# Patient Record
Sex: Female | Born: 1945 | Race: White | Hispanic: No | Marital: Married | Smoking: Never smoker
Health system: Southern US, Community
[De-identification: ages and names within clinical notes are randomized; demographics above are authoritative.]

## PROBLEM LIST (undated history)

## (undated) DIAGNOSIS — M8580 Other specified disorders of bone density and structure, unspecified site: Secondary | ICD-10-CM

## (undated) DIAGNOSIS — E785 Hyperlipidemia, unspecified: Secondary | ICD-10-CM

## (undated) DIAGNOSIS — K219 Gastro-esophageal reflux disease without esophagitis: Secondary | ICD-10-CM

## (undated) DIAGNOSIS — K635 Polyp of colon: Secondary | ICD-10-CM

## (undated) HISTORY — DX: Gastro-esophageal reflux disease without esophagitis: K21.9

## (undated) HISTORY — DX: Hyperlipidemia, unspecified: E78.5

## (undated) HISTORY — DX: Polyp of colon: K63.5

## (undated) HISTORY — DX: Other specified disorders of bone density and structure, unspecified site: M85.80

## (undated) HISTORY — PX: COLONOSCOPY W/ POLYPECTOMY: SHX1380

---

## 1988-08-21 HISTORY — PX: VESICOVAGINAL FISTULA CLOSURE W/ TAH: SUR271

## 1996-02-07 ENCOUNTER — Encounter: Payer: Self-pay | Admitting: Internal Medicine

## 1996-02-11 ENCOUNTER — Encounter: Payer: Self-pay | Admitting: Internal Medicine

## 1996-02-11 DIAGNOSIS — K5730 Diverticulosis of large intestine without perforation or abscess without bleeding: Secondary | ICD-10-CM | POA: Insufficient documentation

## 1996-02-11 DIAGNOSIS — K219 Gastro-esophageal reflux disease without esophagitis: Secondary | ICD-10-CM | POA: Insufficient documentation

## 1996-02-11 HISTORY — DX: Gastro-esophageal reflux disease without esophagitis: K21.9

## 1996-02-11 HISTORY — DX: Diverticulosis of large intestine without perforation or abscess without bleeding: K57.30

## 1999-02-28 ENCOUNTER — Other Ambulatory Visit: Admission: RE | Admit: 1999-02-28 | Discharge: 1999-02-28 | Payer: Self-pay | Admitting: Obstetrics & Gynecology

## 1999-12-16 ENCOUNTER — Encounter: Payer: Self-pay | Admitting: Obstetrics & Gynecology

## 1999-12-16 ENCOUNTER — Encounter: Admission: RE | Admit: 1999-12-16 | Discharge: 1999-12-16 | Payer: Self-pay | Admitting: Obstetrics & Gynecology

## 2000-02-20 ENCOUNTER — Encounter: Payer: Self-pay | Admitting: Internal Medicine

## 2000-04-04 ENCOUNTER — Encounter: Payer: Self-pay | Admitting: Internal Medicine

## 2000-04-26 ENCOUNTER — Encounter: Payer: Self-pay | Admitting: Internal Medicine

## 2001-01-01 ENCOUNTER — Encounter: Payer: Self-pay | Admitting: Obstetrics & Gynecology

## 2001-01-01 ENCOUNTER — Encounter: Admission: RE | Admit: 2001-01-01 | Discharge: 2001-01-01 | Payer: Self-pay | Admitting: Obstetrics & Gynecology

## 2001-01-22 ENCOUNTER — Encounter: Payer: Self-pay | Admitting: Internal Medicine

## 2001-01-22 ENCOUNTER — Encounter: Admission: RE | Admit: 2001-01-22 | Discharge: 2001-01-22 | Payer: Self-pay | Admitting: *Deleted

## 2001-02-06 ENCOUNTER — Ambulatory Visit: Admission: RE | Admit: 2001-02-06 | Discharge: 2001-02-06 | Payer: Self-pay | Admitting: Internal Medicine

## 2001-12-23 ENCOUNTER — Encounter: Payer: Self-pay | Admitting: Internal Medicine

## 2001-12-23 ENCOUNTER — Encounter: Admission: RE | Admit: 2001-12-23 | Discharge: 2001-12-23 | Payer: Self-pay | Admitting: Internal Medicine

## 2002-01-06 ENCOUNTER — Encounter: Admission: RE | Admit: 2002-01-06 | Discharge: 2002-01-06 | Payer: Self-pay | Admitting: Obstetrics & Gynecology

## 2002-01-06 ENCOUNTER — Encounter: Payer: Self-pay | Admitting: Obstetrics & Gynecology

## 2003-01-12 ENCOUNTER — Encounter: Payer: Self-pay | Admitting: Obstetrics & Gynecology

## 2003-01-12 ENCOUNTER — Encounter: Admission: RE | Admit: 2003-01-12 | Discharge: 2003-01-12 | Payer: Self-pay | Admitting: Obstetrics & Gynecology

## 2004-06-15 ENCOUNTER — Other Ambulatory Visit: Admission: RE | Admit: 2004-06-15 | Discharge: 2004-06-15 | Payer: Self-pay | Admitting: Obstetrics & Gynecology

## 2004-07-22 ENCOUNTER — Ambulatory Visit: Payer: Self-pay | Admitting: Internal Medicine

## 2004-08-05 ENCOUNTER — Ambulatory Visit: Payer: Self-pay | Admitting: Internal Medicine

## 2004-08-18 ENCOUNTER — Ambulatory Visit: Payer: Self-pay | Admitting: Internal Medicine

## 2004-12-27 ENCOUNTER — Ambulatory Visit: Payer: Self-pay | Admitting: Internal Medicine

## 2005-02-07 ENCOUNTER — Ambulatory Visit: Payer: Self-pay | Admitting: Internal Medicine

## 2005-02-15 ENCOUNTER — Ambulatory Visit: Payer: Self-pay | Admitting: Internal Medicine

## 2005-03-28 ENCOUNTER — Ambulatory Visit: Payer: Self-pay | Admitting: Internal Medicine

## 2005-04-26 ENCOUNTER — Ambulatory Visit: Payer: Self-pay | Admitting: Internal Medicine

## 2005-12-08 ENCOUNTER — Ambulatory Visit: Payer: Self-pay | Admitting: Internal Medicine

## 2005-12-13 ENCOUNTER — Ambulatory Visit: Payer: Self-pay | Admitting: Internal Medicine

## 2006-02-07 ENCOUNTER — Ambulatory Visit: Payer: Self-pay | Admitting: Internal Medicine

## 2006-02-12 ENCOUNTER — Ambulatory Visit: Payer: Self-pay | Admitting: Internal Medicine

## 2006-04-09 ENCOUNTER — Ambulatory Visit: Payer: Self-pay | Admitting: Internal Medicine

## 2006-04-10 ENCOUNTER — Ambulatory Visit: Payer: Self-pay | Admitting: Internal Medicine

## 2006-05-22 ENCOUNTER — Ambulatory Visit: Payer: Self-pay | Admitting: Internal Medicine

## 2007-01-02 ENCOUNTER — Encounter: Payer: Self-pay | Admitting: Internal Medicine

## 2007-01-02 ENCOUNTER — Ambulatory Visit: Payer: Self-pay | Admitting: Internal Medicine

## 2007-02-01 ENCOUNTER — Telehealth: Payer: Self-pay | Admitting: Internal Medicine

## 2007-02-04 ENCOUNTER — Ambulatory Visit: Payer: Self-pay | Admitting: Internal Medicine

## 2007-02-05 ENCOUNTER — Encounter: Payer: Self-pay | Admitting: Internal Medicine

## 2007-02-06 ENCOUNTER — Encounter: Payer: Self-pay | Admitting: Internal Medicine

## 2007-03-13 ENCOUNTER — Ambulatory Visit: Payer: Self-pay | Admitting: Internal Medicine

## 2007-05-09 ENCOUNTER — Ambulatory Visit: Payer: Self-pay | Admitting: Internal Medicine

## 2007-05-16 ENCOUNTER — Telehealth (INDEPENDENT_AMBULATORY_CARE_PROVIDER_SITE_OTHER): Payer: Self-pay | Admitting: *Deleted

## 2007-09-12 ENCOUNTER — Telehealth (INDEPENDENT_AMBULATORY_CARE_PROVIDER_SITE_OTHER): Payer: Self-pay | Admitting: *Deleted

## 2007-09-20 ENCOUNTER — Ambulatory Visit (HOSPITAL_COMMUNITY): Admission: RE | Admit: 2007-09-20 | Discharge: 2007-09-20 | Payer: Self-pay | Admitting: Internal Medicine

## 2007-09-27 ENCOUNTER — Telehealth (INDEPENDENT_AMBULATORY_CARE_PROVIDER_SITE_OTHER): Payer: Self-pay | Admitting: *Deleted

## 2007-10-08 ENCOUNTER — Ambulatory Visit: Payer: Self-pay | Admitting: Internal Medicine

## 2007-11-18 ENCOUNTER — Ambulatory Visit: Payer: Self-pay | Admitting: Internal Medicine

## 2007-11-28 ENCOUNTER — Telehealth (INDEPENDENT_AMBULATORY_CARE_PROVIDER_SITE_OTHER): Payer: Self-pay | Admitting: *Deleted

## 2008-04-30 ENCOUNTER — Encounter: Payer: Self-pay | Admitting: Internal Medicine

## 2008-05-18 ENCOUNTER — Encounter: Payer: Self-pay | Admitting: Internal Medicine

## 2008-05-18 ENCOUNTER — Ambulatory Visit: Payer: Self-pay | Admitting: Family Medicine

## 2008-06-16 ENCOUNTER — Encounter (INDEPENDENT_AMBULATORY_CARE_PROVIDER_SITE_OTHER): Payer: Self-pay | Admitting: *Deleted

## 2008-07-29 ENCOUNTER — Ambulatory Visit: Payer: Self-pay | Admitting: Internal Medicine

## 2008-11-12 ENCOUNTER — Ambulatory Visit: Payer: Self-pay | Admitting: Internal Medicine

## 2008-11-17 ENCOUNTER — Telehealth (INDEPENDENT_AMBULATORY_CARE_PROVIDER_SITE_OTHER): Payer: Self-pay | Admitting: *Deleted

## 2008-11-26 ENCOUNTER — Ambulatory Visit: Payer: Self-pay | Admitting: Internal Medicine

## 2008-11-29 LAB — CONVERTED CEMR LAB
Cholesterol: 211 mg/dL — ABNORMAL HIGH (ref 0–200)
HDL: 59.8 mg/dL (ref >39.00)
Triglycerides: 75 mg/dL (ref 0.0–149.0)
VLDL: 15 mg/dL (ref 0.0–40.0)

## 2008-11-30 ENCOUNTER — Encounter (INDEPENDENT_AMBULATORY_CARE_PROVIDER_SITE_OTHER): Payer: Self-pay | Admitting: *Deleted

## 2009-04-02 ENCOUNTER — Telehealth (INDEPENDENT_AMBULATORY_CARE_PROVIDER_SITE_OTHER): Payer: Self-pay | Admitting: *Deleted

## 2009-06-11 ENCOUNTER — Encounter: Payer: Self-pay | Admitting: Internal Medicine

## 2009-06-14 ENCOUNTER — Telehealth: Payer: Self-pay | Admitting: Internal Medicine

## 2009-06-15 ENCOUNTER — Telehealth (INDEPENDENT_AMBULATORY_CARE_PROVIDER_SITE_OTHER): Payer: Self-pay | Admitting: *Deleted

## 2009-07-01 ENCOUNTER — Encounter: Payer: Self-pay | Admitting: Internal Medicine

## 2009-07-06 ENCOUNTER — Encounter (INDEPENDENT_AMBULATORY_CARE_PROVIDER_SITE_OTHER): Payer: Self-pay | Admitting: *Deleted

## 2009-07-12 ENCOUNTER — Ambulatory Visit (HOSPITAL_COMMUNITY): Admission: RE | Admit: 2009-07-12 | Discharge: 2009-07-12 | Payer: Self-pay | Admitting: General Surgery

## 2009-08-02 ENCOUNTER — Ambulatory Visit (HOSPITAL_COMMUNITY): Admission: RE | Admit: 2009-08-02 | Discharge: 2009-08-02 | Payer: Self-pay | Admitting: General Surgery

## 2009-08-02 ENCOUNTER — Encounter: Payer: Self-pay | Admitting: Internal Medicine

## 2009-08-02 ENCOUNTER — Ambulatory Visit: Payer: Self-pay | Admitting: Internal Medicine

## 2009-08-02 DIAGNOSIS — M8580 Other specified disorders of bone density and structure, unspecified site: Secondary | ICD-10-CM | POA: Insufficient documentation

## 2009-08-02 DIAGNOSIS — I10 Essential (primary) hypertension: Secondary | ICD-10-CM

## 2009-08-02 DIAGNOSIS — E785 Hyperlipidemia, unspecified: Secondary | ICD-10-CM | POA: Insufficient documentation

## 2009-08-02 DIAGNOSIS — R1013 Epigastric pain: Secondary | ICD-10-CM | POA: Insufficient documentation

## 2009-08-02 HISTORY — DX: Essential (primary) hypertension: I10

## 2009-08-21 HISTORY — PX: UPPER GASTROINTESTINAL ENDOSCOPY: SHX188

## 2009-09-07 ENCOUNTER — Telehealth (INDEPENDENT_AMBULATORY_CARE_PROVIDER_SITE_OTHER): Payer: Self-pay | Admitting: *Deleted

## 2009-09-16 ENCOUNTER — Telehealth: Payer: Self-pay | Admitting: Internal Medicine

## 2010-01-20 ENCOUNTER — Ambulatory Visit: Payer: Self-pay | Admitting: Internal Medicine

## 2010-03-08 ENCOUNTER — Encounter: Payer: Self-pay | Admitting: Internal Medicine

## 2010-04-15 ENCOUNTER — Encounter (INDEPENDENT_AMBULATORY_CARE_PROVIDER_SITE_OTHER): Payer: Self-pay | Admitting: *Deleted

## 2010-04-20 ENCOUNTER — Ambulatory Visit: Payer: Self-pay | Admitting: Internal Medicine

## 2010-05-05 ENCOUNTER — Ambulatory Visit: Payer: Self-pay | Admitting: Internal Medicine

## 2010-05-09 ENCOUNTER — Telehealth (INDEPENDENT_AMBULATORY_CARE_PROVIDER_SITE_OTHER): Payer: Self-pay | Admitting: *Deleted

## 2010-05-12 ENCOUNTER — Encounter: Payer: Self-pay | Admitting: Internal Medicine

## 2010-05-18 ENCOUNTER — Ambulatory Visit: Payer: Self-pay | Admitting: Internal Medicine

## 2010-05-18 LAB — CONVERTED CEMR LAB
OCCULT 1: NEGATIVE
OCCULT 2: NEGATIVE
OCCULT 3: NEGATIVE

## 2010-05-19 ENCOUNTER — Encounter (INDEPENDENT_AMBULATORY_CARE_PROVIDER_SITE_OTHER): Payer: Self-pay | Admitting: *Deleted

## 2010-05-19 LAB — CONVERTED CEMR LAB
Amylase: 56 units/L (ref 27–131)
Basophils Relative: 0.9 % (ref 0.0–3.0)
Bilirubin, Direct: 0.1 mg/dL (ref 0.0–0.3)
Eosinophils Absolute: 0.2 10*3/uL (ref 0.0–0.7)
H Pylori IgG: NEGATIVE
HCT: 42.1 % (ref 36.0–46.0)
Hemoglobin: 14.2 g/dL (ref 12.0–15.0)
Lipase: 42 units/L (ref 11.0–59.0)
Lymphocytes Relative: 34.1 % (ref 12.0–46.0)
MCHC: 33.8 g/dL (ref 30.0–36.0)
MCV: 97.5 fL (ref 78.0–100.0)
Neutro Abs: 2.3 10*3/uL (ref 1.4–7.7)
RBC: 4.32 M/uL (ref 3.87–5.11)
Total Bilirubin: 0.9 mg/dL (ref 0.3–1.2)
Total Protein: 6.8 g/dL (ref 6.0–8.3)

## 2010-06-29 ENCOUNTER — Ambulatory Visit: Payer: Self-pay | Admitting: Internal Medicine

## 2010-06-29 DIAGNOSIS — R1310 Dysphagia, unspecified: Secondary | ICD-10-CM | POA: Insufficient documentation

## 2010-06-30 ENCOUNTER — Ambulatory Visit: Payer: Self-pay | Admitting: Internal Medicine

## 2010-06-30 DIAGNOSIS — H6980 Other specified disorders of Eustachian tube, unspecified ear: Secondary | ICD-10-CM | POA: Insufficient documentation

## 2010-07-01 ENCOUNTER — Telehealth: Payer: Self-pay | Admitting: Internal Medicine

## 2010-07-06 ENCOUNTER — Encounter: Payer: Self-pay | Admitting: Internal Medicine

## 2010-07-06 ENCOUNTER — Telehealth: Payer: Self-pay | Admitting: Internal Medicine

## 2010-07-19 ENCOUNTER — Ambulatory Visit: Payer: Self-pay | Admitting: Internal Medicine

## 2010-07-20 ENCOUNTER — Telehealth: Payer: Self-pay | Admitting: Internal Medicine

## 2010-08-26 ENCOUNTER — Ambulatory Visit
Admission: RE | Admit: 2010-08-26 | Discharge: 2010-08-26 | Payer: Self-pay | Source: Home / Self Care | Attending: Internal Medicine | Admitting: Internal Medicine

## 2010-09-13 ENCOUNTER — Ambulatory Visit
Admission: RE | Admit: 2010-09-13 | Discharge: 2010-09-13 | Payer: Self-pay | Source: Home / Self Care | Attending: Internal Medicine | Admitting: Internal Medicine

## 2010-09-13 DIAGNOSIS — J069 Acute upper respiratory infection, unspecified: Secondary | ICD-10-CM | POA: Insufficient documentation

## 2010-09-20 ENCOUNTER — Telehealth: Payer: Self-pay | Admitting: Internal Medicine

## 2010-09-22 NOTE — Procedures (Signed)
Summary: EGD   EGD  Procedure date:  12/13/2005  Findings:      Location: [facility]  Findings: Esophagitis  GERD  EGD  Procedure date:  12/13/2005  Findings:      Location: [facility]  Findings: Esophagitis  GERD Patient Name: Ruth Jones, Ruth Jones MRN:  Procedure Procedures: Panendoscopy (EGD) CPT: 43235.  Personnel: Endoscopist: Wilhemina Bonito. Marina Goodell, MD.  Exam Location: Exam performed in Outpatient Clinic. Outpatient  Patient Consent: Procedure, Alternatives, Risks and Benefits discussed, consent obtained, from patient. Consent was obtained by the RN.  Indications Symptoms: Pulmonary symptoms, including:  Hoarseness. Abdominal pain, location: epigastric. Reflux symptoms  Comments: Symptoms of epigastric burning at night and intermittent hoarseness despite bid Nexium and reflux precautions History  Current Medications: Patient is not currently taking Coumadin.  Pre-Exam Physical: Performed Dec 13, 2005  Cardio-pulmonary exam, Abdominal exam, Mental status exam WNL.  Comments: Pt. history reviewed/updated, physical exam performed prior to initiation of sedation?yes Exam Exam Info: Maximum depth of insertion Duodenum, intended Duodenum. Patient position: on left side. Vocal cords visualized. Gastric retroflexion performed. Images taken. ASA Classification: II. Tolerance: excellent.  Sedation Meds: Patient assessed and found to be appropriate for moderate (conscious) sedation. Fentanyl 50 mcg. given IV. Versed 5 mg. given IV. Cetacaine Spray given aerosolized.  Monitoring: BP and pulse monitoring done. Oximetry used. Supplemental O2 given  Findings - Normal: Proximal Esophagus to Distal Esophagus.  ESOPHAGEAL INFLAMMATION: as a result of reflux. Severity is mild, erythema only.  ICD9: Esophagitis, Reflux: 530.11.  - Normal: Fundus to Duodenal 2nd Portion. Comments: small H/H.   Assessment  Diagnoses: 530.11: Esophagitis, Reflux.    530.81: GERD.   Events  Unplanned Intervention: No unplanned interventions were required.  Unplanned Events: There were no complications. Plans Medication(s): PPI: Esomeprazole/Nexium 40 mg BID,  H2Blocker: Ranitidine/Zantac 300 mg HS,   Disposition: After procedure patient sent to recovery. After recovery patient sent home.  Scheduling: Call office for appointment, to Wilhemina Bonito. Marina Goodell, MD, in about 6 weeks   Comments: If pain worsens in interim,would schedule abdominal ultrasound  This report was created from the original endoscopy report, which was reviewed and signed by the above listed endoscopist.   cc:  [name], MD      The Patient

## 2010-09-22 NOTE — Progress Notes (Signed)
Summary: Triage  Phone Note Call from Patient   Caller: Bernie  Dr. Johnna Acosta [phone] Call For: Dr. Marina Goodell Reason for Call: Talk to Nurse Summary of Call: Needs to discuss the Esophageal Manometry on this patient Initial call taken by: Karna Christmas,  September 16, 2009 9:30 AM  Follow-up for Phone Call        Copy of interpretation faxed yesterday and Cyndra Numbers just now checked her box and it is there so she will give it toi Dr.Rosenbower. Follow-up by: Teryl Lucy RN,  September 16, 2009 10:27 AM

## 2010-09-22 NOTE — Procedures (Signed)
Summary: EGD/[facility]  EGD/[facility]   Imported By: Sherian Rein 06/30/2010 11:45:59  _____________________________________________________________________  External Attachment:    Type:   Image     Comment:   External Document

## 2010-09-22 NOTE — Procedures (Signed)
Summary: EGD/[facility]  EGD/[facility]   Imported By: Sherian Rein 06/30/2010 11:53:27  _____________________________________________________________________  External Attachment:    Type:   Image     Comment:   External Document

## 2010-09-22 NOTE — Progress Notes (Signed)
Summary: Prednisone rx  Phone Note Call from Patient Call back at Home Phone [phone]   Summary of Call: Patient called stating that she did not get her prednisone prescription from yesterday. It was to be faxed but the pharmacy denies receiving it. Patient aware re-faxed for her. Initial call taken by: Lucious Groves CMA,  July 01, 2010 9:38 AM    Prescriptions: PREDNISONE 20 MG TABS (PREDNISONE) 1 two times a day with food  #10 x 0   Entered by:   Lucious Groves CMA   Authorized by:   [name] MD   Signed by:   Lucious Groves CMA on 07/01/2010   Method used:   Electronically to        [id number] (retail)       [address]       [place], [place]  [address]       Ph: [phone]       Fax: [phone]   RxID:   [id number]

## 2010-09-22 NOTE — Assessment & Plan Note (Signed)
Summary: SINUS INFECTION/KN   Vital Signs:  Patient profile:   65 year old female Weight:      120.8 pounds BMI:     22.35 Temp:     98.1 degrees F oral Pulse rate:   72 / minute Resp:     14 per minute BP sitting:   110 / 68  (left arm) Cuff size:   regular  Vitals Entered By: [name] CMA (August 26, 2010 4:19 PM) CC: 1.)Sinus Infection    2.) Ongoing ear concerns , URI symptoms   Primary Care Provider:  [name], MD  CC:  1.)Sinus Infection    2.) Ongoing ear concerns  and URI symptoms.  History of Present Illness: Intermittent laryngitis in late 07/2010 followed by frontal & anterior crown headaches in am, better with Neti pot.Nasal secretions exhibit purulence  since 08/22/2010. The patient now  reports nasal congestion and productive cough, but denies sore throat and earache.  The patient denies fever, dyspnea, and wheezing.  The patient denies the following risk factors for Strep sinusitis:   facial pain, tooth pain, and tender adenopathy.   Current Medications (verified): 1)  Solgar Calmag With Vit D .... Take As Directed 2)  One-A-Day Womens Formula  Tabs (Multiple Vitamins-Calcium) .... Take 1 Tablet By Mouth Once A Day 3)  Calcium Citrate 200 Mg Tabs (Calcium Citrate) .... Take 7 Tabs Daily (In Divided Doses) 4)  Vivelle-Dot 0.0375 Mg/24hr Pttw (Estradiol) .... Remove and Apply New Patch Twice A Week 5)  Align  Caps (Probiotic Product) .... Take 1 Capsule By Mouth Once A Day 6)  Dexilant 60 Mg Cpdr (Dexlansoprazole) .Marland Kitchen.. 1 Each Am 7)  Losartan Potassium 50 Mg Tabs (Losartan Potassium) .Marland Kitchen.. 1 Once Daily 8)  Vitamin D 1000 Unit Tabs (Cholecalciferol) .... Take 1 Tablet By Mouth Once A Day 9)  Glucosamine 1500 Complex  Caps (Glucosamine-Chondroit-Vit C-Mn) .... Take 1 Capsule By Mouth Once A Day 10)  Nasonex 50 Mcg/act Susp (Mometasone Furoate) .Marland Kitchen.. 1 Spray Two Times A Day As Needed  Allergies: 1)  ! Levaquin 2)  ! * Toprol Xl 3)  ! Sulfa 4)  !  Pcn  Physical Exam  General:  well-nourished,in no acute distress; alert,appropriate and cooperative throughout examination Ears:  External ear exam shows no significant lesions or deformities.  Otoscopic examination reveals clear canals, tympanic membranes are intact bilaterally without bulging, retraction, inflammation or discharge but they are dull, L > R. Hearing is grossly normal bilaterally. Nose:  External nasal examination shows no deformity or inflammation. Nasal mucosa are  dry & erythematous  without lesions or exudates. Septum deviated to L. Hyponasal speech Mouth:  Oral mucosa and oropharynx without lesions or exudates.  Teeth in good repair. Lungs:  Normal respiratory effort, chest expands symmetrically. Lungs are clear to auscultation, no crackles or wheezes. Cervical Nodes:  No lymphadenopathy noted Axillary Nodes:  No palpable lymphadenopathy   Impression & Recommendations:  Problem # 1:  SINUSITIS- ACUTE-NOS (ICD-461.9)  The following medications were removed from the medication list:    Nasonex 50 Mcg/act Susp (Mometasone furoate) .Marland Kitchen... 1 spray two times a day as needed Her updated medication list for this problem includes:    Cefuroxime Axetil 500 Mg Tabs (Cefuroxime axetil) .Marland Kitchen... 1 two times a day    Fluticasone Propionate 50 Mcg/act Susp (Fluticasone propionate) .Marland Kitchen... 1 spray once daily - two times a day as needed  Problem # 2:  BRONCHITIS-ACUTE (ICD-466.0)  Her updated medication list for  this problem includes:    Cefuroxime Axetil 500 Mg Tabs (Cefuroxime axetil) .Marland Kitchen... 1 two times a day  Complete Medication List: 1)  Solgar Calmag With Vit D  .... Take as directed 2)  One-a-day Womens Formula Tabs (Multiple vitamins-calcium) .... Take 1 tablet by mouth once a day 3)  Calcium Citrate 200 Mg Tabs (Calcium citrate) .... Take 7 tabs daily (in divided doses) 4)  Vivelle-dot 0.0375 Mg/24hr Pttw (Estradiol) .... Remove and apply new patch twice a week 5)  Align Caps  (Probiotic product) .... Take 1 capsule by mouth once a day 6)  Dexilant 60 Mg Cpdr (Dexlansoprazole) .Marland Kitchen.. 1 each am 7)  Losartan Potassium 50 Mg Tabs (Losartan potassium) .Marland Kitchen.. 1 once daily 8)  Vitamin D 1000 Unit Tabs (Cholecalciferol) .... Take 1 tablet by mouth once a day 9)  Glucosamine 1500 Complex Caps (Glucosamine-chondroit-vit c-mn) .... Take 1 capsule by mouth once a day 10)  Cefuroxime Axetil 500 Mg Tabs (Cefuroxime axetil) .Marland Kitchen.. 1 two times a day 11)  Fluticasone Propionate 50 Mcg/act Susp (Fluticasone propionate) .Marland Kitchen.. 1 spray once daily - two times a day as needed  Patient Instructions: 1)  Neti pot once daily- two times a day as needed  followed by generic Flonase. 2)  Drink as much NON dairy  fluid as you can tolerate for the next few days. Prescriptions: FLUTICASONE PROPIONATE 50 MCG/ACT SUSP (FLUTICASONE PROPIONATE) 1 spray once daily - two times a day as needed  #1 x 11   Entered and Authorized by:   [name] MD   Signed by:   [name] MD on 08/26/2010   Method used:   Print then Give to Patient   RxID:   [id number] CEFUROXIME AXETIL 500 MG TABS (CEFUROXIME AXETIL) 1 two times a day  #20 x 0   Entered and Authorized by:   [name] MD   Signed by:   [name] MD on 08/26/2010   Method used:   Electronically to        [id number] (retail)       [address]       [place], [place]  [address]       Ph: [phone]       Fax: [phone]   RxID:   [id number]    Orders Added: 1)  Est. Patient Level III [[address]]

## 2010-09-22 NOTE — Letter (Signed)
Summary: Colonoscopy Letter  [facility]  [address] [place], [place] [address]   Phone: [phone]  Fax: [phone]      March 08, 2010 MRN: [id number]   STELLAR GENSEL [address] [place], [place]  [address]   Dear Ms. Merk,   According to your medical record, it is time for you to schedule a Colonoscopy. The American Cancer Society recommends this procedure as a method to detect early colon cancer. Patients with a family history of colon cancer, or a personal history of colon polyps or inflammatory bowel disease are at increased risk.  This letter has been generated based on the recommendations made at the time of your procedure. If you feel that in your particular situation this may no longer apply, please contact our office.  Please call our office at [phone] to schedule this appointment or to update your records at your earliest convenience.  Thank you for cooperating with [place] to provide you with the very best care possible.   Sincerely,  Wilhemina Bonito. Marina Goodell, M.D.  [facility] [phone]

## 2010-09-22 NOTE — Miscellaneous (Signed)
Summary: [facility] Previsit/prep  Clinical Lists Changes  Medications: Added new medication of MOVIPREP 100 GM  SOLR (PEG-KCL-NACL-NASULF-NA ASC-C) As per prep instructions. - Signed Rx of MOVIPREP 100 GM  SOLR (PEG-KCL-NACL-NASULF-NA ASC-C) As per prep instructions.;  #1 x 0;  Signed;  Entered by: Wyona Almas RN;  Authorized by: Hilarie Fredrickson MD;  Method used: Electronically to [facility] [phone]*, [address] [place], [place], [place]  [address], Ph: [phone] or [phone], Fax: [phone] Allergies: Changed allergy or adverse reaction from [facility] to [facility] Changed allergy or adverse reaction from * TOPROL XL to * TOPROL XL Changed allergy or adverse reaction from SULFA to SULFA Observations: Added new observation of ALLERGY REV: Done (04/20/2010 15:59)    Prescriptions: MOVIPREP 100 GM  SOLR (PEG-KCL-NACL-NASULF-NA ASC-C) As per prep instructions.  #1 x 0   Entered by:   Wyona Almas RN   Authorized by:   Hilarie Fredrickson MD   Signed by:   Wyona Almas RN on 04/20/2010   Method used:   Electronically to        [facility] (retail)       [address]       [place], [place]  [address]       Ph: [phone] or [phone]       Fax: [phone]   RxID:   [id number]

## 2010-09-22 NOTE — Letter (Signed)
Summary: [facility] Instructions  [facility]  [address] [place], [place] [address]   Phone: [phone]  Fax: [phone]       Ruth Jones    05-04-1946    MRN: [id number]        Procedure Day /Date: Thursday   05-05-10     Arrival Time:  7:30 a.m.     Procedure Time:  8:30 a.m.     Location of Procedure:                    _x_  [facility] (4th Floor)                        PREPARATION FOR COLONOSCOPY WITH MOVIPREP   Starting 5 days prior to your procedure  04-30-10  do not eat nuts, seeds, popcorn, corn, beans, peas,  salads, or any raw vegetables.  Do not take any fiber supplements (e.g. Metamucil, Citrucel, and Benefiber).  THE DAY BEFORE YOUR PROCEDURE         DATE:  05-04-10  DAY: Wednesday  1.  Drink clear liquids the entire day-NO SOLID FOOD  2.  Do not drink anything colored red or purple.  Avoid juices with pulp.  No orange juice.  3.  Drink at least 64 oz. (8 glasses) of fluid/clear liquids during the day to prevent dehydration and help the prep work efficiently.  CLEAR LIQUIDS INCLUDE: Water Jello Ice Popsicles Tea (sugar ok, no milk/cream) Powdered fruit flavored drinks Coffee (sugar ok, no milk/cream) Gatorade Juice: apple, white grape, white cranberry  Lemonade Clear bullion, consomm, broth Carbonated beverages (any kind) Strained chicken noodle soup Hard Candy                             4.  In the morning, mix first dose of MoviPrep solution:    Empty 1 Pouch A and 1 Pouch B into the disposable container    Add lukewarm drinking water to the top line of the container. Mix to dissolve    Refrigerate (mixed solution should be used within 24 hrs)  5.  Begin drinking the prep at 5:00 p.m. The MoviPrep container is divided by 4 marks.   Every 15 minutes drink the solution down to the next mark (approximately 8 oz) until the full liter is complete.   6.  Follow completed prep with 16 oz of clear liquid of your  choice (Nothing red or purple).  Continue to drink clear liquids until bedtime.  7.  Before going to bed, mix second dose of MoviPrep solution:    Empty 1 Pouch A and 1 Pouch B into the disposable container    Add lukewarm drinking water to the top line of the container. Mix to dissolve    Refrigerate  THE DAY OF YOUR PROCEDURE      DATE:  05-05-10  DAY: Thursday  Beginning at 3:30 a.m. (5 hours before procedure):         1. Every 15 minutes, drink the solution down to the next mark (approx 8 oz) until the full liter is complete.  2. Follow completed prep with 16 oz. of clear liquid of your choice.    3. You may drink clear liquids until  6:30 a.m. (2 HOURS BEFORE PROCEDURE).   MEDICATION INSTRUCTIONS  Unless otherwise instructed, you should take regular prescription medications with a small sip of water  as early as possible the morning of your procedure.       OTHER INSTRUCTIONS  You will need a responsible adult at least 67 years of age to accompany you and drive you home.   This person must remain in the waiting room during your procedure.  Wear loose fitting clothing that is easily removed.  Leave jewelry and other valuables at home.  However, you may wish to bring a book to read or  an iPod/MP3 player to listen to music as you wait for your procedure to start.  Remove all body piercing jewelry and leave at home.  Total time from sign-in until discharge is approximately 2-3 hours.  You should go home directly after your procedure and rest.  You can resume normal activities the  day after your procedure.  The day of your procedure you should not:   Drive   Make legal decisions   Operate machinery   Drink alcohol   Return to work  You will receive specific instructions about eating, activities and medications before you leave.    The above instructions have been reviewed and explained to me by   Wyona Almas RN  April 20, 2010 4:46 PM     I  fully understand and can verbalize these instructions _____________________________ Date _________

## 2010-09-22 NOTE — Assessment & Plan Note (Signed)
Summary: chest congestion//lch   Vital Signs:  Patient profile:   65 year old female Weight:      124.6 pounds Temp:     98.7 degrees F oral Pulse rate:   76 / minute Resp:     17 per minute BP sitting:   120 / 70  (left arm) Cuff size:   regular  Vitals Entered By: [name] (January 20, 2010 4:44 PM) CC: Cough and congestion, seen at Urgent care and rx'ed cough med and Azithromycin 500mg  (3 tabs). Patient is no better, congestion still discolored Comments REVIEWED MED LIST, PATIENT AGREED DOSE AND INSTRUCTION CORRECT    CC:  Cough and congestion, seen at Urgent care and rx'ed cough med and Azithromycin 500mg  (3 tabs). Patient is no better, and congestion still discolored.  History of Present Illness: Despite Neti pot two times a day & course of Azithromycin & continued use of cough syrup  she has purulence from head.  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  Allergies: 1)  ! Levaquin 2)  ! * Toprol Xl 3)  ! Sulfa 4)  ! Pcn  Review of Systems General:  Denies chills, fever, and sweats. ENT:  Complains of postnasal drainage; denies ear discharge, earache, nasal congestion, and sinus pressure; No frontal headache, facila pain but green -yellow secretions. Resp:  Complains of cough and sputum productive; denies chest pain with inspiration, coughing up blood, pleuritic, shortness of breath, and wheezing; No PMH of asthma. Allergy:  Denies itching eyes and sneezing.  Physical Exam  General:  in no acute distress; alert,appropriate and cooperative throughout examination Ears:  External ear exam shows no significant lesions or deformities.  Otoscopic examination reveals clear canals, tympanic membranes are intact bilaterally without bulging, retraction, inflammation or discharge. Hearing is grossly normal bilaterally. Nose:  External nasal examination shows no deformity or inflammation. Nasal mucosa are  mildly erythematous & edematous L > R , without lesions or exudates. Mouth:  Oral  mucosa and oropharynx without lesions or exudates.  Teeth in good repair. Lungs:  Normal respiratory effort, chest expands symmetrically. Lungs are clear to auscultation, no crackles or wheezes,minimal rales. Heart:  regular rhythm, no murmur, no gallop, no rub, no JVD, and bradycardia.  S4 Cervical Nodes:  No lymphadenopathy noted Axillary Nodes:  No palpable lymphadenopathy   Impression & Recommendations:  Problem # 1:  SINUSITIS- ACUTE-NOS (ICD-461.9)  Her updated medication list for this problem includes:    Cefuroxime Axetil 500 Mg Tabs (Cefuroxime axetil) .Marland Kitchen... 1 two times a day  Problem # 2:  BRONCHITIS-ACUTE (ICD-466.0)  Her updated medication list for this problem includes:    Cefuroxime Axetil 500 Mg Tabs (Cefuroxime axetil) .Marland Kitchen... 1 two times a day    Advair Diskus 100-50 Mcg/dose Aepb (Fluticasone-salmeterol) .Marland Kitchen... 1 inhalation every 12 hrs ; gargle & spit after use  Complete Medication List: 1)  Altace 10 Mg Caps (Ramipril) .Marland Kitchen.. 1 by mouth qd 2)  Prevacid 30 Mg Cpdr (Lansoprazole) .Marland Kitchen.. 1 by mouth once daily 3)  Solgar Calmag With Vit D  .... Take as directed 4)  Mvi  5)  Calcium  6)  Estrogen .58  .... 2 x weekly 7)  Probiotic  .Marland Kitchen.. 1 by mouth once daily as needed 8)  Cefuroxime Axetil 500 Mg Tabs (Cefuroxime axetil) .Marland Kitchen.. 1 two times a day 9)  Fluconazole 150 Mg Tabs (Fluconazole) .Marland Kitchen.. 1 if needed 10)  Advair Diskus 100-50 Mcg/dose Aepb (Fluticasone-salmeterol) .Marland Kitchen.. 1 inhalation every 12 hrs ;  gargle & spit after use  Patient Instructions: 1)  Drink as much fluid as you can tolerate for the next few days. Prescriptions: ADVAIR DISKUS 100-50 MCG/DOSE AEPB (FLUTICASONE-SALMETEROL) 1 inhalation every 12 hrs ; gargle & spit after use  #1 x 0   Entered and Authorized by:   [name] MD   Signed by:   [name] MD on 01/20/2010   Method used:   Samples Given   RxID:   [id number] FLUCONAZOLE 150 MG TABS (FLUCONAZOLE) 1 if needed  #1 x 0   Entered and  Authorized by:   [name] MD   Signed by:   [name] MD on 01/20/2010   Method used:   Print then Give to Patient   RxID:   [id number] CEFUROXIME AXETIL 500 MG TABS (CEFUROXIME AXETIL) 1 two times a day  #20 x 0   Entered and Authorized by:   [name] MD   Signed by:   [name] MD on 01/20/2010   Method used:   Faxed to ...       [facility] (retail)       [address]       [place], [place]  [address]       Ph: [phone] or [phone]       Fax: [phone]   RxID:   [id number]

## 2010-09-22 NOTE — Medication Information (Signed)
Summary: Approved Dexilant / [facility]  Approved Dexilant / [facility]   Imported By: Lennie Odor 05/23/2010 12:12:06  _____________________________________________________________________  External Attachment:    Type:   Image     Comment:   External Document

## 2010-09-22 NOTE — Procedures (Signed)
Summary: Colonoscopy  Patient: Ruth Jones Note: All result statuses are Final unless otherwise noted.  Tests: (1) Colonoscopy (COL)   COL Colonoscopy           DONE     [facility]     [address]     [place], [place]  [address]           COLONOSCOPY PROCEDURE REPORT           PATIENT:  [name], [name]  MR#:  [id number]     BIRTHDATE:  Dec 10, 1945, 64 yrs. old  GENDER:  female     ENDOSCOPIST:  Wilhemina Bonito. Eda Keys, MD     REF. BY:  Office     PROCEDURE DATE:  07/19/2010     PROCEDURE:  Average-risk screening colonoscopy     G0121     ASA CLASS:  Class II     INDICATIONS:  Routine Risk Screening ; index exam 2001 negative     MEDICATIONS:   Fentanyl 75 mcg IV, Versed 7 mg IV           DESCRIPTION OF PROCEDURE:   After the risks benefits and     alternatives of the procedure were thoroughly explained, informed     consent was obtained.  Digital rectal exam was performed and     revealed no abnormalities.   The LB 180AL [id number] endoscope was     introduced through the anus and advanced to the cecum, which was     identified by both the appendix and ileocecal valve, without     limitations.Time to cecum = 7:17 min. The quality of the prep was     adequate, using MoviPrep.  The instrument was then slowly     withdrawn (time = 9:52 min) as the colon was fully examined.     <<PROCEDUREIMAGES>>           FINDINGS:  Severe diverticulosis was found in the left colon.     This was otherwise a normal examination of the colon.  No polyps or     cancers were seen.   Retroflexed views in the rectum revealed no     abnormalities.    The scope was then withdrawn from the patient     and the procedure completed.           COMPLICATIONS:  None     ENDOSCOPIC IMPRESSION:     1) Severe diverticulosis in the left colon     2) Otherwise normal examination     3) No polyps or cancers     RECOMMENDATIONS:     1) Continue current colorectal screening recommendations for     "routine  risk" patients with a repeat colonoscopy in 10 years.           ______________________________     Wilhemina Bonito. Eda Keys, MD           CC:  Pecola Lawless, MD;The Patient           n.     Rosalie DoctorWilhemina Bonito. Eda Keys at 07/19/2010 03:35 PM           Fredda Hammed, [id number]  Note: An exclamation mark (!) indicates a result that was not dispersed into the flowsheet. Document Creation Date: 07/19/2010 3:35 PM _______________________________________________________________________  (1) Order result status: Final Collection or observation date-time: 07/19/2010 15:28 Requested date-time:  Receipt date-time:  Reported date-time:  Referring Physician:   Ordering  Physician: Fransico Setters [phone]) Specimen Source:  Source: Launa Grill Order Number: [id number] Lab site:   Appended Document: Colonoscopy    Clinical Lists Changes  Observations: Added new observation of COLONNXTDUE: 06/2020 (07/19/2010 16:50)

## 2010-09-22 NOTE — Assessment & Plan Note (Signed)
Summary: reflux/cbs   Vital Signs:  Patient profile:   65 year old female Weight:      121.2 pounds BMI:     22.43 Temp:     98.7 degrees F oral Pulse rate:   72 / minute Resp:     14 per minute BP sitting:   122 / 80  (left arm) Cuff size:   regular  Vitals Entered By: [name] CMA (May 05, 2010 4:34 PM) CC: Reflux still keeping patient awake at night, Heartburn   CC:  Reflux still keeping patient awake at night and Heartburn.  History of Present Illness: Heartburn      This is a 65 year old woman who presents with Heartburn.  The patient reports acid reflux, sour taste in mouth, epigastric pain, and occasional  trouble swallowing, but denies chest pain, weight loss, and weight gain.  The patient reports the following alarm features of dyspepsia: occasional  dysphagia.  The patient denies the following alarm features: melena, hematemesis, vomiting, involuntary weight loss >5%, and history of anemia.  Symptoms have no trigger.Prior evaluation has included EGD in 2007: esophagitis.  Treatment that was tried and either found to be ineffective or stopped due to problems include a PPI up to twice a day.   Hypertension Follow-Up      The patient also presents for Hypertension follow-up.  The patient denies lightheadedness, urinary frequency, headaches, and edema.  The patient denies the following associated symptoms: exercise intolerance, dyspnea, and palpitations.  Compliance with medications (by patient report) has been near 100%.  Adjunctive measures currently used by the patient include salt restriction.  BP @ home not checked @ home . She has been on Altace ? 7-8 years. She describes intermittent  dry cough; no angioedema.  Current Medications (verified): 1)  Altace 10 Mg Caps (Ramipril) .Marland Kitchen.. 1 By Mouth Qd 2)  Solgar Calmag With Vit D .... Take As Directed 3)  Mvi 4)  Calcium 5)  Estrogen .49 .... 2 X Weekly 6)  Probiotic .Marland Kitchen.. 1 By Mouth Once Daily As Needed 7)  Advair  Diskus 100-50 Mcg/dose Aepb (Fluticasone-Salmeterol) .Marland Kitchen.. 1 Inhalation Every 12 Hrs ; Gargle & Spit After Use 8)  Moviprep 100 Gm  Solr (Peg-Kcl-Nacl-Nasulf-Na Asc-C) .... As Per Prep Instructions.  Allergies: 1)  ! Levaquin 2)  ! * Toprol Xl 3)  ! Sulfa 4)  ! Pcn  Physical Exam  General:  well-nourished,in no acute distress; alert,appropriate and cooperative throughout examination Eyes:  No corneal or conjunctival inflammation noted.No icterus Mouth:  Oral mucosa and oropharynx without lesions or exudates.  Teeth in good repair. No pharyngeal erythema.   Lungs:  Normal respiratory effort, chest expands symmetrically. Lungs are clear to auscultation, no crackles or wheezes. Heart:  Normal rate and regular rhythm. S1 and S2 normal without gallop, murmur, click, rub .S 4 Abdomen:  Bowel sounds positive,abdomen soft and non-tender without masses, organomegaly or hernias noted. Pulses:  R and L carotid,radial,dorsalis pedis and posterior tibial pulses are full and equal bilaterally Extremities:  No clubbing, cyanosis, edema. Skin:  Intact without suspicious lesions or rashes. No jaundice. Cervical Nodes:  No lymphadenopathy noted Axillary Nodes:  No palpable lymphadenopathy Psych:  memory intact for recent and remote, normally interactive, and good eye contact.     Impression & Recommendations:  Problem # 1:  GERD (ICD-530.81)  with occasional dysphagia The following medications were removed from the medication list:    Prevacid 30 Mg Cpdr (Lansoprazole) .Marland Kitchen... 1 by  mouth once daily Her updated medication list for this problem includes:    Dexilant 60 Mg Cpdr (Dexlansoprazole) .Marland Kitchen... 1 each am  Orders: Gastroenterology Referral (GI)  Problem # 2:  HYPERTENSION (ICD-401.9)  The following medications were removed from the medication list:    Altace 10 Mg Caps (Ramipril) .Marland Kitchen... 1 by mouth qd Her updated medication list for this problem includes:    Losartan Potassium 50 Mg Tabs  (Losartan potassium) .Marland Kitchen... 1 once daily  Complete Medication List: 1)  Solgar Calmag With Vit D  .... Take as directed 2)  Mvi  3)  Calcium  4)  Estrogen .62  .... 2 x weekly 5)  Probiotic  .Marland Kitchen.. 1 by mouth once daily as needed 6)  Advair Diskus 100-50 Mcg/dose Aepb (Fluticasone-salmeterol) .Marland Kitchen.. 1 inhalation every 12 hrs ; gargle & spit after use 7)  Moviprep 100 Gm Solr (Peg-kcl-nacl-nasulf-na asc-c) .... As per prep instructions. 8)  Dexilant 60 Mg Cpdr (Dexlansoprazole) .Marland Kitchen.. 1 each am 9)  Losartan Potassium 50 Mg Tabs (Losartan potassium) .Marland Kitchen.. 1 once daily  Patient Instructions: 1)  Avoid foods high in acid (tomatoes, citrus juices, spicy foods). Avoid eating within two hours of lying down or before exercising. Do not over eat; try smaller more frequent meals. Elevate head of bed twelve inches when sleeping.Complete stool cards. Please schedule labs:amylase.,lipase, H. pylori ,Hepatic Panel ; 2)  CBC w/ Diff. Prescriptions: LOSARTAN POTASSIUM 50 MG TABS (LOSARTAN POTASSIUM) 1 once daily  #30 x 5   Entered and Authorized by:   [name] MD   Signed by:   [name] MD on 05/05/2010   Method used:   Print then Give to Patient   RxID:   [id number] DEXILANT 60 MG CPDR (DEXLANSOPRAZOLE) 1 each am  #30 x 2   Entered and Authorized by:   [name] MD   Signed by:   [name] MD on 05/05/2010   Method used:   Print then Give to Patient   RxID:   [id number]

## 2010-09-22 NOTE — Progress Notes (Signed)
Summary: [employer] [employer]   Imported By: Sherian Rein 06/30/2010 12:10:42  _____________________________________________________________________  External Attachment:    Type:   Image     Comment:   External Document

## 2010-09-22 NOTE — Consult Note (Signed)
Summary: [facility]   Imported By: Sherian Rein 06/30/2010 12:06:21  _____________________________________________________________________  External Attachment:    Type:   Image     Comment:   External Document

## 2010-09-22 NOTE — Progress Notes (Signed)
Summary: Manometry reading  Phone Note From Other Clinic   Summary of Call: Call rec'd from Bernie at [facility] office and he would like Dr.Perry to interpret pt's manometry so Cyndra Numbers will mail it for  review. Initial call taken by: Teryl Lucy RN,  September 07, 2009 10:24 AM  Follow-up for Phone Call        put it in my in box Follow-up by: Hilarie Fredrickson MD,  September 07, 2009 10:39 AM

## 2010-09-22 NOTE — Progress Notes (Signed)
Summary: Dexilant  Phone Note Call from Patient Call back at Work Phone [phone] Call back at call affter 1:30PM   Caller: Patient Call For: Dr. Marina Goodell Reason for Call: Talk to Nurse Summary of Call: has just filled last refill of Dexilant and wanted Britta Mccreedy to know... apparently her ins is hard to work with Initial call taken by: Vallarie Mare,  July 20, 2010 10:20 AM  Follow-up for Phone Call        called and left message for patient to call me in the morning. Milford Cage [facility]  July 21, 2010 5:00 PM  Pt. just needed refill of her Dexilant.  Dr. Alwyn Ren only gave her 3 months.  It was approved until 04/2011.  I advised her I would give her 1 year refill on Rx.  to [employer]. Follow-up by: Milford Cage NCMA,  July 22, 2010 4:01 PM    Prescriptions: DEXILANT 60 MG CPDR (DEXLANSOPRAZOLE) 1 each am  #30 x 11   Entered by:   Milford Cage NCMA   Authorized by:   Hilarie Fredrickson MD   Signed by:   Milford Cage NCMA on 07/22/2010   Method used:   Electronically to        [id number] (retail)       [address]       [place], [place]  [address]       Ph: [phone]       Fax: [phone]   RxID:   [id number]

## 2010-09-22 NOTE — Medication Information (Signed)
Summary: Prior Authorization for Dexilant/[employer]  Prior Authorization for Dexilant/[employer]   Imported By: Lanelle Bal 05/20/2010 12:57:09  _____________________________________________________________________  External Attachment:    Type:   Image     Comment:   External Document

## 2010-09-22 NOTE — Progress Notes (Signed)
Summary: [facility] [employer]   Imported By: Sherian Rein 06/30/2010 12:09:10  _____________________________________________________________________  External Attachment:    Type:   Image     Comment:   External Document

## 2010-09-22 NOTE — Assessment & Plan Note (Signed)
Summary: Ongoing sinus concerns   Vital Signs:  Patient profile:   65 year old female Weight:      122 pounds BMI:     22.58 Temp:     99.7 degrees F oral Pulse rate:   64 / minute Resp:     14 per minute BP sitting:   106 / 70  (left arm) Cuff size:   regular  Vitals Entered By: [name] CMA (September 13, 2010 4:12 PM) CC: Ongoing sinus concerns, got better while taking ABX. Main concern is sinuses, other symptoms subsided, URI symptoms   Primary Care Provider:  [name], MD  CC:  Ongoing sinus concerns, got better while taking ABX. Main concern is sinuses, other symptoms subsided, and URI symptoms.  History of Present Illness:      This is a 65 year old woman who presents with recurrent URI symptoms.  The patient  now reports nasal congestion with  scant  purulent nasal discharge. She denies sore throat except from PNDrainage, productive cough, and earache. She is having pressure on L with low grade, constant "grrrrr",  "like a miniature vacuum cleaner". The patient denies fever, dyspnea,  wheezing,frontal  headache ,bilateral facial pain, tooth pain, and tender adenopathy. The ear pressure & hearing loss  got better with Cefuroxime.    Current Medications (verified): 1)  Solgar Calmag With Vit D .... Take As Directed 2)  One-A-Day Womens Formula  Tabs (Multiple Vitamins-Calcium) .... Take 1 Tablet By Mouth Once A Day 3)  Calcium Citrate 200 Mg Tabs (Calcium Citrate) .... Take 7 Tabs Daily (In Divided Doses) 4)  Vivelle-Dot 0.0375 Mg/24hr Pttw (Estradiol) .... Remove and Apply New Patch Twice A Week 5)  Align  Caps (Probiotic Product) .... Take 1 Capsule By Mouth Once A Day 6)  Dexilant 60 Mg Cpdr (Dexlansoprazole) .Marland Kitchen.. 1 Each Am 7)  Losartan Potassium 50 Mg Tabs (Losartan Potassium) .Marland Kitchen.. 1 Once Daily 8)  Vitamin D 1000 Unit Tabs (Cholecalciferol) .... Take 1 Tablet By Mouth Once A Day 9)  Glucosamine 1500 Complex  Caps (Glucosamine-Chondroit-Vit C-Mn) .... Take 1 Capsule  By Mouth Once A Day 10)  Fluticasone Propionate 50 Mcg/act Susp (Fluticasone Propionate) .Marland Kitchen.. 1 Spray Once Daily - Two Times A Day As Needed  Allergies: 1)  ! Levaquin 2)  ! * Toprol Xl 3)  ! Sulfa 4)  ! Pcn  Physical Exam  General:  well-nourished,in no acute distress; alert,appropriate and cooperative throughout examination Ears:  External ear exam shows no significant lesions or deformities.  Otoscopic examination reveals clear canals, tympanic membranes are intact bilaterally without bulging, retraction, inflammation or discharge. Hearing is grossly normal bilaterally. L TM  dull Nose:  External nasal examination shows no deformity or inflammation. Nasal mucosa are pink and moist without lesions or exudates. Mouth:  Oral mucosa and oropharynx without lesions or exudates.  Teeth in good repair. Lungs:  Normal respiratory effort, chest expands symmetrically. Lungs are clear to auscultation, no crackles or wheezes. 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 Take as directed 2)  One-a-day Womens Formula Tabs (Multiple vitamins-calcium) .... Take 1 tablet by mouth once a day 3)  Calcium Citrate 200 Mg Tabs (Calcium citrate) .... Take 7 tabs daily (in divided doses) 4)  Vivelle-dot  0.0375 Mg/24hr Pttw (Estradiol) .... Remove and apply new patch twice a week 5)  Align Caps (Probiotic product) .... Take 1 capsule by mouth once a day 6)  Dexilant 60 Mg Cpdr (Dexlansoprazole) .Marland Kitchen.. 1 each am 7)  Losartan Potassium 50 Mg Tabs (Losartan potassium) .Marland Kitchen.. 1 once daily 8)  Vitamin D 1000 Unit Tabs (Cholecalciferol) .... Take 1 tablet by mouth once a day 9)  Glucosamine 1500 Complex Caps (Glucosamine-chondroit-vit c-mn) .... Take 1 capsule by mouth once a day 10)   Fluticasone Propionate 50 Mcg/act Susp (Fluticasone propionate) .Marland Kitchen.. 1 spray once daily - two times a day as needed 11)  Metronidazole 500 Mg Tabs (Metronidazole) .Marland Kitchen.. 1 three times a day  Patient Instructions: 1)  Continue Neti pot two times a day as needed , followed by nasal spray. Prescriptions: METRONIDAZOLE 500 MG TABS (METRONIDAZOLE) 1 three times a day  #30 x 0   Entered and Authorized by:   [name] MD   Signed by:   [name] MD on 09/13/2010   Method used:   Electronically to        [id number] (retail)       [address]       [place], [place]  [address]       Ph: [phone]       Fax: [phone]   RxID:   [id number]    Orders Added: 1)  Est. Patient Level III [347[address]]

## 2010-09-22 NOTE — Consult Note (Signed)
Summary: [facility]  [facility]   Imported By: Lanelle Bal 08/01/2010 14:01:24  _____________________________________________________________________  External Attachment:    Type:   Image     Comment:   External Document

## 2010-09-22 NOTE — Assessment & Plan Note (Signed)
Summary: GERD AND OCCASIONAL DYSPHAGIA.Marland Kitchen   History of Present Illness Visit Type: Follow-up Visit Primary GI MD: Yancey Flemings MD Primary Provider: [name], MD Chief Complaint: GERD and solid food dysphagia.  Pt symptoms are getting better on Dexilant and after changing BP meds History of Present Illness:   65 year old female with hypertension, hyperlipidemia, osteopenia, and GERD. She presents today regarding what she believes to be worsening reflux symptoms and dysphagia. Also the need for followup screening colonoscopy. She was last seen in July 2008. She has had reflux disease with difficult to control regurgitation and pharyngeal burning despite PPI therapy. She was evaluated for but declined antireflux surgery. 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 Her current history is that of ongoing symptoms despite b.i.d. PPI. Several months ago she began to wean herself from Nexium 80 mg daily to 40 mg daily to none. She doesn't describe a sensation of "losing her case put in the back of her mouth", and stated that "food tasted differently in the back of her mouth". She also reported intermittent solid food dysphagia that she thinks began while she was on PPI but is not certain. She saw Dr. Clearance Coots and was placed on Dexilant 60 mg daily. She feels a lot of her symptoms have improved. She still has some burning, some regurgitation, and ongoing problems with chronic epigastric pain that awakens her at night and generally lasts 5-10 minutes. Previous abdominal ultrasound for the same pain was negative. Prior upper endoscopy in 1997 was normal while per endoscopy in 2001 demonstrated a peptic stricture and endoscopy in 2007 revealed mild esophagitis. Next, she underwent screening colonoscopy in September of 2001. This revealed left-sided diverticulosis but was otherwise normal. No family history of colon cancer. She denies lower GI complaints.   GI Review of Systems    Reports  acid reflux and  dysphagia with solids.      Denies abdominal pain, belching, bloating, chest pain, dysphagia with liquids, heartburn, loss of appetite, nausea, vomiting, vomiting blood, weight loss, and  weight gain.        Denies anal fissure, black tarry stools, change in bowel habit, constipation, diarrhea, diverticulosis, fecal incontinence, heme positive stool, hemorrhoids, irritable bowel syndrome, jaundice, light color stool, liver problems, rectal bleeding, and  rectal pain. Preventive Screening-Counseling & Management      Drug Use:  no.      Current Medications (verified): 1)  Solgar Calmag With Vit D .... Take As Directed 2)  One-A-Day Womens Formula  Tabs (Multiple Vitamins-Calcium) .... Take 1 Tablet By Mouth Once A Day 3)  Calcium Citrate 200 Mg Tabs (Calcium Citrate) .... Take 7 Tabs Daily (In Divided Doses) 4)  Vivelle-Dot 0.0375 Mg/24hr Pttw (Estradiol) .... Remove and Apply New Patch Twice A Week 5)  Align  Caps (Probiotic Product) .... Take 1 Capsule By Mouth Once A Day 6)  Dexilant 60 Mg Cpdr (Dexlansoprazole) .Marland Kitchen.. 1 Each Am 7)  Losartan Potassium 50 Mg Tabs (Losartan Potassium) .Marland Kitchen.. 1 Once Daily 8)  Vitamin D 1000 Unit Tabs (Cholecalciferol) .... Take 1 Tablet By Mouth Once A Day 9)  Glucosamine 1500 Complex  Caps (Glucosamine-Chondroit-Vit C-Mn) .... Take 1 Capsule By Mouth Once A Day  Allergies (verified): 1)  ! Levaquin 2)  ! * Toprol Xl 3)  ! Sulfa 4)  ! Pcn  Past History:  Past Medical History: Reviewed history from 08/02/2009 and no changes required. Diverticulosis, colon GERD Hyperlipidemia(LDL 126, HDL 59) Hypertension Osteopenia  Past Surgical History:  Reviewed history from 08/02/2009 and no changes required. Oophorectomy for localized malignant tumor, 1997 Tonsillectomy  Family History: Father: PVD, HTN Mother: HTN, NHL Siblings: sister chronic bronchitis, Raynaud's No FH of Colon Cancer:  Social History: Occupation: Agricultural consultant    Married, 2 children Never Smoked Alcohol use-yes Regular exercise-no Daily Caffeine Use coffee in AM Illicit Drug Use - no Drug Use:  no  Review of Systems  The patient denies allergy/sinus, anemia, anxiety-new, arthritis/joint pain, back pain, blood in urine, breast changes/lumps, confusion, cough, coughing up blood, depression-new, fainting, fatigue, fever, headaches-new, hearing problems, heart murmur, heart rhythm changes, itching, menstrual pain, muscle pains/cramps, night sweats, nosebleeds, pregnancy symptoms, shortness of breath, skin rash, sleeping problems, sore throat, swelling of feet/legs, swollen lymph glands, thirst - excessive, urination - excessive, urination changes/pain, urine leakage, vision changes, and voice change.    Vital Signs:  Patient profile:   65 year old female Height:      61.75 inches Weight:      118 pounds BMI:     21.84 Pulse rate:   76 / minute Pulse rhythm:   regular BP sitting:   104 / 60  (left arm) Cuff size:   regular  Vitals Entered By: Francee Piccolo CMA Duncan Dull) (June 29, 2010 4:05 PM)  Physical Exam  General:  Well developed, well nourished, no acute distress. Head:  Normocephalic and atraumatic. Eyes:  PERRLA, no icterus. Mouth:  No deformity or lesions, Neck:  Supple; no masses or thyromegaly. Lungs:  Clear throughout to auscultation. Heart:  Regular rate and rhythm; no murmurs, rubs,  or bruits. Abdomen:  Soft, nontender and nondistended. No masses, hepatosplenomegaly or hernias noted. Normal bowel sounds. Rectal:  deferred until colonoscopy Msk:  Symmetrical with no gross deformities. Normal posture. Pulses:  Normal pulses noted. Extremities:  No clubbing, cyanosis, edema or deformities noted. Neurologic:  Alert and  oriented x4;  grossly normal neurologically. Skin:  Intact without significant lesions or rashes. Psych:  Alert and cooperative. Normal mood and affect.   Impression & Recommendations:  Problem # 1:   GERD (ICD-530.81) the patient certainly has GERD. She has some typical as well as atypical symptoms. Difficult to know exactly what symptoms are truly related to GERD. I do think that worsening pharyngeal burning and worsening intermittent solid food dysphagia was likely due to GERD when the patient stop PPI therapy. Seems to have improved on Dexilant  Plan: #1. Reflux precautions #2. Continue Dexilant 60 mg daily  Problem # 2:  DYSPHAGIA UNSPECIFIED (ICD-787.20) intermittent solid food dysphagia. History of peptic stricture on prior endoscopy. Somewhat better on PPI therapy.  Plan: #1. Upper endoscopy with esophageal dilation. The nature of the procedure as well as the risks, benefits, and alternatives were reviewed. She understood and agreed to proceed  Problem # 3:  EPIGASTRIC PAIN, CHRONIC (ICD-789.06) ongoing. 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 The patient is at baseline risk for colorectal neoplasia. Now due for followup screening.  Plan: #1. Colonoscopy. The nature of the procedure as well as the risks, benefits, and alternatives were reviewed. She understood and agreed to proceed #2. Movi prep prescribed. The patient instructed on its use  Other Orders: Colon/Endo (Colon/Endo)  Patient Instructions: 1)  Colonoscopy/Endoscopy [facility] 07/19/10 2:30 pm arrive at 1:30 pm 2)  Movi prep instructions given  3)  Movi prep Rx. sent to pharmacy. 4)  Colonoscopy and Flexible Sigmoidoscopy brochure  given.  5)  Copy sent to : [name], MD 6)  The medication list was reviewed and reconciled.  All changed / newly prescribed medications were explained.  A complete medication list was provided to the patient / caregiver. Prescriptions: MOVIPREP 100 GM  SOLR (PEG-KCL-NACL-NASULF-NA ASC-C) As per prep instructions.  #1 x 0   Entered by:   Milford Cage NCMA    Authorized by:   Hilarie Fredrickson MD   Signed by:   Milford Cage NCMA on 06/29/2010   Method used:   Electronically to        [id number] (retail)       [address]       [place], [place]  [address]       Ph: [phone]       Fax: [phone]   RxID:   [id number]

## 2010-09-22 NOTE — Progress Notes (Signed)
Summary: [facility]   Imported By: Sherian Rein 06/30/2010 12:12:06  _____________________________________________________________________  External Attachment:    Type:   Image     Comment:   External Document

## 2010-09-22 NOTE — Assessment & Plan Note (Signed)
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   Vital Signs:  Patient profile:   65 year old female Weight:      119.2 pounds BMI:     22.06 Temp:     98.3 degrees F oral Pulse rate:   76 / minute Resp:     16 per minute BP sitting:   118 / 72  (left arm) Cuff size:   regular  Vitals Entered By: [name] CMA (June 30, 2010 4:08 PM) CC: [place] and ear would never pop when returning home (x 1 month), Ear pain   Primary Care Provider:  [name], MD  CC:  [place] and ear would never pop when returning home (x 1 month) and Ear pain.  History of Present Illness:      This is a 65 year old woman who presents with "cotton in my ears" since mountain trip in mid Oct.   Ruth Jones reports sensation of fullness, hearing loss, and tinnitus as humming, but denies ear discharge, fever, sinus pain, and nasal discharge.  The  symptoms are  located in both ears.   She has had chronic grinding of teeth @ night for which she uses a bite block.  The patient denies headache, toothache, dizziness, and vertigo.  Rx: Neti pot w/o help.  Current Medications (verified): 1)  Solgar Calmag With Vit D .... Take As Directed 2)  One-A-Day Womens Formula  Tabs (Multiple Vitamins-Calcium) .... Take 1 Tablet By Mouth Once A Day 3)  Calcium Citrate 200 Mg Tabs (Calcium Citrate) .... Take 7 Tabs Daily (In Divided Doses) 4)  Vivelle-Dot 0.0375 Mg/24hr Pttw (Estradiol) .... Remove and Apply New Patch Twice A Week 5)  Align  Caps (Probiotic Product) .... Take 1 Capsule By Mouth Once A Day 6)  Dexilant 60 Mg Cpdr (Dexlansoprazole) .Marland Kitchen.. 1 Each Am 7)  Losartan Potassium 50 Mg Tabs (Losartan Potassium) .Marland Kitchen.. 1 Once Daily 8)  Vitamin D 1000 Unit Tabs (Cholecalciferol) .... Take 1 Tablet By Mouth Once A Day 9)  Glucosamine 1500 Complex  Caps (Glucosamine-Chondroit-Vit C-Mn) .... Take 1 Capsule By Mouth Once A Day 10)  Moviprep 100 Gm  Solr  (Peg-Kcl-Nacl-Nasulf-Na Asc-C) .... As Per Prep Instructions.  Allergies: 1)  ! Levaquin 2)  ! * Toprol Xl 3)  ! Sulfa 4)  ! Pcn  Physical Exam  General:  well-nourished,in no acute distress; alert,appropriate and cooperative throughout examination Eyes:  No corneal or conjunctival inflammation noted. EOMI. Perrla. Ears:  External ear exam shows no significant lesions or deformities.  Otoscopic examination reveals clear canals, tympanic membranes are intact bilaterally without bulging, retraction, inflammation or discharge. Hearing is grossly normal bilaterally to whisper @ 6 ft. L TM dull. Tuning fork exam: ? slight lartealization to R ear . Bone conduction > air conduction on L, normal on R. Nose:  External nasal examination shows no deformity or inflammation. Nasal mucosa are pink and moist without lesions or exudates. Mouth:  Oral mucosa and oropharynx without lesions or exudates.  Teeth in good repair. Neurologic:  alert & oriented X3, DTRs symmetrical and normal, finger-to-nose normal, and Romberg negative.   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 Take as directed 2)  One-a-day Womens Formula Tabs (Multiple vitamins-calcium) .... Take 1 tablet by mouth once a day 3)  Calcium Citrate 200 Mg Tabs (Calcium citrate) .... Take 7 tabs daily (in divided doses) 4)  Vivelle-dot 0.0375 Mg/24hr Pttw (Estradiol) .... Remove and apply new patch twice a week 5)  Align Caps (Probiotic product) .... Take 1 capsule by mouth once a day 6)  Dexilant 60 Mg Cpdr (Dexlansoprazole) .Marland Kitchen.. 1 each am 7)  Losartan Potassium 50 Mg Tabs (Losartan potassium) .Marland Kitchen.. 1 once daily 8)  Vitamin D 1000 Unit Tabs (Cholecalciferol) .... Take 1  tablet by mouth once a day 9)  Glucosamine 1500 Complex Caps (Glucosamine-chondroit-vit c-mn) .... Take 1 capsule by mouth once a day 10)  Moviprep 100 Gm Solr (Peg-kcl-nacl-nasulf-na asc-c) .... As per prep instructions. 11)  Prednisone 20 Mg Tabs (Prednisone) .Marland Kitchen.. 1 two times a day with food 12)  Nasonex 50 Mcg/act Susp (Mometasone furoate) .Marland Kitchen.. 1 spray two times a day as needed  Patient Instructions: 1)  Valsalva maneuver 3-4X/ day ; chewsugarless gum.Drink as much fluid as you can tolerate for the next few days.Limit your Sodium (Salt) to less than 4 grams a day (slightly less than 1 teaspoon) to prevent fluid retention, swelling, or worsening or symptoms. Prescriptions: NASONEX 50 MCG/ACT SUSP (MOMETASONE FUROATE) 1 spray two times a day as needed  #1 x 5   Entered and Authorized by:   [name] MD   Signed by:   [name] MD on 06/30/2010   Method used:   Print then Give to Patient   RxID:   [id number] PREDNISONE 20 MG TABS (PREDNISONE) 1 two times a day with food  #10 x 0   Entered and Authorized by:   [name] MD   Signed by:   [name] MD on 06/30/2010   Method used:   Print then Give to Patient   RxID:   [id number]    Orders Added: 1)  Est. Patient Level III [24401] 2)  ENT Referral [ENT]

## 2010-09-22 NOTE — Progress Notes (Signed)
Summary: UPCOMING PROCEDURE  Phone Note From Other Clinic Call back at [phone]   Caller: [facility] Call For: DR. Marina Goodell Details for Reason: Upcoming Colon Summary of Call: Pt. is at [facility] right now. Misty Stanley from that office called stating they need to put her on prednisone for 16 days for hearing loss. Has an upcoming colon/egd scheduled for 11/29. Wants to make sure those meds will not interfere with procedure.  Please call back ASAP. Initial call taken by: Schuyler Amor,  July 06, 2010 4:22 PM  Follow-up for Phone Call        Patient has been on prednisone was started by Dr Alwyn Ren earlier in the week.  Dr Please advise if this will be a problem. Follow-up by: Darcey Nora RN, CGRN,  July 06, 2010 4:36 PM  Additional Follow-up for Phone Call Additional follow up Details #1::        No problem being on prednisone during the procedures Additional Follow-up by: Hilarie Fredrickson MD,  July 06, 2010 4:44 PM    Additional Follow-up for Phone Call Additional follow up Details #2::    Misty Stanley advised of Dr Lamar Sprinkles recommendations. Follow-up by: Darcey Nora RN, CGRN,  July 06, 2010 5:01 PM

## 2010-09-22 NOTE — Progress Notes (Signed)
Summary: PRIOR AUTH-DEXILANT-approved   Phone Note Refill Request   Refills Requested: Medication #1:  DEXILANT 60 MG CPDR 1 each am   Dosage confirmed as above?Dosage Confirmed   Supply Requested: 1 month PRIOR AUTH NEEDED: [phone]  Initial call taken by: [name],  May 09, 2010 9:54 AM  Follow-up for Phone Call        spoke w/ [employer] says that dexliant isn't preferred medication they would approve omeprazole, prilosec, and nexium.  left message on machine need to see if pt has tried these meds also will look in paperchart .Marland KitchenDoristine Devoid CMA  May 10, 2010 1:39 PM    Additional Follow-up for Phone Call Additional follow up Details #1::        pt return call stating that she has tried all the med listed................Marland KitchenFelecia Deloach CMA  May 10, 2010 4:20 PM   called [employer] awaiting form to be faxed .....Marland KitchenMarland KitchenDoristine Devoid CMA  May 11, 2010 10:01 AM     Additional Follow-up for Phone Call Additional follow up Details #2::    form faxed awaiting approval ...Marland KitchenMarland KitchenDoristine Devoid [facility]  May 13, 2010 4:20 PM    Appended Document: PRIOR AUTH-DEXILANT-IN PROCESS-l dexilant approved from 04/21/10 and 05/12/2011

## 2010-09-22 NOTE — Procedures (Signed)
Summary: Upper Endoscopy  Patient: [name] Note: All result statuses are Final unless otherwise noted.  Tests: (1) Upper Endoscopy (EGD)   EGD Upper Endoscopy       DONE     [facility]     [address]     [place], [place]  [address]           ENDOSCOPY PROCEDURE REPORT           PATIENT:  Ruth Jones, Ruth Jones  MR#:  [id number]     BIRTHDATE:  May 10, 1946, 64 yrs. old  GENDER:  female           ENDOSCOPIST:  Wilhemina Bonito. Eda Keys, MD     Referred by:  Office           PROCEDURE DATE:  07/19/2010     PROCEDURE:  EGD, diagnostic 19147     ASA CLASS:  Class II     INDICATIONS:  GERD, dyspepsia, dysphagia (symptoms much better on     Dexilant)           MEDICATIONS:   There was residual sedation effect present from     prior procedure., Versed 1 mg IV     TOPICAL ANESTHETIC:  Exactacain Spray           DESCRIPTION OF PROCEDURE:   After the risks benefits and     alternatives of the procedure were thoroughly explained, informed     consent was obtained.  The LB GIF-H180 [id number] endoscope was     introduced through the mouth and advanced to the second portion of     the duodenum, without limitations.  The instrument was slowly     withdrawn as the mucosa was fully examined.     <<PROCEDUREIMAGES>>           The upper, middle, and distal third of the esophagus were     carefully inspected and no abnormalities were noted. The z-line     was well seen at the GEJ. The endoscope was pushed into the fundus     which was normal including a retroflexed view. The antrum,gastric     body, first and second part of the duodenum were unremarkable.     Retroflexed views revealed no abnormalities.    The scope was then     withdrawn from the patient and the procedure completed. NO     DILATION FELT NECCESSARY.           COMPLICATIONS:  None           ENDOSCOPIC IMPRESSION:     1) Normal EGD     2) GERD           RECOMMENDATIONS:     1) Continue DEXILANT     2) Follow-up as  needed           ____________________________     Wilhemina Bonito. Eda Keys, MD           CC:  Pecola Lawless, MD, The Patient           n.     eSIGNED:   Wilhemina Bonito. Eda Keys at 07/19/2010 03:44 PM           Fredda Hammed, [phone]  Note: An exclamation mark (!) indicates a result that was not dispersed into the flowsheet. Document Creation Date: 07/19/2010 3:45 PM _______________________________________________________________________  (1) Order result status: Final Collection or observation date-time: 07/19/2010 15:39 Requested date-time:  Receipt  date-time:  Reported date-time:  Referring Physician:   Ordering Physician: Fransico Setters [phone]) Specimen Source:  Source: Launa Grill Order Number: [id number] Lab site:

## 2010-09-22 NOTE — Letter (Signed)
Summary: Results Follow up Letter  [facility]  [address] [place], [place] [address]   Phone: [phone]  Fax: [phone]    05/19/2010 MRN: [id number]  Ruth Jones [address] [place], [place]  [address]  Dear [name],  The following are the results of your recent test(s):  Test         Result    Pap Smear:        Normal _____  Not Normal _____ Comments: ______________________________________________________ Cholesterol: LDL(Bad cholesterol):         Your goal is less than:         HDL (Good cholesterol):       Your goal is more than: Comments:  ______________________________________________________ Mammogram:        Normal _____  Not Normal _____ Comments:  ___________________________________________________________________ Hemoccult:        Normal __X___  Not normal _______ Comments:    _____________________________________________________________________ Other Tests:    We routinely do not discuss normal results over the telephone.  If you desire a copy of the results, or you have any questions about this information we can discuss them at your next office visit.   Sincerely,

## 2010-09-22 NOTE — Letter (Signed)
Summary: [facility] Instructions  [facility]  [address] [place], [place] [address]   Phone: [phone]  Fax: [phone]       Ruth Jones    1945-12-27    MRN: [id number]        Procedure Day /Date:TUESDAY 07/19/10     Arrival Time:1:30 PM     Procedure Time:2:30 PM     Location of Procedure:                    X   Endoscopy Center (4th Floor)   PREPARATION FOR COLONOSCOPY WITH MOVIPREP/ENDO   Starting 5 days prior to your procedure 07/14/10 do not eat nuts, seeds, popcorn, corn, beans, peas,  salads, or any raw vegetables.  Do not take any fiber supplements (e.g. Metamucil, Citrucel, and Benefiber).  THE DAY BEFORE YOUR PROCEDURE         DATE: 11/28/11DAY: MONDAY  1.  Drink clear liquids the entire day-NO SOLID FOOD  2.  Do not drink anything colored red or purple.  Avoid juices with pulp.  No orange juice.  3.  Drink at least 64 oz. (8 glasses) of fluid/clear liquids during the day to prevent dehydration and help the prep work efficiently.  CLEAR LIQUIDS INCLUDE: Water Jello Ice Popsicles Tea (sugar ok, no milk/cream) Powdered fruit flavored drinks Coffee (sugar ok, no milk/cream) Gatorade Juice: apple, white grape, white cranberry  Lemonade Clear bullion, consomm, broth Carbonated beverages (any kind) Strained chicken noodle soup Hard Candy                             4.  In the morning, mix first dose of MoviPrep solution:    Empty 1 Pouch A and 1 Pouch B into the disposable container    Add lukewarm drinking water to the top line of the container. Mix to dissolve    Refrigerate (mixed solution should be used within 24 hrs)  5.  Begin drinking the prep at 5:00 p.m. The MoviPrep container is divided by 4 marks.   Every 15 minutes drink the solution down to the next mark (approximately 8 oz) until the full liter is complete.   6.  Follow completed prep with 16 oz of clear liquid of your choice (Nothing red or purple).   Continue to drink clear liquids until bedtime.  7.  Before going to bed, mix second dose of MoviPrep solution:    Empty 1 Pouch A and 1 Pouch B into the disposable container    Add lukewarm drinking water to the top line of the container. Mix to dissolve    Refrigerate  THE DAY OF YOUR PROCEDURE      DATE: 07/19/10 LKG:MWNUUVO  Beginning at 9:30 a.m. (5 hours before procedure):         1. Every 15 minutes, drink the solution down to the next mark (approx 8 oz) until the full liter is complete.  2. Follow completed prep with 16 oz. of clear liquid of your choice.    3. You may drink clear liquids until 12:30 PM(2 HOURS BEFORE PROCEDURE).   MEDICATION INSTRUCTIONS  Unless otherwise instructed, you should take regular prescription medications with a small sip of water   as early as possible the morning of your procedure.         OTHER INSTRUCTIONS  You will need a responsible adult at least 67 years of age to accompany you and  drive you home.   This person must remain in the waiting room during your procedure.  Wear loose fitting clothing that is easily removed.  Leave jewelry and other valuables at home.  However, you may wish to bring a book to read or  an iPod/MP3 player to listen to music as you wait for your procedure to start.  Remove all body piercing jewelry and leave at home.  Total time from sign-in until discharge is approximately 2-3 hours.  You should go home directly after your procedure and rest.  You can resume normal activities the  day after your procedure.  The day of your procedure you should not:   Drive   Make legal decisions   Operate machinery   Drink alcohol   Return to work  You will receive specific instructions about eating, activities and medications before you leave.    The above instructions have been reviewed and explained to me by   _______________________    I fully understand and can verbalize these instructions  _____________________________ Date _________

## 2010-09-22 NOTE — Procedures (Signed)
Summary: Manometry/Todd Abbey Chatters MD  Manometry/Todd Abbey Chatters MD   Imported By: Lester Manning 09/20/2009 09:05:23  _____________________________________________________________________  External Attachment:    Type:   Image     Comment:   External Document

## 2010-09-23 ENCOUNTER — Other Ambulatory Visit: Payer: Self-pay | Admitting: Internal Medicine

## 2010-09-23 DIAGNOSIS — R0981 Nasal congestion: Secondary | ICD-10-CM

## 2010-09-23 NOTE — Procedures (Signed)
Summary: [facility]   Imported By: Sherian Rein 06/30/2010 11:51:50  _____________________________________________________________________  External Attachment:    Type:   Image     Comment:   External Document

## 2010-09-23 NOTE — Letter (Signed)
Summary: Flexible Sigmoidoscopy/[facility]  Flexible Sigmoidoscopy/[facility]   Imported By: Sherian Rein 06/30/2010 11:49:03  _____________________________________________________________________  External Attachment:    Type:   Image     Comment:   External Document

## 2010-09-27 ENCOUNTER — Ambulatory Visit (INDEPENDENT_AMBULATORY_CARE_PROVIDER_SITE_OTHER)
Admission: RE | Admit: 2010-09-27 | Discharge: 2010-09-27 | Disposition: A | Discharge status: Home / Self Care | Payer: BC Managed Care – PPO | Source: Ambulatory Visit | Attending: Internal Medicine | Admitting: Internal Medicine

## 2010-09-27 DIAGNOSIS — R0981 Nasal congestion: Secondary | ICD-10-CM

## 2010-09-27 DIAGNOSIS — J3489 Other specified disorders of nose and nasal sinuses: Secondary | ICD-10-CM

## 2010-10-04 ENCOUNTER — Telehealth: Payer: Self-pay | Admitting: Internal Medicine

## 2010-10-06 NOTE — Progress Notes (Signed)
Summary: still no better--Unable to reach patient  Phone Note Call from Patient Call back at Home Phone [phone] Call back at [phone]   Caller: Patient Summary of Call: Pt still c/o ongoing sinus issues. Pt states that despite continuing Neti pot two times a day as needed  and the new med METRONIDAZOLE 500 MG symptoms continue to increase. Pt notes that symptoms are worse now then before and does not feel med is working. Pls advise..........Marland KitchenFelecia Deloach CMA  September 20, 2010 4:05 PM   Follow-up for Phone Call        OOW note if needed ; CT scan of sinuses(ordered) Follow-up by: [name] MD,  September 21, 2010 8:28 AM  Additional Follow-up for Phone Call Additional follow up Details #1::        left message on both numbers to call back to office. Lucious Groves CMA  September 21, 2010 8:55 AM   Returned call to patient, left message on voicemail and at home to call back to office. Lucious Groves CMA  September 21, 2010 4:16 PM   Left messge for patient to call back.Marland KitchenMarland KitchenHarold Barban  September 22, 2010 10:50 AM    Additional Follow-up for Phone Call Additional follow up Details #2::    No return call. Left message on voicemail and at home to call back to office. Lucious Groves CMA  September 23, 2010 4:19 PM   No return call, please advise. Lucious Groves CMA  September 26, 2010 1:58 PM   Additional Follow-up for Phone Call Additional follow up Details #3:: Details for Additional Follow-up Action Taken: the sinus CT needs to done  or she needs to be seen by ENT for persistent symptoms. Other alternative is referral to [facility] or other [facility] Additional Follow-up by: [name] MD,  September 26, 2010 2:50 PM     Printed and mailed to patient per MD. Lucious Groves CMA  September 26, 2010 3:17 PM

## 2010-10-12 NOTE — Progress Notes (Signed)
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 Tenny Craw. Patient would like to know if MD will allow this referral. Please advise. Initial call taken by: Lucious Groves CMA,  October 04, 2010 8:41 AM  Follow-up for Phone Call        this OK but she should check with his office; I thought he only does ear surgery Follow-up by: [name] MD,  October 04, 2010 1:07 PM  Additional Follow-up for Phone Call Additional follow up Details #1::        Left message on voicemail to call back to office. Lucious Groves CMA  October 04, 2010 3:37 PM   Patient notified of the above, and when I spoke with patient at work number she made me aware that she would like to see him anyway. She notes that she has an upcoming appt with Dr. Laveda Abbe. She notes that we do not need to call their office. .  Additional Follow-up by: Lucious Groves CMA,  October 04, 2010 3:41 PM

## 2010-10-19 ENCOUNTER — Encounter: Payer: Self-pay | Admitting: Internal Medicine

## 2010-10-31 ENCOUNTER — Other Ambulatory Visit: Payer: Self-pay | Admitting: Otolaryngology

## 2010-11-01 NOTE — Letter (Signed)
Summary: [facility]   Imported By: Maryln Gottron 10/25/2010 15:15:53  _____________________________________________________________________  External Attachment:    Type:   Image     Comment:   External Document

## 2010-11-01 NOTE — Therapy (Signed)
Summary: [facility]  [facility]   Imported By: Maryln Gottron 10/25/2010 15:17:11  _____________________________________________________________________  External Attachment:    Type:   Image     Comment:   External Document

## 2010-11-08 ENCOUNTER — Other Ambulatory Visit: Payer: Self-pay | Admitting: Internal Medicine

## 2010-11-11 ENCOUNTER — Ambulatory Visit
Admission: RE | Admit: 2010-11-11 | Discharge: 2010-11-11 | Disposition: A | Discharge status: Home / Self Care | Payer: BC Managed Care – PPO | Source: Ambulatory Visit | Attending: Otolaryngology | Admitting: Otolaryngology

## 2010-11-11 MED ORDER — GADOBENATE DIMEGLUMINE 529 MG/ML IV SOLN: 10.0000 mL | Freq: Once | INTRAVENOUS | Status: AC | PRN

## 2011-01-03 NOTE — Assessment & Plan Note (Signed)
[facility]                         GASTROENTEROLOGY OFFICE NOTE   CALANDRA, MADURA                    MRN:          [id number]  DATE:02/04/2007                            DOB:          01-Apr-1946    REASON FOR CONSULTATION:  1. Refractory reflux disease.  2. Diarrhea.   HISTORY:  This is a 66 year old, white female with a history of  hypertension and gastroesophageal reflux disease. She was last evaluated  in the office February 07, 2006 for persistent problems with nocturnal  epigastric burning discomfort and substernal/pharyngeal burning due to  reflux disease. She does have endoscopic evidence of esophagitis despite  high-does proton pump inhibitor therapy. At the time of her last visit,  she was strictly advised with regards to reflux precautions and placed  on t.i.d. Nexium. We also discussed the possible role of antireflux  surgery. She contacted the office about a month later stating higher  dose Nexium definitely helped. She has continued on this and has not  been seen since. However, recently she called stating that medication  was not working anymore. I recommended surgical referral, she has been  apprehensive because a friend who had antireflux surgery developed some  postop dysphagia which has persisted. In addition to that issue, she  presents today to discuss problems with diarrhea. She has had problems  intermittently for 4-5 years, usually occurs once at night. This has  been infrequent, several times per year. However over the past 3 weeks,  she has had symptoms on a daily basis. Also some nausea, no vomiting, no  fever, no weight loss, no bleeding, some growling and cramping. She did  take Imodium last week which arrested diarrhea for 36 hours. She only  used Imodium on 3 occasions. Colonoscopy in 2001 revealed  diverticulosis.   CURRENT MEDICATIONS:  Nexium, Altace and multivitamin.   PHYSICAL EXAMINATION:  GENERAL:   Well-appearing female in no acute  distress.  VITAL SIGNS:  Blood pressure is 110/76, heart rate is 84, weight is 131  pounds.  HEENT:  Sclera anicteric. Conjunctiva pink, oral mucosa intact. No  adenopathy.  LUNGS:  Clear.  HEART:  Regular.  ABDOMEN:  Soft without tenderness, mass or hernia.  EXTREMITIES:  Without edema.   IMPRESSION:  1. Chronic gastroesophageal reflux disease refractory to medical      therapy.  2. Recent problems with diarrhea. Most likely infectious      gastroenteritis. No worrisome features to suggest inflammatory      diarrhea.   RECOMMENDATIONS:  1. Stool cultures.  2. Trial of Align probiotic 1 p.o. daily for 2 weeks.  3. Imodium p.r.n.  4. Continue Nexium.  5. Again discussed role of antireflux surgery for persistent symptoms.      I even offered her the opportunity to be seen at the tertiary care      center. She will contemplate this.  6. Continue Nexium and reflux precautions for now.  7. Office followup in about 4 weeks.     Wilhemina Bonito. Marina Goodell, MD  Electronically Signed    JNP/MedQ  DD: 02/04/2007  DT: 02/05/2007  Job #:  [id number]   cc:   Titus Dubin. Alwyn Ren, MD,FACP,FCCP

## 2011-01-03 NOTE — Assessment & Plan Note (Signed)
[facility]                         GASTROENTEROLOGY OFFICE NOTE   SHELAGH, RAYMAN                    MRN:          [id number]  DATE:03/13/2007                            DOB:          1945/12/11    HISTORY:  Jaquaya presents today for followup.  She was evaluated February 04, 2007 for reflux disease and diarrhea.  See that dictation for details.  She underwent multiple stool studies which were negative.  Qualitative  fat minimally elevated.  She was treated with the probiotic Align,  within several days her diarrhea resolved.  She describes her bowel  habits currently as normal.  She continues with complaints of nocturnal  regurgitation and pharyngeal burning despite twice daily Nexium.  She  has recently decreased her Nexium to once daily and has not noticed any  worsening of her symptoms.  She asks about switching to an alternative  proton pump inhibitor.  She asks about Zegerid.  We did notice that she  had been on this in the past without benefit.  She had also been on  metoclopramide without benefit.  She also complains of epigastric pain.  She has had this dating back at least 10 years.  Her weight remains  stable.   CURRENT MEDICATIONS:  Nexium, Altace, and multivitamin.   PHYSICAL EXAMINATION:  Finds a well-appearing female in no acute  distress.  Blood pressure 110/66, heart rate 76, weight is 133 pounds.  ABDOMEN:  Soft with mild tenderness in the epigastric region to  palpation, good bowel sounds heard.   IMPRESSION:  1. Gastroesophageal reflux disease.  Patient appears to have      breakthrough symptoms in the form of regurgitation and pharyngeal      burning despite proton pump inhibitor therapy.  We have discussed      anti-reflux surgery as an option.  She remains apprehensive.  She      would like to switch to an alternative proton pump inhibitor.  2. Chronic epigastric pain.  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  3. Lower abdominal complaints with diarrhea felt to represent      infectious gastroenteritis, resolved.   RECOMMENDATIONS:  1. Trial of Prevacid 30 mg daily.  Multiple samples as well as      prescription with multiple refills has been provided.  2. Resume general medical care with Dr. Alwyn Ren.  GI followup as      needed.     Wilhemina Bonito. Marina Goodell, MD  Electronically Signed    JNP/MedQ  DD: 03/13/2007  DT: 03/13/2007  Job #: [id number]   cc:   Titus Dubin. Alwyn Ren, MD,FACP,FCCP

## 2011-04-04 ENCOUNTER — Ambulatory Visit (HOSPITAL_BASED_OUTPATIENT_CLINIC_OR_DEPARTMENT_OTHER)
Admission: RE | Admit: 2011-04-04 | Discharge: 2011-04-04 | Disposition: A | Discharge status: Home / Self Care | Payer: BC Managed Care – PPO | Source: Ambulatory Visit | Attending: Orthopedic Surgery | Admitting: Orthopedic Surgery

## 2011-04-04 DIAGNOSIS — M6740 Ganglion, unspecified site: Secondary | ICD-10-CM | POA: Insufficient documentation

## 2011-04-04 DIAGNOSIS — M19049 Primary osteoarthritis, unspecified hand: Secondary | ICD-10-CM | POA: Insufficient documentation

## 2011-04-06 NOTE — Op Note (Signed)
NAMEALAZE, GARVERICK              ACCOUNT NO.:  [id number]  MEDICAL RECORD NO.:  [id number]  LOCATION:                                 FACILITY:  PHYSICIAN:  Katy Fitch. , M.D.      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  OPERATION: 1. Resection of recurrent mucoid cyst. 2. Distal interphalangeal joint arthrotomy, debridement of marginal     osteophytes, synovectomy, and pressure irrigation with sterile     saline.  OPERATING SURGEON:  Katy Fitch. , MD  ASSISTANT:  Annye Rusk, PA-C  ANESTHESIA:  2% lidocaine metacarpal head level block of left long finger.  This was performed as a minor operating room procedure.  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  She was referred through the courtesy of Dr. Dorinda Hill for evaluation and management of a large mucoid cyst on the dorsal ulnar aspect of her left long finger DIP joint.  This had been previously debrided at the dermatology office with recurrence.  X- rays at our office demonstrated osteoarthrosis of the distal interphalangeal joint with a significant marginal osteophyte at the dorsal ulnar base of the distal phalanx.  We advised Ruth Jones that we could not alter the natural history of her arthritis, however, we could decompress the cyst and by joint debridement typically prevent its recurrence.  We advised her to undergo a cyst excision, DIP joint arthrotomy, irrigation, synovectomy, loose body removal, and marginal osteophyte removal.  The procedure was described in detail.  Questions were invited  and answered.  PROCEDURE:  Ruth Jones was brought to room 1 of the [facility] and placed in supine position upon the operating table.  Following informed consent and Betadine prep, 2% lidocaine was infiltrated at the metacarpal head level to obtain a digital block.  After 5 minutes, excellent anesthesia was achieved.  The left hand was then prepped with Betadine soap and solution and sterilely draped. Following exsanguination of the left long finger with a gauze wrap, a 1/2-inch Penrose drain was placed as a digital tourniquet at the base of the proximal phalanx.  Following a routine surgical time-out, the procedure commenced with a curvilinear incision incorporating the cyst. This was circumferentially dissected and removed with rongeur dissection.  An arthrotomy was created on the dorsal ulnar aspect of the DIP joint, resecting the capsule between the terminal extensor tendon slip and the ulnar collateral ligament.  A microcurette was used to remove the large marginal osteophyte at the base of the distal phalanx. A rongeur was used to perform a synovectomy and curette was used to perform a synovectomy at the base of the distal phalanx.  The wound was then pressure irrigated with a 19-gauge blunt dental needle and a syringe.  No other pathology was identified.  The wound was then repaired with vertical mattress suture of 5-0 nylon.  A compressive  dressing was applied with Xeroflo sterile gauze and Coban.  Ruth Jones was advised to elevate her hand for the next 2-3 days.  She will use Aleve or Advil as her primary analgesic but has a prescription for Vicodin 5 mg 1 p.o. q.4-6 h. p.r.n. pain, 20 tablets without refill. Also, she is requested to use Keflex 500 mg 1 p.o. q.8 h. x4 days as prophylactic antibiotic.     Katy Fitch , M.D.     RVS/MEDQ  D:  04/04/2011  T:  04/04/2011  Job:  [id number]  Electronically Signed by Josephine Igo M.D. on 04/06/2011  12:46:08 PM

## 2011-05-09 ENCOUNTER — Other Ambulatory Visit: Payer: Self-pay | Admitting: Internal Medicine

## 2011-06-12 ENCOUNTER — Telehealth: Payer: Self-pay | Admitting: *Deleted

## 2011-06-12 NOTE — Telephone Encounter (Signed)
Pt called says that she was told by pharmacy that Dexilant needs prior auth. Informed pt that we haven't received information yet but can give samples if she is out of medication until prior auth is complete.

## 2011-06-14 ENCOUNTER — Telehealth: Payer: Self-pay | Admitting: *Deleted

## 2011-06-14 NOTE — Telephone Encounter (Signed)
Pt given samples of med, called pharmacy and requested PA info be faxed. Awaiting fax.

## 2011-06-14 NOTE — Telephone Encounter (Signed)
Did a prior authorization for Dexilant.  Dr Marina Goodell authorized the pt to have more refills 04/2011.  Spoke to [employer] and they did agree to approve the Dexilant 60 mg once daily.  This is approved from 05-24-2011 thru 06-13-2012.  The pt will get an outbound phone call from [facility].  They will also fax [place] an approval letter.  I called [facility] Jani Files, [place] to advise of the approval for the patient from [facility].

## 2011-06-19 NOTE — Telephone Encounter (Signed)
Spoke to pharmacy who advise that Rx was approved by insurance for Pt regular Co-pay

## 2011-08-09 ENCOUNTER — Other Ambulatory Visit: Payer: Self-pay | Admitting: Internal Medicine

## 2011-08-10 ENCOUNTER — Other Ambulatory Visit: Payer: Self-pay

## 2011-08-10 ENCOUNTER — Telehealth: Payer: Self-pay

## 2011-08-10 MED ORDER — DEXLANSOPRAZOLE 60 MG PO CPDR: 1.0000 | DELAYED_RELEASE_CAPSULE | Freq: Every day | ORAL | Status: DC

## 2011-08-10 NOTE — Telephone Encounter (Signed)
request  for refill of Dexilant;  refilled rx electronically

## 2011-09-05 ENCOUNTER — Other Ambulatory Visit: Payer: Self-pay | Admitting: Internal Medicine

## 2011-09-14 ENCOUNTER — Encounter: Payer: Self-pay | Admitting: Internal Medicine

## 2011-09-14 ENCOUNTER — Ambulatory Visit (INDEPENDENT_AMBULATORY_CARE_PROVIDER_SITE_OTHER): Payer: BC Managed Care – PPO | Admitting: Internal Medicine

## 2011-09-14 VITALS — BP 114/68 | HR 76 | Temp 98.6°F | Wt 123.4 lb

## 2011-09-14 DIAGNOSIS — M899 Disorder of bone, unspecified: Secondary | ICD-10-CM

## 2011-09-14 DIAGNOSIS — K219 Gastro-esophageal reflux disease without esophagitis: Secondary | ICD-10-CM

## 2011-09-14 DIAGNOSIS — I10 Essential (primary) hypertension: Secondary | ICD-10-CM

## 2011-09-14 DIAGNOSIS — J019 Acute sinusitis, unspecified: Secondary | ICD-10-CM

## 2011-09-14 DIAGNOSIS — E785 Hyperlipidemia, unspecified: Secondary | ICD-10-CM

## 2011-09-14 DIAGNOSIS — M8580 Other specified disorders of bone density and structure, unspecified site: Secondary | ICD-10-CM

## 2011-09-14 DIAGNOSIS — T887XXA Unspecified adverse effect of drug or medicament, initial encounter: Secondary | ICD-10-CM

## 2011-09-14 MED ORDER — CEFUROXIME AXETIL 250 MG PO TABS: 250.0000 mg | ORAL_TABLET | Freq: Two times a day (BID) | ORAL | Status: AC

## 2011-09-14 NOTE — Patient Instructions (Addendum)
Plain Mucinex for thick secretions ;force NON dairy fluids. Use a Neti pot daily as needed for sinus congestion . Please  schedule fasting Labs : BMET,Lipids, hepatic panel, CBC & dif, TSH, vitamin D level. PLEASE BRING THESE INSTRUCTIONS TO FOLLOW UP  LAB APPOINTMENT.This will guarantee correct labs are drawn, eliminating need for repeat blood sampling ( needle sticks ! ). Diagnoses /Codes: 272.4,401.9,995.20,733.90, 530.81

## 2011-09-14 NOTE — Progress Notes (Signed)
Addended by: Maurice Small on: 09/14/2011 04:58 PM   Modules accepted: Orders

## 2011-09-14 NOTE — Progress Notes (Signed)
  Subjective:    Patient ID: Ruth Jones. Ruth Jones, female    DOB: 1946-06-26, 66 y.o.   MRN: [id number]  HPI Respiratory tract infection Onset/symptoms:"cold" 08/22/2011 Exposures (illness/environmental/extrinsic):students Progression of symptoms:to head congestion Treatments/response:Neti pot up to twice a day, Fluticasone with improvement Present symptoms: Fever/chills/sweats:no Frontal headache:in am especially Facial pain:no Nasal purulence:persistent but improved Sore throat:no Dental pain:no Lymphadenopathy:no Wheezing/shortness of breath:no Cough/sputum/hemoptysis:minor dry cough Associated extrinsic/allergic symptoms:itchy eyes/ sneezing:no Past medical history: Seasonal allergies : no/asthma:no Smoking history:never           Review of Systems   She believes she is due for a bone density study. She believes that she did have osteopenia. She has not had a vitamin D level checked recently.  She has not had lipids checked in over 2 years.  She does have some leg cramps. She is on a diuretic from Dr. Dorma Russell.       Objective:   Physical Exam General appearance:thin but in good health ;well nourished; no acute distress or increased work of breathing is present.  No  lymphadenopathy about the head, neck, or axilla noted.   Eyes: No conjunctival inflammation or lid edema is present.   Ears:  External ear exam shows no significant lesions or deformities.  Otoscopic examination reveals clear canals, tympanic membranes are intact bilaterally without bulging, retraction, inflammation or discharge.  Nose:  External nasal examination shows no deformity or inflammation. Nasal mucosa are pink and moist without lesions or exudates. No septal dislocation or deviation.No obstruction to airflow.   Oral exam: Dental hygiene is good; lips and gums are healthy appearing.There is no oropharyngeal erythema or exudate noted.     Heart:  Normal rate and regular rhythm. S1 and S2 normal  without gallop, murmur, click, rub or other extra sounds.   Lungs:Chest clear to auscultation; no wheezes, rhonchi,rales ,or rubs present.No increased work of breathing.    Extremities:  No cyanosis, edema, or clubbing  noted    Skin: Warm & dry          Assessment & Plan:   #1 low-grade sinusitis with purulent secretions. She is controlled majority of symptoms with nasal and topical anti-inflammatory medication.  Plan: Low-dose broad-spectrum antibiotic should eliminate any residual infection.

## 2011-09-29 ENCOUNTER — Ambulatory Visit (INDEPENDENT_AMBULATORY_CARE_PROVIDER_SITE_OTHER)
Admission: RE | Admit: 2011-09-29 | Discharge: 2011-09-29 | Disposition: A | Discharge status: Home / Self Care | Payer: BC Managed Care – PPO | Source: Ambulatory Visit | Attending: Internal Medicine | Admitting: Internal Medicine

## 2011-09-29 DIAGNOSIS — M8580 Other specified disorders of bone density and structure, unspecified site: Secondary | ICD-10-CM

## 2011-09-29 DIAGNOSIS — M899 Disorder of bone, unspecified: Secondary | ICD-10-CM

## 2011-10-05 ENCOUNTER — Other Ambulatory Visit: Payer: Self-pay | Admitting: Internal Medicine

## 2011-10-09 ENCOUNTER — Other Ambulatory Visit (INDEPENDENT_AMBULATORY_CARE_PROVIDER_SITE_OTHER): Payer: BC Managed Care – PPO

## 2011-10-09 DIAGNOSIS — T887XXA Unspecified adverse effect of drug or medicament, initial encounter: Secondary | ICD-10-CM

## 2011-10-09 DIAGNOSIS — K219 Gastro-esophageal reflux disease without esophagitis: Secondary | ICD-10-CM

## 2011-10-09 DIAGNOSIS — E785 Hyperlipidemia, unspecified: Secondary | ICD-10-CM

## 2011-10-09 DIAGNOSIS — I10 Essential (primary) hypertension: Secondary | ICD-10-CM

## 2011-10-09 DIAGNOSIS — M8580 Other specified disorders of bone density and structure, unspecified site: Secondary | ICD-10-CM

## 2011-10-09 LAB — BASIC METABOLIC PANEL
BUN: 14 mg/dL (ref 6–23)
Chloride: 99 mEq/L (ref 96–112)
GFR: 80.56 mL/min (ref >60.00)
Glucose, Bld: 91 mg/dL (ref 70–99)
Potassium: 4 mEq/L (ref 3.5–5.1)
Sodium: 136 mEq/L (ref 135–145)

## 2011-10-09 LAB — CBC WITH DIFFERENTIAL/PLATELET
Eosinophils Relative: 4.2 % (ref 0.0–5.0)
HCT: 42.4 % (ref 36.0–46.0)
Hemoglobin: 14.2 g/dL (ref 12.0–15.0)
Lymphs Abs: 1.5 10*3/uL (ref 0.7–4.0)
MCV: 95.4 fl (ref 78.0–100.0)
Monocytes Absolute: 0.6 10*3/uL (ref 0.1–1.0)
Monocytes Relative: 10.9 % (ref 3.0–12.0)
Neutro Abs: 2.8 10*3/uL (ref 1.4–7.7)
RDW: 13.7 % (ref 11.5–14.6)

## 2011-10-09 LAB — LIPID PANEL
HDL: 76.9 mg/dL (ref >39.00)
Total CHOL/HDL Ratio: 3
Triglycerides: 71 mg/dL (ref 0.0–149.0)
VLDL: 14.2 mg/dL (ref 0.0–40.0)

## 2011-10-09 LAB — HEPATIC FUNCTION PANEL
ALT: 23 U/L (ref 0–35)
Bilirubin, Direct: 0.1 mg/dL (ref 0.0–0.3)
Total Bilirubin: 1.2 mg/dL (ref 0.3–1.2)

## 2011-10-17 ENCOUNTER — Encounter: Payer: Self-pay | Admitting: Internal Medicine

## 2011-12-05 ENCOUNTER — Ambulatory Visit (INDEPENDENT_AMBULATORY_CARE_PROVIDER_SITE_OTHER): Payer: BC Managed Care – PPO | Admitting: Internal Medicine

## 2011-12-05 ENCOUNTER — Encounter: Payer: Self-pay | Admitting: Internal Medicine

## 2011-12-05 VITALS — BP 116/78 | HR 64 | Temp 98.4°F | Wt 125.0 lb

## 2011-12-05 DIAGNOSIS — E041 Nontoxic single thyroid nodule: Secondary | ICD-10-CM

## 2011-12-05 NOTE — Progress Notes (Signed)
  Subjective:    Patient ID: Ruth Jones. Ruth Jones, female    DOB: September 03, 1945, 66 y.o.   MRN: [id number]  HPI Several weeks ago her Dentist noted possible enlargement of thyroid and recommended followup. She was unaware of these changes.  She does have some fatigue but denies blurred vision, double vision, or loss of vision. She's had no hoarseness or trouble swallowing. She has had stable weight. She denies any bowel changes or change in her hair, skin, or nails.  She denies any temperature intolerance to heat or cold  There is no personal or family history of thyroid disorder.  Her TSH was therapeutic in February this year    Review of Systems her husband describes snoring and possible apnea. The fatigue has been present for a few months.  She denies significant nocturia  She states that over spring break she slept as late as 10:00 each day because of fatigue     Objective:   Physical Exam  Gen.: Thin but well-nourished; in no acute distress Eyes: Extraocular motion intact; no lid lag or proptosis  Neck: I cannot appreciate thyromegaly. There is a question of hyoid prominence versus nodule on the right Heart: Normal rhythm and rate without significant murmur, gallop, or extra heart sounds Lungs: Chest clear to auscultation without rales,rales, wheezes Neuro:Deep tendon reflexes are equal and within normal limits; no tremor  Skin: Warm and dry without significant lesions or rashes; no onycholysis Psych: Normally communicative and interactive; no abnormal mood or affect clinically.   Lymph: No neck or axillary lymphadenopathy          Assessment & Plan:  #1 possible thyroid nodule  #2 snoring and possible apnea  Plan: Thyroid ultrasound.  I asked her to discuss the significance of snoring and apnea with her husband. If these truly are  Present to any degree; sleep apnea study should be pursued

## 2011-12-05 NOTE — Patient Instructions (Signed)
The thyroid US  referral will be scheduled and you'll be notified of the time.

## 2011-12-12 ENCOUNTER — Other Ambulatory Visit: Payer: BC Managed Care – PPO

## 2011-12-12 ENCOUNTER — Ambulatory Visit
Admission: RE | Admit: 2011-12-12 | Discharge: 2011-12-12 | Disposition: A | Discharge status: Home / Self Care | Payer: BC Managed Care – PPO | Source: Ambulatory Visit | Attending: Internal Medicine | Admitting: Internal Medicine

## 2011-12-12 DIAGNOSIS — E041 Nontoxic single thyroid nodule: Secondary | ICD-10-CM

## 2011-12-20 ENCOUNTER — Telehealth: Payer: Self-pay | Admitting: *Deleted

## 2011-12-20 NOTE — Telephone Encounter (Signed)
Per OV Pt was asked to discuss the significance of snoring and apnea with her husband. If these truly are Present to any degree; sleep apnea study should be pursued. . Pt called back to report that after speaking with her husband he advise that this happen about 2-3 times a weeks. Pt notes that it depends on who awake first if symptoms are notices.

## 2011-12-24 ENCOUNTER — Other Ambulatory Visit: Payer: Self-pay | Admitting: Internal Medicine

## 2011-12-24 DIAGNOSIS — R0681 Apnea, not elsewhere classified: Secondary | ICD-10-CM

## 2012-01-07 ENCOUNTER — Other Ambulatory Visit: Payer: Self-pay | Admitting: Internal Medicine

## 2012-01-17 ENCOUNTER — Encounter: Payer: Self-pay | Admitting: Pulmonary Disease

## 2012-01-17 ENCOUNTER — Ambulatory Visit (INDEPENDENT_AMBULATORY_CARE_PROVIDER_SITE_OTHER): Payer: BC Managed Care – PPO | Admitting: Pulmonary Disease

## 2012-01-17 VITALS — BP 112/68 | HR 67 | Temp 98.7°F | Ht 62.0 in | Wt 126.0 lb

## 2012-01-17 DIAGNOSIS — R0683 Snoring: Secondary | ICD-10-CM | POA: Insufficient documentation

## 2012-01-17 DIAGNOSIS — R0989 Other specified symptoms and signs involving the circulatory and respiratory systems: Secondary | ICD-10-CM

## 2012-01-17 DIAGNOSIS — R0609 Other forms of dyspnea: Secondary | ICD-10-CM

## 2012-01-17 HISTORY — DX: Snoring: R06.83

## 2012-01-17 NOTE — Patient Instructions (Signed)
Discuss with husband what we have discussed, and call me if you wish to pursue home sleep testing.  I think your risks based on your history are low.

## 2012-01-17 NOTE — Progress Notes (Signed)
Subjective:    Patient ID: Ruth Jones. [name], female    DOB: January 31, 1946, 66 y.o.   MRN: [id number]  HPI The patient is a 66 year old female who been asked to see for possible obstructive sleep apnea.  The patient states that she has been noted to have some snoring by her husband, but is told that it is not a significant problem.  He has also noted an occasional abnormal breathing pattern during sleep, but this is not an everyday occurrence.  The patient states that she has 2-3 awakenings a night for unknown reason, and does not feel completely rested in the mornings upon arising.  She has never been told that she kicks during the night, and denies any symptoms of RLS.  She feels that her alertness is totally normal during the day, and has no issues with sleepiness if she sits down to read or watch television in the evenings.  She does not nap on the weekends.  She denies any sleepiness issues with driving.  The patient states that her weight has been fairly stable over the last few years, and her Epworth Sleepiness Scale today is only 6.  Sleep Questionnaire: What time do you typically go to bed?( Between what hours) 10:30-11:30pm How long does it take you to fall asleep? 5-10 minutes How many times during the night do you wake up? 3 What time do you get out of bed to start your day? 7829 Do you drive or operate heavy machinery in your occupation? No How much has your weight changed (up or down) over the past two years? (In pounds) 5 lb (2.268 kg) Have you ever had a sleep study before? No Do you currently use CPAP? No Do you wear oxygen at any time? No    Review of Systems  Constitutional: Negative.  Negative for fever and unexpected weight change.  HENT: Negative.  Negative for ear pain, nosebleeds, congestion, sore throat, rhinorrhea, sneezing, trouble swallowing, dental problem, postnasal drip and sinus pressure.   Eyes: Negative.  Negative for redness and itching.  Respiratory: Negative.   Negative for cough, chest tightness, shortness of breath and wheezing.   Cardiovascular: Negative.  Negative for palpitations and leg swelling.  Gastrointestinal: Negative for nausea and vomiting.       Heartburn Indigestion   Genitourinary: Negative.  Negative for dysuria.  Musculoskeletal: Positive for back pain. Negative for joint swelling.  Skin: Negative.  Negative for rash.  Neurological: Negative.  Negative for headaches.  Hematological: Negative.  Does not bruise/bleed easily.  Psychiatric/Behavioral: Negative.  Negative for dysphoric mood. The patient is not nervous/anxious.        Objective:   Physical Exam Constitutional:  Well developed, no acute distress  HENT:  Nares patent without discharge, but deviated septum to left with narrowing.   Oropharynx without exudate, palate and uvula are normal  Eyes:  Perrla, eomi, no scleral icterus  Neck:  No JVD, no TMG  Cardiovascular:  Normal rate, regular rhythm, no rubs or gallops.  No murmurs        Intact distal pulses  Pulmonary :  Normal breath sounds, no stridor or respiratory distress   No rales, rhonchi, or wheezing  Abdominal:  Soft, nondistended, bowel sounds present.  No tenderness noted.   Musculoskeletal:  No lower extremity edema noted.  Lymph Nodes:  No cervical lymphadenopathy noted  Skin:  No cyanosis noted  Neurologic:  Alert, appropriate, moves all 4 extremities without obvious deficit.  Assessment & Plan:

## 2012-01-17 NOTE — Assessment & Plan Note (Signed)
The patient has been noted to have mild snoring as well as an occasional abnormal breathing pattern during sleep.  She really does not have the body habitus that would put her at high risk for sleep apnea, but certainly there are many thin patients who do have sleep apnea.  The patient feels that her alertness during the day is totally normal, and the only piece of history that is concerning is the fact that she is often not rested in the mornings upon arising.  I feel comfortable saying that she does not have clinically significant sleep apnea, but that does not exclude the upper airway resistant syndrome or mild sleep apnea.  These entities have no significant impact to her cardiovascular health, and because she is not overly symptomatic I would not treat her for these anyway.  I have offered to do home sleep testing as a screen if her or her husband feel strongly about this.  Otherwise, I would not pursue further unless she has increased symptoms.  The patient will discuss with her husband and call if she wishes to have home sleep testing.

## 2012-04-17 ENCOUNTER — Other Ambulatory Visit: Payer: Self-pay

## 2012-04-17 ENCOUNTER — Ambulatory Visit (INDEPENDENT_AMBULATORY_CARE_PROVIDER_SITE_OTHER): Payer: Medicare Other | Admitting: Internal Medicine

## 2012-04-17 ENCOUNTER — Encounter: Payer: Self-pay | Admitting: Internal Medicine

## 2012-04-17 VITALS — BP 112/70 | HR 69 | Temp 98.3°F | Wt 124.8 lb

## 2012-04-17 DIAGNOSIS — R0789 Other chest pain: Secondary | ICD-10-CM

## 2012-04-17 DIAGNOSIS — I499 Cardiac arrhythmia, unspecified: Secondary | ICD-10-CM

## 2012-04-17 DIAGNOSIS — K219 Gastro-esophageal reflux disease without esophagitis: Secondary | ICD-10-CM

## 2012-04-17 DIAGNOSIS — R1013 Epigastric pain: Secondary | ICD-10-CM

## 2012-04-17 LAB — CBC WITH DIFFERENTIAL/PLATELET
Basophils Relative: 0.5 % (ref 0.0–3.0)
Eosinophils Absolute: 0.1 10*3/uL (ref 0.0–0.7)
HCT: 42.8 % (ref 36.0–46.0)
Hemoglobin: 14 g/dL (ref 12.0–15.0)
Lymphs Abs: 1.7 10*3/uL (ref 0.7–4.0)
MCHC: 32.7 g/dL (ref 30.0–36.0)
MCV: 94.8 fl (ref 78.0–100.0)
Monocytes Absolute: 0.6 10*3/uL (ref 0.1–1.0)
Neutro Abs: 4.6 10*3/uL (ref 1.4–7.7)
RBC: 4.51 Mil/uL (ref 3.87–5.11)
RDW: 13.9 % (ref 11.5–14.6)

## 2012-04-17 LAB — HEPATIC FUNCTION PANEL
Albumin: 4.1 g/dL (ref 3.5–5.2)
Alkaline Phosphatase: 55 U/L (ref 39–117)
Total Protein: 7 g/dL (ref 6.0–8.3)

## 2012-04-17 LAB — BASIC METABOLIC PANEL
CO2: 28 mEq/L (ref 19–32)
Chloride: 97 mEq/L (ref 96–112)
Glucose, Bld: 79 mg/dL (ref 70–99)
Sodium: 135 mEq/L (ref 135–145)

## 2012-04-17 LAB — LIPASE: Lipase: 28 U/L (ref 11.0–59.0)

## 2012-04-17 LAB — AMYLASE: Amylase: 61 U/L (ref 27–131)

## 2012-04-17 LAB — CK TOTAL AND CKMB (NOT AT ARMC)
CK, MB: 2.5 ng/mL (ref 0.3–4.0)
Total CK: 80 U/L (ref 7–177)

## 2012-04-17 MED ORDER — DEXLANSOPRAZOLE 60 MG PO CPDR: 1.0000 | DELAYED_RELEASE_CAPSULE | Freq: Every day | ORAL | Status: DC

## 2012-04-17 MED ORDER — CILIDINIUM-CHLORDIAZEPOXIDE 2.5-5 MG PO CAPS: ORAL_CAPSULE | ORAL | Status: DC

## 2012-04-17 NOTE — Patient Instructions (Addendum)
Please complete stool cards. The triggers for reflux  include stress; the "aspirin family" ; alcohol; peppermint; and caffeine (coffee, tea, cola, and chocolate). The aspirin family would include aspirin and the nonsteroidal agents such as ibuprofen &  Naproxen. Tylenol would not cause reflux. If having symptoms ; food & drink should be avoided for @ least 2 hours before going to bed.  If you activate My Chart; the results can be released to you as soon as they populate from the lab. If you choose not to use this program; the labs have to be reviewed, copied & mailed   causing a delay in getting the results to you.  

## 2012-04-17 NOTE — Telephone Encounter (Signed)
Refilled Dexilant 

## 2012-04-17 NOTE — Progress Notes (Signed)
  Subjective:    Patient ID: Ruth Jones. Ruth Jones, female    DOB: [birth year], 66 y.o.   MRN: [id number]  HPI #1  ABDOMINAL PAIN: Location:across epigastrium evening & overnight Onset: flare 1 mo ago w/o trigger; last episode last week   Radiation: no Severity: up to 8 Quality: dull ache  Duration: hours  Better with: Advil  Worse with: no definite factor  #2 Palpitations: Onset:1 mo ago with GI symptoms Character : intermittent "pounding" Duration:minutes-hours Frequency:2-3 X /week Treatment/response:no Trigger/exacerbating factors: No excess intake of stimulants (decongestants, diet pills or  Nicotine. 1 mug of coffee daily   Occasional non exertional chest tightness.It was significant last night with sense of panic.It is minor today.No constellation of headache, flushing,chest pain & diarrhea.       Review of Systems  Nausea/Vomiting: no Dysphagia: no Diarrhea: no Constipation: no Melena/BRBPR: no Hematemesis: no Anorexia:no Fever/Chills/Weight loss: no Dysuria/ hematuria/pyuria: no NSAIDs: averaging 6 -8 Advil / week  No fatigue, sleep issues Cardiovascular:no  syncope, diaphoresis, claudication Derm: no skin, hair, or nail changes Neurologic:no tremor or numbness . Rare  Tingling RUE driving Psych:no significant  Depression. Some anxiety & panic attacks Endocrine:no hoarseness, temperature intolerance        Objective:   Physical Exam General appearance is one of good health and nourishment w/o distress.  Eyes: No conjunctival inflammation or scleral icterus is present. Neck:  No deformities, thyromegaly, masses, or tenderness noted.   Supple with full range of motion    Oral exam: Dental hygiene is good; lips and gums are healthy appearing.There is no oropharyngeal erythema or exudate noted.   Heart:  Normal rate and regular rhythm. S1 and S2 normal without gallop, murmur, click, rub . S 4 w/o extra sounds     Lungs:Chest clear to auscultation; no  wheezes, rhonchi,rales ,or rubs present.No increased work of breathing.   Abdomen: bowel sounds normal, soft and non-tender without masses, organomegaly or hernias noted.  No guarding or rebound   Skin:Warm & dry.  Intact without suspicious lesions or rashes ; no jaundice or tenting  Lymphatic: No lymphadenopathy is noted about the head, neck, axilla, or inguinal areas.  Musculoskeletal/extremities: No deformity or scoliosis noted of  the thoracic or lumbar spine. She is able to lie flat and sit up without help.  No clubbing, cyanosis, edema, or deformity noted. Tone & strength  normal.Joints normal. Nail health  good.  No onycholysis  Neuro:  deep tendon reflexes were normal; no tremor   Psych: Mood and affect are normal. Normally interactive                                                                                         Assessment & Plan:  #1epigastric pain #2 palpitations Plan: See orders and recommendations

## 2012-04-19 ENCOUNTER — Encounter: Payer: Self-pay | Admitting: Internal Medicine

## 2012-04-25 ENCOUNTER — Telehealth: Payer: Self-pay | Admitting: Internal Medicine

## 2012-04-26 NOTE — Telephone Encounter (Signed)
Called [facility] [employer] to get prior authorization on Dexilant.  Obtained authorization for another year beginning 04-05-12, case #[id number].  Called pt and left message that this had been completed.

## 2012-04-29 ENCOUNTER — Other Ambulatory Visit (INDEPENDENT_AMBULATORY_CARE_PROVIDER_SITE_OTHER): Payer: Medicare Other

## 2012-04-29 DIAGNOSIS — Z1289 Encounter for screening for malignant neoplasm of other sites: Secondary | ICD-10-CM

## 2012-04-29 LAB — POC HEMOCCULT BLD/STL (OFFICE/1-CARD/DIAGNOSTIC): Fecal Occult Blood, POC: NEGATIVE

## 2012-05-22 ENCOUNTER — Other Ambulatory Visit: Payer: Self-pay | Admitting: Internal Medicine

## 2012-05-22 MED ORDER — FLUTICASONE PROPIONATE 50 MCG/ACT NA SUSP: 2.0000 | Freq: Every day | NASAL | Status: DC

## 2012-05-22 NOTE — Telephone Encounter (Signed)
refill flonase nasal suspension 50 MCG/ACT #16 use 1-spray into each nostril one to two times a day as needed -- last fill 9.24.12--lst ov 8.28.13

## 2012-05-22 NOTE — Telephone Encounter (Signed)
RX sent

## 2012-07-21 ENCOUNTER — Other Ambulatory Visit: Payer: Self-pay | Admitting: Internal Medicine

## 2012-07-22 NOTE — Telephone Encounter (Signed)
Rx sent.    MW 

## 2012-09-28 ENCOUNTER — Other Ambulatory Visit: Payer: Self-pay | Admitting: Internal Medicine

## 2012-10-14 ENCOUNTER — Other Ambulatory Visit: Payer: Self-pay | Admitting: Internal Medicine

## 2013-01-01 ENCOUNTER — Other Ambulatory Visit: Payer: Self-pay | Admitting: Internal Medicine

## 2013-01-01 NOTE — Telephone Encounter (Signed)
Per Dr.Hopper 60/1

## 2013-01-08 ENCOUNTER — Encounter: Payer: Self-pay | Admitting: Internal Medicine

## 2013-01-08 ENCOUNTER — Ambulatory Visit (INDEPENDENT_AMBULATORY_CARE_PROVIDER_SITE_OTHER): Payer: Medicare Other | Admitting: Internal Medicine

## 2013-01-08 VITALS — BP 118/72 | HR 72 | Temp 98.7°F | Wt 124.0 lb

## 2013-01-08 DIAGNOSIS — M545 Low back pain, unspecified: Secondary | ICD-10-CM

## 2013-01-08 DIAGNOSIS — J209 Acute bronchitis, unspecified: Secondary | ICD-10-CM

## 2013-01-08 LAB — POCT URINALYSIS DIPSTICK
Glucose, UA: NEGATIVE
Nitrite, UA: NEGATIVE
Protein, UA: NEGATIVE
Urobilinogen, UA: 0.2

## 2013-01-08 MED ORDER — FLUTICASONE PROPIONATE 50 MCG/ACT NA SUSP: 2.0000 | Freq: Every day | NASAL | Status: DC

## 2013-01-08 MED ORDER — CEFUROXIME AXETIL 500 MG PO TABS: 500.0000 mg | ORAL_TABLET | Freq: Two times a day (BID) | ORAL | Status: DC

## 2013-01-08 NOTE — Progress Notes (Signed)
  Subjective:    Patient ID: Ruth Jones, female    DOB: Jan 04, 1946, 67 y.o.   MRN: [id number]  HPI   Symptoms began 12/31/12 as postnasal drainage for which she took over-the-counter preparations including Emergency. She also used her nasal steroid as well as the nasal lavage. The nasal lavage did help clear the sinuses.  As of 5/17 she developed a cough which was treated with over-the-counter cough drops. She does produce green brown sputum mainly in AM. She's had slight shortness of breath but no wheezing. She developed laryngitis as of 5/18.  She also has noted some low back discomfort without associated dysuria, pyuria, or hematuria.  Penicillin has caused yeast infections in the past; she does not have allergic reaction to penicillin.      Review of Systems  She denies fever, chills, or sweats. She is not having earache or discharge. She's had some frontal and crown discomfort/headache. She is not had facial pain or significant nasal purulence of the neck with the many pot.       Objective:   Physical Exam General appearance:good health ;well nourished; no acute distress or increased work of breathing is present.  No  lymphadenopathy about the head, neck, or axilla noted.   Eyes: No conjunctival inflammation or lid edema is present.  Ears:  External ear exam shows no significant lesions or deformities.  Otoscopic examination reveals clear canals, tympanic membranes are intact bilaterally without bulging, retraction, inflammation or discharge.  Nose:  External nasal examination shows no deformity or inflammation. Nasal mucosa are pink and moist without lesions or exudates. No septal dislocation or deviation.No obstruction to airflow.   Oral exam: Dental hygiene is good; lips and gums are healthy appearing.There is no oropharyngeal erythema or exudate noted. Laryngitis present  Neck:  No deformities, masses, or tenderness noted.      Heart:  Normal rate and regular rhythm.  S1 and S2 normal without gallop, murmur, click, rub or other extra sounds.   Lungs:Chest clear to auscultation; no wheezes, rhonchi,rales ,or rubs present.No increased work of breathing.    Extremities:  No cyanosis, edema, or clubbing  noted    Skin: Warm & dry  . No rash or lesions over the lumbosacral spine  She is able to lie flat and sit up without help. Strength, tone, deep tendon reflexes are normal. Straight leg raising is negative to 90. No discomfort with percussion over the lumbosacral spine         Assessment & Plan:  #1 bronchitis, acute with purulent sputum  #2 low back syndrome without neurologic deficit  Plan: See orders recommendations

## 2013-01-08 NOTE — Patient Instructions (Addendum)
The best exercises for the low back include freestyle swimming, stretch aerobics, and yoga. Use an anti-inflammatory cream such as Aspercreme or Zostrix cream twice a day to the back as needed. In lieu of this warm moist compresses or  hot water bottle can be used. Do not apply ice. Plain Mucinex (NOT D) for thick secretions ;force NON dairy fluids .    Continue nasal cleansing in the shower as discussed with lather of mild shampoo.After 10 seconds wash off lather while  exhaling through nostrils. Make sure that all residual soap is removed to prevent irritation.  Flonase 1 spray in each nostril twice a day as needed. Use the "crossover" technique into opposite nostril spraying toward opposite ear @ 45 degree angle, not straight up into nostril.  Use a Neti pot daily only  as needed for significant sinus congestion; going from open side to congested side . Plain Allegra (NOT D )  160 daily , Loratidine 10 mg , OR Zyrtec 10 mg @ bedtime  as needed for itchy eyes & sneezing.

## 2013-01-09 ENCOUNTER — Ambulatory Visit: Payer: Medicare Other | Admitting: Internal Medicine

## 2013-01-10 LAB — URINE CULTURE
Colony Count: NO GROWTH
Organism ID, Bacteria: NO GROWTH

## 2013-02-03 ENCOUNTER — Encounter: Payer: Self-pay | Admitting: Internal Medicine

## 2013-04-14 ENCOUNTER — Other Ambulatory Visit: Payer: Self-pay | Admitting: Internal Medicine

## 2013-06-26 ENCOUNTER — Ambulatory Visit (INDEPENDENT_AMBULATORY_CARE_PROVIDER_SITE_OTHER): Payer: Medicare Other | Admitting: Internal Medicine

## 2013-06-26 ENCOUNTER — Other Ambulatory Visit: Payer: Self-pay

## 2013-06-26 ENCOUNTER — Encounter: Payer: Self-pay | Admitting: Internal Medicine

## 2013-06-26 VITALS — BP 114/72 | HR 62 | Temp 97.7°F | Resp 16

## 2013-06-26 DIAGNOSIS — I10 Essential (primary) hypertension: Secondary | ICD-10-CM

## 2013-06-26 DIAGNOSIS — Z23 Encounter for immunization: Secondary | ICD-10-CM

## 2013-06-26 DIAGNOSIS — H8102 Meniere's disease, left ear: Secondary | ICD-10-CM

## 2013-06-26 DIAGNOSIS — I7300 Raynaud's syndrome without gangrene: Secondary | ICD-10-CM

## 2013-06-26 DIAGNOSIS — H8109 Meniere's disease, unspecified ear: Secondary | ICD-10-CM

## 2013-06-26 HISTORY — DX: Meniere's disease, left ear: H81.02

## 2013-06-26 LAB — BASIC METABOLIC PANEL
Calcium: 9.2 mg/dL (ref 8.4–10.5)
Chloride: 97 mEq/L (ref 96–112)
Creatinine, Ser: 0.8 mg/dL (ref 0.4–1.2)

## 2013-06-26 MED ORDER — AMLODIPINE BESYLATE 2.5 MG PO TABS: 2.5000 mg | ORAL_TABLET | Freq: Every day | ORAL | Status: DC

## 2013-06-26 MED ORDER — LOSARTAN POTASSIUM 50 MG PO TABS: ORAL_TABLET | ORAL | Status: DC

## 2013-06-26 NOTE — Progress Notes (Signed)
Subjective:    Patient ID: Ruth Jones, female    DOB: March 17, 1946, 67 y.o.   MRN: [id number]  HPI She describes a "cold nose" for the last 9-12 months. This is associated with aching discomfort with involvement at the base of the nose as well. This discomfort can last one-2 hours or more. Subsequently she will develop a frontal headache.Her nose can turn red, brown or purple.  There are no specific triggers for this such as exposure to air conditioning or fan. It is non-seasonal occurring whether the ambient air is cold or warm. It can occur at night and awaken her.    Review of Systems BP not monitored  She denies any rhinitis symptoms related to food triggers.  She denies rhinosinusitis symptoms of frontal sinus pain, maxillary sinus pain, nasal purulence, dental pain, sore throat, otic pain, or otic discharge. She has no associated fever, chills, or sweats  She will have some coldness of her extremities at times but she denies change in color such as Raynaud's phenomena. She also has no significant signs or symptoms involving the ears.    Objective:   Physical Exam Gen.: Healthy and well-nourished in appearance. Alert, appropriate and cooperative throughout exam.Appears younger than stated age   Eyes: No corneal or conjunctival inflammation noted. Pupils equal round reactive to light and accommodation. Extraocular motion intact.  Ears: External  ear exam reveals no significant lesions or deformities. Canals clear .TMs normal. Hearing is grossly normal bilaterally. Nose: External nasal exam reveals no deformity or inflammation. Nasal mucosa are pink and moist. No lesions or exudates noted. Nose is cool to palpation; no color changes noted. Mouth: Oral mucosa and oropharynx reveal no lesions or exudates. Teeth in good repair. Neck: No deformities, masses, or tenderness noted.  Lungs: Normal respiratory effort; chest expands symmetrically. Lungs are clear to auscultation without rales,  wheezes, or increased work of breathing. Heart: Normal rate and rhythm. Normal S1 and S2. No gallop, click, or rub.No murmur. Abdomen: Bowel sounds normal; abdomen soft and nontender. No masses, organomegaly or hernias noted. Aorta is palpable; no aneurysm present                                   Musculoskeletal/extremities:  No clubbing, cyanosis, edema, or significant extremity  deformity noted. Range of motion normal .Tone & strength normal. Hand joints normal . Fingernail / toenail health good. Able to lie down & sit up w/o help.  Vascular: Carotid, radial artery, dorsalis pedis and  posterior tibial pulses are full and equal. Initially he had a faint bruit was suggested of the aorta but this was not persistent. Neurologic: Alert and oriented x3.       Skin: Intact without suspicious lesions or rashes. There is some rubor of the hands and feet which blanches to pressure. Hands and feet are cool. No ischemic changes present Lymph: No cervical, axillary lymphadenopathy present. Psych: Mood and affect are normal. Normally interactive  Assessment & Plan:  #1 Raynaud's phenomena involving the nose. No evidence of vascular compromise. Vasomotor rhinitis not suggested #2 HTN controlled

## 2013-06-26 NOTE — Patient Instructions (Signed)
Your next office appointment will be determined based upon review of your pending labs . Those instructions will be transmitted to you through My Chart  . Please report any significant change in your symptoms. Protect  hands, feet, and ears from cold exposure including ice chests. Use silk sock & mitten liners; this can be purchased at outdoor supply stores.

## 2013-08-17 ENCOUNTER — Other Ambulatory Visit: Payer: Self-pay | Admitting: Internal Medicine

## 2013-08-18 NOTE — Telephone Encounter (Signed)
Losartan refilled per protocol. JG//CMA 

## 2013-09-18 ENCOUNTER — Other Ambulatory Visit: Payer: Self-pay | Admitting: Internal Medicine

## 2013-09-19 NOTE — Telephone Encounter (Signed)
Librax refilled per protocol. JG//CMA

## 2013-11-26 ENCOUNTER — Other Ambulatory Visit: Payer: Self-pay

## 2013-11-26 DIAGNOSIS — Z1231 Encounter for screening mammogram for malignant neoplasm of breast: Secondary | ICD-10-CM

## 2013-12-23 ENCOUNTER — Other Ambulatory Visit: Payer: Self-pay | Admitting: Internal Medicine

## 2013-12-24 ENCOUNTER — Ambulatory Visit
Admission: RE | Admit: 2013-12-24 | Discharge: 2013-12-24 | Disposition: A | Discharge status: Home / Self Care | Payer: Medicare Other | Source: Ambulatory Visit

## 2013-12-24 DIAGNOSIS — Z1231 Encounter for screening mammogram for malignant neoplasm of breast: Secondary | ICD-10-CM

## 2013-12-26 ENCOUNTER — Encounter: Payer: Self-pay | Admitting: Internal Medicine

## 2014-03-31 ENCOUNTER — Other Ambulatory Visit: Payer: Self-pay | Admitting: Internal Medicine

## 2014-04-29 ENCOUNTER — Other Ambulatory Visit: Payer: Self-pay | Admitting: Internal Medicine

## 2014-07-28 ENCOUNTER — Encounter: Payer: Self-pay | Admitting: Family

## 2014-07-28 ENCOUNTER — Ambulatory Visit (INDEPENDENT_AMBULATORY_CARE_PROVIDER_SITE_OTHER): Payer: Medicare Other | Admitting: Family

## 2014-07-28 VITALS — BP 112/60 | HR 81 | Temp 99.0°F | Resp 18 | Ht 62.0 in | Wt 135.1 lb

## 2014-07-28 DIAGNOSIS — J069 Acute upper respiratory infection, unspecified: Secondary | ICD-10-CM

## 2014-07-28 MED ORDER — AZITHROMYCIN 250 MG PO TABS: ORAL_TABLET | ORAL | Status: DC

## 2014-07-28 NOTE — Progress Notes (Signed)
Pre visit review using our clinic review tool, if applicable. No additional management support is needed unless otherwise documented below in the visit note. 

## 2014-07-28 NOTE — Assessment & Plan Note (Signed)
Symptoms and exam consistent with upper respiratory infection. Has potential to be viral versus bacterial. Given prescription for azithromycin patient will wait 2-3 days before filling prescription we're starting antibiotics. If she is feeling better she will hold off on antibiotic. If she is getting worse she will start taking the azithromycin. Continue over-the-counter medication as needed for symptom relief. Follow up if symptoms worsen or fail to improve.

## 2014-07-28 NOTE — Progress Notes (Signed)
   Subjective:    Patient ID: Ruth Jones, female    DOB: 23-Mar-1946, 68 y.o.   MRN: [id number]  Chief Complaint  Patient presents with  . Cough    productive with congestion, headache, loss of voice, x3 days    HPI:  Ruth Jones is a 68 y.o. female who presents today for an acute visit.   Acute symptoms of productive cough, headache and loss of voice for about 3 days. Headache is described as generalized and doesn't really feel sinus related. Cough has disturbed sleep - but has tried Mucinex-DM. Denies fever, chills, body aches, vomiting or diarrhea. Has tried Emergen C, cough syrups and other over the counter medications with minimal relief.  Allergies  Allergen Reactions  . Levofloxacin     REACTION: GI Intolerance as "horrible stomach pain"  . Metoprolol Succinate     REACTION: Tachycardia  . Penicillins     REACTION: yeast  . Sulfonamide Derivatives     REACTION: itching   Current Outpatient Prescriptions on File Prior to Visit  Medication Sig Dispense Refill  . amLODipine (NORVASC) 2.5 MG tablet TAKE 1 TABLET BY MOUTH ONCE DAILY 30 tablet 5  . estradiol (VIVELLE-DOT) 0.0375 MG/24HR Place 1 patch onto the skin 2 (two) times a week.    . fluticasone (FLONASE) 50 MCG/ACT nasal spray PLACE 2 SPRAYS INTO THE NOSE DAILY 16 g 5  . losartan (COZAAR) 50 MG tablet 1/2 qd 30 tablet 2  . losartan (COZAAR) 50 MG tablet TAKE 1 TABLET BY MOUTH EVERY DAY 30 tablet 2  . Probiotic Product (ALIGN PO) Take by mouth.    . triamterene-hydrochlorothiazide (MAXZIDE-25) 37.5-25 MG per tablet Take 1 tablet by mouth daily.    . clidinium-chlordiazePOXIDE (LIBRAX) 5-2.5 MG per capsule TAKE 1 CAPSULE BY MOUTH EVERY 8 HOURS AS NEEDED (Patient not taking: Reported on 07/28/2014) 60 capsule 1  . DEXILANT 60 MG capsule TAKE ONE CAPSULE BY MOUTH ONE TIME DAILY (Patient not taking: Reported on 07/28/2014) 30 capsule 1   No current facility-administered medications on file prior to visit.    Review  of Systems    See HPI  Objective:    BP 112/60 mmHg  Pulse 81  Temp(Src) 99 F (37.2 C) (Oral)  Resp 18  Ht 5\' 2"  (1.575 m)  Wt 135 lb 1.9 oz (61.29 kg)  BMI 24.71 kg/m2  SpO2 98% Nursing note and vital signs reviewed.  Physical Exam  Constitutional: She is oriented to person, place, and time. She appears well-developed and well-nourished. No distress.  HENT:  Right Ear: Hearing, tympanic membrane, external ear and ear canal normal.  Left Ear: Hearing, tympanic membrane, external ear and ear canal normal.  Nose: Nose normal. Right sinus exhibits no maxillary sinus tenderness and no frontal sinus tenderness. Left sinus exhibits no maxillary sinus tenderness and no frontal sinus tenderness.  Mouth/Throat: Uvula is midline and mucous membranes are normal. Posterior oropharyngeal erythema present.  Cardiovascular: Normal rate, regular rhythm, normal heart sounds and intact distal pulses.   Pulmonary/Chest: Effort normal and breath sounds normal.  Neurological: She is alert and oriented to person, place, and time.  Skin: Skin is warm and dry.  Psychiatric: She has a normal mood and affect. Her behavior is normal. Judgment and thought content normal.       Assessment & Plan:

## 2014-07-28 NOTE — Patient Instructions (Addendum)
Thank you for choosing [facility].  Summary/Instructions:  Your prescription(s) have been submitted to your pharmacy. Please take as directed and contact our office if you believe you are having problem(s) with the medication(s).  If your symptoms worsen or fail to improve, please contact our office for further instruction, or in case of emergency go directly to the emergency room at the closest medical facility.    Upper Respiratory Infection, Adult An upper respiratory infection (URI) is also sometimes known as the common cold. The upper respiratory tract includes the nose, sinuses, throat, trachea, and bronchi. Bronchi are the airways leading to the lungs. Most people improve within 1 week, but symptoms can last up to 2 weeks. A residual cough may last even longer.  CAUSES Many different viruses can infect the tissues lining the upper respiratory tract. The tissues become irritated and inflamed and often become very moist. Mucus production is also common. A cold is contagious. You can easily spread the virus to others by oral contact. This includes kissing, sharing a glass, coughing, or sneezing. Touching your mouth or nose and then touching a surface, which is then touched by another person, can also spread the virus. SYMPTOMS  Symptoms typically develop 1 to 3 days after you come in contact with a cold virus. Symptoms vary from person to person. They may include:  Runny nose.  Sneezing.  Nasal congestion.  Sinus irritation.  Sore throat.  Loss of voice (laryngitis).  Cough.  Fatigue.  Muscle aches.  Loss of appetite.  Headache.  Low-grade fever. DIAGNOSIS  You might diagnose your own cold based on familiar symptoms, since most people get a cold 2 to 3 times a year. Your caregiver can confirm this based on your exam. Most importantly, your caregiver can check that your symptoms are not due to another disease such as strep throat, sinusitis, pneumonia, asthma, or  epiglottitis. Blood tests, throat tests, and X-rays are not necessary to diagnose a common cold, but they may sometimes be helpful in excluding other more serious diseases. Your caregiver will decide if any further tests are required. RISKS AND COMPLICATIONS  You may be at risk for a more severe case of the common cold if you smoke cigarettes, have chronic heart disease (such as heart failure) or lung disease (such as asthma), or if you have a weakened immune system. The very young and very old are also at risk for more serious infections. Bacterial sinusitis, middle ear infections, and bacterial pneumonia can complicate the common cold. The common cold can worsen asthma and chronic obstructive pulmonary disease (COPD). Sometimes, these complications can require emergency medical care and may be life-threatening. PREVENTION  The best way to protect against getting a cold is to practice good hygiene. Avoid oral or hand contact with people with cold symptoms. Wash your hands often if contact occurs. There is no clear evidence that vitamin C, vitamin E, echinacea, or exercise reduces the chance of developing a cold. However, it is always recommended to get plenty of rest and practice good nutrition. TREATMENT  Treatment is directed at relieving symptoms. There is no cure. Antibiotics are not effective, because the infection is caused by a virus, not by bacteria. Treatment may include:  Increased fluid intake. Sports drinks offer valuable electrolytes, sugars, and fluids.  Breathing heated mist or steam (vaporizer or shower).  Eating chicken soup or other clear broths, and maintaining good nutrition.  Getting plenty of rest.  Using gargles or lozenges for comfort.  Controlling fevers   with ibuprofen or acetaminophen as directed by your caregiver.  Increasing usage of your inhaler if you have asthma. Zinc gel and zinc lozenges, taken in the first 24 hours of the common cold, can shorten the duration  and lessen the severity of symptoms. Pain medicines may help with fever, muscle aches, and throat pain. A variety of non-prescription medicines are available to treat congestion and runny nose. Your caregiver can make recommendations and may suggest nasal or lung inhalers for other symptoms.  HOME CARE INSTRUCTIONS   Only take over-the-counter or prescription medicines for pain, discomfort, or fever as directed by your caregiver.  Use a warm mist humidifier or inhale steam from a shower to increase air moisture. This may keep secretions moist and make it easier to breathe.  Drink enough water and fluids to keep your urine clear or pale yellow.  Rest as needed.  Return to work when your temperature has returned to normal or as your caregiver advises. You may need to stay home longer to avoid infecting others. You can also use a face mask and careful hand washing to prevent spread of the virus. SEEK MEDICAL CARE IF:   After the first few days, you feel you are getting worse rather than better.  You need your caregiver's advice about medicines to control symptoms.  You develop chills, worsening shortness of breath, or brown or red sputum. These may be signs of pneumonia.  You develop yellow or brown nasal discharge or pain in the face, especially when you bend forward. These may be signs of sinusitis.  You develop a fever, swollen neck glands, pain with swallowing, or white areas in the back of your throat. These may be signs of strep throat. SEEK IMMEDIATE MEDICAL CARE IF:   You have a fever.  You develop severe or persistent headache, ear pain, sinus pain, or chest pain.  You develop wheezing, a prolonged cough, cough up blood, or have a change in your usual mucus (if you have chronic lung disease).  You develop sore muscles or a stiff neck. Document Released: 01/31/2001 Document Revised: 10/30/2011 Document Reviewed: 11/12/2013 ExitCare Patient Information 2015 [employer]. This  information is not intended to replace advice given to you by your health care provider. Make sure you discuss any questions you have with your health care provider.  

## 2014-07-29 ENCOUNTER — Other Ambulatory Visit: Payer: Self-pay | Admitting: Family

## 2014-07-29 ENCOUNTER — Telehealth: Payer: Self-pay | Admitting: Internal Medicine

## 2014-07-29 MED ORDER — HYDROCODONE-HOMATROPINE 5-1.5 MG/5ML PO SYRP: 5.0000 mL | ORAL_SOLUTION | Freq: Three times a day (TID) | ORAL | Status: DC | PRN

## 2014-07-29 NOTE — Telephone Encounter (Signed)
I have printed her a script for cough medicine with hydrocodone. She will have to come and pick it up because it is a controlled substance. If should would like something else, please let me know.

## 2014-07-29 NOTE — Telephone Encounter (Signed)
Pt called back and wants this prescription she will pick up at office today.

## 2014-07-29 NOTE — Telephone Encounter (Signed)
Left message at pt's home vm she could pick this prescription up at the office and to give [place] a call back.

## 2014-07-29 NOTE — Telephone Encounter (Signed)
Pt seen 12/8 taking OTC and started antibiotic. Pt has decided she would like something for her cough, she is asking if you would be so kind as to call something in for cough as it is worse. [facility] / Renie Ora

## 2014-08-17 ENCOUNTER — Telehealth: Payer: Self-pay | Admitting: Internal Medicine

## 2014-08-17 NOTE — Telephone Encounter (Signed)
Patient seen Ruth Jones beginning of December and was prescribed a zpak.  She has finished the zpak and is still having cough and congestion in chest.  She would like to know if she needs to make another appointment or if she needs something else called in to her pharmacy.

## 2014-08-17 NOTE — Telephone Encounter (Signed)
She should be seen; may need CXray

## 2014-08-18 ENCOUNTER — Encounter: Payer: Self-pay | Admitting: Internal Medicine

## 2014-08-18 ENCOUNTER — Other Ambulatory Visit: Payer: Self-pay | Admitting: Internal Medicine

## 2014-08-18 DIAGNOSIS — R059 Cough, unspecified: Secondary | ICD-10-CM

## 2014-08-18 DIAGNOSIS — R05 Cough: Secondary | ICD-10-CM

## 2014-08-19 ENCOUNTER — Ambulatory Visit (INDEPENDENT_AMBULATORY_CARE_PROVIDER_SITE_OTHER)
Admission: RE | Admit: 2014-08-19 | Discharge: 2014-08-19 | Disposition: A | Discharge status: Home / Self Care | Payer: BC Managed Care – PPO | Source: Ambulatory Visit | Attending: Internal Medicine | Admitting: Internal Medicine

## 2014-08-19 DIAGNOSIS — R05 Cough: Secondary | ICD-10-CM

## 2014-08-19 DIAGNOSIS — R059 Cough, unspecified: Secondary | ICD-10-CM

## 2014-08-21 HISTORY — PX: MOHS SURGERY: SUR867

## 2014-08-24 ENCOUNTER — Encounter: Payer: Self-pay | Admitting: Internal Medicine

## 2014-08-24 ENCOUNTER — Ambulatory Visit (INDEPENDENT_AMBULATORY_CARE_PROVIDER_SITE_OTHER): Payer: Medicare Other | Admitting: Internal Medicine

## 2014-08-24 VITALS — BP 134/76 | HR 63 | Temp 97.7°F | Ht 62.0 in | Wt 126.0 lb

## 2014-08-24 DIAGNOSIS — R0989 Other specified symptoms and signs involving the circulatory and respiratory systems: Secondary | ICD-10-CM

## 2014-08-24 DIAGNOSIS — K219 Gastro-esophageal reflux disease without esophagitis: Secondary | ICD-10-CM

## 2014-08-24 DIAGNOSIS — R198 Other specified symptoms and signs involving the digestive system and abdomen: Secondary | ICD-10-CM

## 2014-08-24 DIAGNOSIS — R059 Cough, unspecified: Secondary | ICD-10-CM

## 2014-08-24 DIAGNOSIS — F458 Other somatoform disorders: Secondary | ICD-10-CM

## 2014-08-24 DIAGNOSIS — R05 Cough: Secondary | ICD-10-CM

## 2014-08-24 MED ORDER — DEXLANSOPRAZOLE 60 MG PO CPDR: 60.0000 mg | DELAYED_RELEASE_CAPSULE | Freq: Every day | ORAL | Status: DC

## 2014-08-24 MED ORDER — BENZONATATE 200 MG PO CAPS: 200.0000 mg | ORAL_CAPSULE | Freq: Three times a day (TID) | ORAL | Status: DC | PRN

## 2014-08-24 NOTE — Progress Notes (Signed)
   Subjective:    Patient ID: Ruth Jones, female    DOB: November 11, 1945, 69 y.o.   MRN: [id number]  HPI  She describes a cough which is almost constant associated with persistent throat clearing. This failed to respond to the course of antibiotics & narcotic cough syrup.   She describes some scant postnasal drainage  She will have some minor amount of yellow secretions from the chest in the morning  She has intermittent hoarseness  She has noted Mucinex DM has been of some benefit at night for cough.  She denies other upper respiratory tract infection symptoms and GERD symptoms.  She did have some erosions at endoscopy which she stated were "minor". She's not been using Dexilant.  Never smoked.  No PMH of asthma.  Review of Systems Frontal headache, facial pain , nasal purulence, dental pain, sore throat , otic pain or otic discharge denied.   No fever , chills or sweats.Unexplained weight loss, abdominal pain, significant dyspepsia, dysphagia, melena, rectal bleeding, or persistently small caliber stools are denied.     Objective:   Physical Exam  General appearance:good health ;well nourished; no acute distress or increased work of breathing is present.  No  lymphadenopathy about the head, neck, or axilla noted.   Eyes: No conjunctival inflammation or lid edema is present. There is no scleral icterus.  Ears:  External ear exam shows no significant lesions or deformities.  Otoscopic examination reveals clear canals, tympanic membranes are intact bilaterally without bulging, retraction, inflammation or discharge.  Nose:  External nasal examination shows no deformity or inflammation. Nasal mucosa are pink and moist without lesions or exudates. No septal dislocation or deviation.No obstruction to airflow.   Oral exam: Dental hygiene is good; lips and gums are healthy appearing.There is no oropharyngeal erythema or exudate noted. Repetitive throat clearing  Neck:  No  deformities, thyromegaly, masses, or tenderness noted.   Supple with full range of motion without pain.   Heart:  Normal rate and regular rhythm. S1 and S2 normal without gallop, murmur, click, rub or other extra sounds.   Lungs:Chest clear to auscultation; no wheezes, rhonchi,rales ,or rubs present.No increased work of breathing.    Extremities:  No cyanosis, edema, or clubbing  noted    Skin: Warm & dry w/o jaundice or tenting.       Assessment & Plan:  #1 cough #2 globus #3 GERD  The etiology of her symptoms could be insidious rhinitis or more likely reflux.  She'll be asked to treat both of these aggressively.  Referral will be made to Dr. Melvyn Novas as he is an expert in this area of chronic cough.

## 2014-08-24 NOTE — Progress Notes (Signed)
Pre visit review using our clinic review tool, if applicable. No additional management support is needed unless otherwise documented below in the visit note. 

## 2014-08-24 NOTE — Patient Instructions (Addendum)
Plain Mucinex (NOT D) for thick secretions ;force NON dairy fluids .   Nasal cleansing in the shower as discussed with lather of mild shampoo.After 10 seconds wash off lather while  exhaling through nostrils. Make sure that all residual soap is removed to prevent irritation.  Flonase OR Nasacort AQ 1 spray in each nostril twice a day as needed. Use the "crossover" technique into opposite nostril spraying toward opposite ear @ 45 degree angle, not straight up into nostril.  Plain Allegra (NOT D )  160 daily , Loratidine 10 mg , OR Zyrtec 10 mg @ bedtime  as needed for itchy eyes & sneezing.  Reflux of gastric acid may be asymptomatic as this may occur mainly during sleep.The triggers for reflux  include stress; the "aspirin family" ; alcohol; peppermint; and caffeine (coffee, tea, cola, and chocolate). The aspirin family would include aspirin and the nonsteroidal agents such as ibuprofen &  Naproxen. Tylenol would not cause reflux. If having symptoms ; food & drink should be avoided for @ least 2 hours before going to bed.    Carry room temperature water and sip liberally after coughing.

## 2014-09-11 ENCOUNTER — Institutional Professional Consult (permissible substitution): Payer: Medicare Other | Admitting: Internal Medicine

## 2014-10-15 ENCOUNTER — Other Ambulatory Visit: Payer: Self-pay | Admitting: Internal Medicine

## 2014-10-15 NOTE — Telephone Encounter (Signed)
Script for Librax has been faxed to [facility] @ [phone]

## 2014-11-23 ENCOUNTER — Other Ambulatory Visit: Payer: Self-pay | Admitting: Internal Medicine

## 2014-12-10 ENCOUNTER — Other Ambulatory Visit: Payer: Self-pay

## 2014-12-10 DIAGNOSIS — Z1231 Encounter for screening mammogram for malignant neoplasm of breast: Secondary | ICD-10-CM

## 2014-12-30 ENCOUNTER — Ambulatory Visit
Admission: RE | Admit: 2014-12-30 | Discharge: 2014-12-30 | Disposition: A | Discharge status: Home / Self Care | Payer: Medicare Other | Source: Ambulatory Visit

## 2014-12-30 DIAGNOSIS — Z1231 Encounter for screening mammogram for malignant neoplasm of breast: Secondary | ICD-10-CM

## 2014-12-31 ENCOUNTER — Other Ambulatory Visit: Payer: Self-pay | Admitting: Obstetrics & Gynecology

## 2014-12-31 DIAGNOSIS — R928 Other abnormal and inconclusive findings on diagnostic imaging of breast: Secondary | ICD-10-CM

## 2015-01-07 ENCOUNTER — Ambulatory Visit
Admission: RE | Admit: 2015-01-07 | Discharge: 2015-01-07 | Disposition: A | Discharge status: Home / Self Care | Payer: Medicare Other | Source: Ambulatory Visit | Attending: Obstetrics & Gynecology | Admitting: Obstetrics & Gynecology

## 2015-01-07 DIAGNOSIS — R928 Other abnormal and inconclusive findings on diagnostic imaging of breast: Secondary | ICD-10-CM

## 2015-02-01 ENCOUNTER — Telehealth: Payer: Self-pay

## 2015-02-01 NOTE — Telephone Encounter (Signed)
Call to the patient but no answer and did not leave a message as apt is tomorrow/ may attempt one more outreach if time.

## 2015-02-02 ENCOUNTER — Encounter: Payer: Self-pay | Admitting: Internal Medicine

## 2015-02-02 ENCOUNTER — Ambulatory Visit (INDEPENDENT_AMBULATORY_CARE_PROVIDER_SITE_OTHER): Payer: Medicare Other | Admitting: Internal Medicine

## 2015-02-02 ENCOUNTER — Other Ambulatory Visit: Payer: Self-pay | Admitting: Internal Medicine

## 2015-02-02 ENCOUNTER — Other Ambulatory Visit (INDEPENDENT_AMBULATORY_CARE_PROVIDER_SITE_OTHER): Payer: Medicare Other

## 2015-02-02 VITALS — BP 112/68 | HR 57 | Temp 97.6°F | Resp 14 | Ht 62.0 in | Wt 124.0 lb

## 2015-02-02 DIAGNOSIS — Z0000 Encounter for general adult medical examination without abnormal findings: Secondary | ICD-10-CM

## 2015-02-02 DIAGNOSIS — Z23 Encounter for immunization: Secondary | ICD-10-CM

## 2015-02-02 DIAGNOSIS — M8580 Other specified disorders of bone density and structure, unspecified site: Secondary | ICD-10-CM

## 2015-02-02 DIAGNOSIS — K5730 Diverticulosis of large intestine without perforation or abscess without bleeding: Secondary | ICD-10-CM

## 2015-02-02 DIAGNOSIS — M859 Disorder of bone density and structure, unspecified: Secondary | ICD-10-CM

## 2015-02-02 DIAGNOSIS — E785 Hyperlipidemia, unspecified: Secondary | ICD-10-CM

## 2015-02-02 DIAGNOSIS — Z85828 Personal history of other malignant neoplasm of skin: Secondary | ICD-10-CM

## 2015-02-02 DIAGNOSIS — I10 Essential (primary) hypertension: Secondary | ICD-10-CM

## 2015-02-02 HISTORY — DX: Personal history of other malignant neoplasm of skin: Z85.828

## 2015-02-02 LAB — CBC WITH DIFFERENTIAL/PLATELET
Basophils Absolute: 0 10*3/uL (ref 0.0–0.1)
Basophils Relative: 0.7 % (ref 0.0–3.0)
EOS PCT: 3 % (ref 0.0–5.0)
Eosinophils Absolute: 0.2 10*3/uL (ref 0.0–0.7)
HCT: 43.5 % (ref 36.0–46.0)
Hemoglobin: 14.9 g/dL (ref 12.0–15.0)
LYMPHS PCT: 28.6 % (ref 12.0–46.0)
Lymphs Abs: 1.6 10*3/uL (ref 0.7–4.0)
MCHC: 34.4 g/dL (ref 30.0–36.0)
MCV: 94.4 fl (ref 78.0–100.0)
MONOS PCT: 10.5 % (ref 3.0–12.0)
Monocytes Absolute: 0.6 10*3/uL (ref 0.1–1.0)
NEUTROS PCT: 57.2 % (ref 43.0–77.0)
Neutro Abs: 3.2 10*3/uL (ref 1.4–7.7)
Platelets: 258 10*3/uL (ref 150.0–400.0)
RBC: 4.6 Mil/uL (ref 3.87–5.11)
RDW: 12.8 % (ref 11.5–15.5)
WBC: 5.6 10*3/uL (ref 4.0–10.5)

## 2015-02-02 LAB — HEPATIC FUNCTION PANEL
ALT: 21 U/L (ref 0–35)
AST: 24 U/L (ref 0–37)
Albumin: 4.4 g/dL (ref 3.5–5.2)
Alkaline Phosphatase: 54 U/L (ref 39–117)
BILIRUBIN DIRECT: 0.2 mg/dL (ref 0.0–0.3)
Total Bilirubin: 1.3 mg/dL — ABNORMAL HIGH (ref 0.2–1.2)
Total Protein: 6.9 g/dL (ref 6.0–8.3)

## 2015-02-02 LAB — BASIC METABOLIC PANEL
BUN: 14 mg/dL (ref 6–23)
CHLORIDE: 100 meq/L (ref 96–112)
CO2: 32 meq/L (ref 19–32)
Calcium: 9.5 mg/dL (ref 8.4–10.5)
Creatinine, Ser: 0.89 mg/dL (ref 0.40–1.20)
GFR: 66.49 mL/min (ref >60.00)
GLUCOSE: 90 mg/dL (ref 70–99)
Potassium: 3.7 mEq/L (ref 3.5–5.1)
Sodium: 136 mEq/L (ref 135–145)

## 2015-02-02 LAB — VITAMIN D 25 HYDROXY (VIT D DEFICIENCY, FRACTURES): VITD: 37.62 ng/mL (ref 30.00–100.00)

## 2015-02-02 LAB — TSH: TSH: 1.74 u[IU]/mL (ref 0.35–4.50)

## 2015-02-02 NOTE — Progress Notes (Signed)
Subjective:    Patient ID: Ruth Jones, female    DOB: Feb 23, 1946, 69 y.o.   MRN: [id number]  HPI Medicare Wellness Visit: Psychosocial and medical history were reviewed as required by Medicare (history related to abuse, antisocial behavior , firearm risk). Social history: Caffeine:  2 mugs daily Alcohol:  10-12 glasses of wine weekly Tobacco use: Never Exercise: Walking 5 times a week for 40 minutes without any pulmonary symptoms  Personal safety/fall risk: No Limitations of activities of daily living: No Seatbelt/ smoke alarm use: Yes Healthcare Power of Attorney/Living Will status and End of Life process assessment : Not up-to-date, importance discussed Ophthalmologic exam status: Current Hearing evaluation status: Current, she sees Dr.Kraus annually Orientation: Oriented X 3 Memory and recall: Good Spelling or math testing: Good Depression/anxiety assessment: Denied Foreign travel history: [place] 3/16 Immunization status for influenza/pneumonia/ shingles /tetanus: Reviewed by [employer] due Transfusion history: No Preventive health care maintenance status: Colonoscopy/BMD/mammogram/Pap as per protocol/standard care: Colonoscopy 2011;BMD due Dental care: Current Chart reviewed and updated. Active issues reviewed and addressed as documented below.   She does eat fried foods but not excessive red meat. She walks 5 times a week for 40 minutes without cardiopulmonary symptoms. She is not checking her blood pressure. She does not take the amlodipine in the Summer as she does not have Raynaud's phenomena in warm weather.  She does have some leg discomfort upon awakening but not with exercise. She feels that she does have some polydipsia. She will have nocturia 1-2 times per night.  In March of this year she had a melanoma removed from the right medial calf. 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  She is on Librax and Dexilant as needed only.  She has no active significant GI symptoms at this time. She had an upper endoscopy in November 2011. This was normal.  Colonoscopy was performed the same date revealed severe diverticulosis in the left: colon.    Review of Systems  Chest pain, palpitations, tachycardia, exertional dyspnea, paroxysmal nocturnal dyspnea, claudication or edema are absent. No unexplained weight loss, abdominal pain, significant dyspepsia, dysphagia, melena, rectal bleeding, or persistently small caliber stools. Dysuria, pyuria, hematuria, frequency, or polyuria are denied. Change in hair, skin, nails denied. No bowel changes of constipation or diarrhea. No intolerance to heat or cold.     Objective:   Physical Exam  Gen.: Adequately nourished in appearance. Alert, appropriate and cooperative throughout exam. BMI:22.67 Appears younger than stated age  Head: Normocephalic without obvious abnormalities  Eyes: No corneal or conjunctival inflammation noted. Pupils equal round reactive to light and accommodation. Extraocular motion intact.  Ears: External  ear exam reveals no significant lesions or deformities. Canals clear .TMs normal. Hearing is grossly normal bilaterally. Nose: External nasal exam reveals no deformity or inflammation. Nasal mucosa are pink and moist. No lesions or exudates noted.   Mouth: Oral mucosa and oropharynx reveal no lesions or exudates. Teeth in good repair. Neck: No deformities, masses, or tenderness noted. Range of motion &Thyroid normal Lungs: Normal respiratory effort; chest expands symmetrically. Lungs are clear to auscultation without rales, wheezes, or increased work of breathing. Heart: Normal rate and rhythm. Normal S1 and S2. No gallop, click, or rub. No murmur. Abdomen: Bowel sounds normal; abdomen soft and nontender. No masses, organomegaly or hernias noted.Aorta palpable ; no AAA Genitalia:  as per Gyn  Musculoskeletal/extremities:  Slightly  accentuated curvature of upper thoracic spine. No clubbing, cyanosis, edema, or significant extremity  deformity noted.  Range of motion normal . Tone & strength normal. Hand joints normal.  Fingernail  health good. Slight crepitus of knees  Able to lie down & sit up w/o help.  Negative SLR bilaterally Vascular: Carotid, radial artery, dorsalis pedis and  posterior tibial pulses are full and equal. No bruits present. Neurologic: Alert and oriented x3. Deep tendon reflexes symmetrical and normal.  Gait normal        Skin: Intact without suspicious lesions or rashes. Lymph: No cervical, axillary lymphadenopathy present. Psych: Mood and affect are normal. Normally interactive                                                                                       Assessment & Plan:  See Current Assessment & Plan in Problem List under specific DiagnosisThe labs will be reviewed and risks and options assessed. Written recommendations will be provided by mail or directly through My Chart.Further evaluation or change in medical therapy will be directed by those results.

## 2015-02-02 NOTE — Assessment & Plan Note (Signed)
Vit D level BMD

## 2015-02-02 NOTE — Patient Instructions (Signed)
Minimal Blood Pressure Goal= AVERAGE < 140/90;  Ideal is an AVERAGE < 135/85. This AVERAGE should be calculated from @ least 5-7 BP readings taken @ different times of day on different days of week. You should not respond to isolated BP readings , but rather the AVERAGE for that week .Please bring your  blood pressure cuff to office visits to verify that it is reliable.It  can also be checked against the blood pressure device at the pharmacy. Finger or wrist cuffs are not dependable; an arm cuff is. Your next office appointment will be determined based upon review of your pending labs  and  BMD  Those written interpretation of the lab results and instructions will be transmitted to you by My Chart  Critical results will be called.   Followup as needed for any active or acute issue. Please report any significant change in your symptoms.

## 2015-02-02 NOTE — Assessment & Plan Note (Signed)
CBC

## 2015-02-02 NOTE — Assessment & Plan Note (Signed)
NMR Lipoprofile, LFT, TSH

## 2015-02-02 NOTE — Assessment & Plan Note (Signed)
Blood pressure goals reviewed. BMET 

## 2015-02-02 NOTE — Progress Notes (Signed)
Patient received education resource, including the self-management goal and tool. Patient verbalized understanding. 

## 2015-02-03 ENCOUNTER — Encounter: Payer: Self-pay | Admitting: Internal Medicine

## 2015-02-04 LAB — NMR LIPOPROFILE WITH LIPIDS
Cholesterol, Total: 213 mg/dL — ABNORMAL HIGH (ref 100–199)
HDL PARTICLE NUMBER: 40.6 umol/L (ref >=30.5)
HDL Size: 9.8 nm (ref >=9.2)
HDL-C: 81 mg/dL (ref >39)
LDL (calc): 113 mg/dL — ABNORMAL HIGH (ref 0–99)
LDL Particle Number: 1220 nmol/L — ABNORMAL HIGH (ref <1000)
LDL Size: 21.8 nm (ref >=20.8)
LP-IR Score: <25 (ref <=45)
Large HDL-P: 13.6 umol/L (ref >=4.8)
Large VLDL-P: 1.4 nmol/L (ref <=2.7)
SMALL LDL PARTICLE NUMBER: 156 nmol/L (ref <=527)
Triglycerides: 93 mg/dL (ref 0–149)
VLDL SIZE: 44.5 nm (ref <=46.6)

## 2015-02-24 ENCOUNTER — Encounter: Payer: Self-pay | Admitting: Internal Medicine

## 2015-02-26 ENCOUNTER — Encounter: Payer: Self-pay | Admitting: Internal Medicine

## 2015-03-01 ENCOUNTER — Ambulatory Visit (INDEPENDENT_AMBULATORY_CARE_PROVIDER_SITE_OTHER)
Admission: RE | Admit: 2015-03-01 | Discharge: 2015-03-01 | Disposition: A | Discharge status: Home / Self Care | Payer: Medicare Other | Source: Ambulatory Visit | Attending: Internal Medicine | Admitting: Internal Medicine

## 2015-03-01 DIAGNOSIS — M859 Disorder of bone density and structure, unspecified: Secondary | ICD-10-CM

## 2015-03-01 DIAGNOSIS — M8580 Other specified disorders of bone density and structure, unspecified site: Secondary | ICD-10-CM | POA: Diagnosis not present

## 2015-03-21 ENCOUNTER — Encounter: Payer: Self-pay | Admitting: Internal Medicine

## 2015-05-09 ENCOUNTER — Encounter: Payer: Self-pay | Admitting: Internal Medicine

## 2015-06-20 ENCOUNTER — Encounter: Payer: Self-pay | Admitting: Internal Medicine

## 2015-06-24 ENCOUNTER — Ambulatory Visit (INDEPENDENT_AMBULATORY_CARE_PROVIDER_SITE_OTHER): Payer: Medicare Other

## 2015-06-24 DIAGNOSIS — Z23 Encounter for immunization: Secondary | ICD-10-CM

## 2015-07-13 ENCOUNTER — Other Ambulatory Visit: Payer: Self-pay | Admitting: Internal Medicine

## 2015-07-18 ENCOUNTER — Other Ambulatory Visit: Payer: Self-pay | Admitting: Internal Medicine

## 2015-07-19 ENCOUNTER — Encounter: Payer: Self-pay | Admitting: Internal Medicine

## 2015-07-19 NOTE — Telephone Encounter (Signed)
Can you schedule the pneumonia inj for pt and send a my chart message.

## 2015-07-22 ENCOUNTER — Encounter: Payer: Self-pay | Admitting: Internal Medicine

## 2015-07-23 ENCOUNTER — Other Ambulatory Visit: Payer: Self-pay | Admitting: Internal Medicine

## 2015-07-23 ENCOUNTER — Telehealth: Payer: Self-pay | Admitting: Internal Medicine

## 2015-07-23 DIAGNOSIS — Z1159 Encounter for screening for other viral diseases: Secondary | ICD-10-CM

## 2015-07-23 NOTE — Telephone Encounter (Signed)
Pt informed. Pt will send a my chart message to request a nurse visit

## 2015-07-23 NOTE — Telephone Encounter (Signed)
Both OK orders entered

## 2015-07-23 NOTE — Telephone Encounter (Signed)
Pt has been scheduled for Wednesday at 10:15am.  Message sent to pt.

## 2015-07-23 NOTE — Telephone Encounter (Signed)
Is requesting Hep C lab order to be entered.  Also is requesting pneumonia vac.  Please advise.

## 2015-07-28 ENCOUNTER — Other Ambulatory Visit: Payer: Medicare Other

## 2015-07-28 ENCOUNTER — Ambulatory Visit (INDEPENDENT_AMBULATORY_CARE_PROVIDER_SITE_OTHER): Payer: Medicare Other

## 2015-07-28 DIAGNOSIS — Z23 Encounter for immunization: Secondary | ICD-10-CM | POA: Diagnosis not present

## 2015-07-28 DIAGNOSIS — Z1159 Encounter for screening for other viral diseases: Secondary | ICD-10-CM

## 2015-07-29 LAB — HEPATITIS C ANTIBODY: HCV Ab: NEGATIVE

## 2015-08-12 ENCOUNTER — Encounter: Payer: Self-pay | Admitting: Internal Medicine

## 2015-08-20 ENCOUNTER — Other Ambulatory Visit (INDEPENDENT_AMBULATORY_CARE_PROVIDER_SITE_OTHER): Payer: Medicare Other

## 2015-08-20 ENCOUNTER — Encounter: Payer: Self-pay | Admitting: Internal Medicine

## 2015-08-20 ENCOUNTER — Ambulatory Visit (INDEPENDENT_AMBULATORY_CARE_PROVIDER_SITE_OTHER): Payer: Medicare Other | Admitting: Internal Medicine

## 2015-08-20 VITALS — BP 120/80 | HR 68 | Temp 98.2°F | Ht 62.0 in | Wt 123.0 lb

## 2015-08-20 DIAGNOSIS — I10 Essential (primary) hypertension: Secondary | ICD-10-CM | POA: Diagnosis not present

## 2015-08-20 DIAGNOSIS — M8580 Other specified disorders of bone density and structure, unspecified site: Secondary | ICD-10-CM

## 2015-08-20 DIAGNOSIS — E785 Hyperlipidemia, unspecified: Secondary | ICD-10-CM | POA: Diagnosis not present

## 2015-08-20 HISTORY — DX: Gilbert syndrome: E80.4

## 2015-08-20 LAB — VITAMIN D 25 HYDROXY (VIT D DEFICIENCY, FRACTURES): VITD: 32.56 ng/mL (ref 30.00–100.00)

## 2015-08-20 NOTE — Patient Instructions (Signed)
Minimal Blood Pressure Goal= AVERAGE < 140/90;  Ideal is an AVERAGE < 135/85. This AVERAGE should be calculated from @ least 5-7 BP readings taken @ different times of day on different days of week. You should not respond to isolated BP readings , but rather the AVERAGE for that week .Please bring your  blood pressure cuff to office visits to verify that it is reliable.It  can also be checked against the blood pressure device at the pharmacy. Finger or wrist cuffs are not dependable; an arm cuff is.  Consider glucosamine sulfate 1500 mg daily for joint symptoms. Take this daily  for 3 months and then leave it off for 2 months. This will rehydrate the cartilages. Your next office appointment will be determined based upon review of your pending labs.  Those written interpretation of the lab results and instructions will be transmitted to you by My Chart  Critical results will be called.   Followup as needed for any active or acute issue. Please report any significant change in your symptoms.

## 2015-08-20 NOTE — Progress Notes (Signed)
   Subjective:    Patient ID: Franki Cabot, female    DOB: [birth year], 69 y.o.   MRN: [id number]  HPI The patient is here to assess status of active health conditions.  PMH, FH, & Social History reviewed & updated.No change in [place] as recorded.  She's on a heart healthy diet. She walks 5 days-7 days per week 30-45 minutes without cardio pulmonary symptoms. She has decreased her losartan to 50 mg one half pill daily  She does describe some polydipsia at night and has nocturia twice nightly. She also has cold intolerance.  Labs were completed in June of this year. She was concerned that thee total cholesterol was 213. This is due due to an HDL of 81.  Her glucose was 90 at that time. Renal function was normal. TSH was therapeutic  She did have an elevated total bilirubin 1.3.AST and ALT were normal. 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   At her request we also reviewed her bone density which showed mild osteopenia at the hip with a value of -1.4. Her vitamin D level is not current.  Review of Systems  Chest pain, palpitations, tachycardia, exertional dyspnea, paroxysmal nocturnal dyspnea, claudication or edema are absent. No unexplained weight loss, abdominal pain, significant dyspepsia, dysphagia, melena, rectal bleeding, or persistently small caliber stools. Dysuria, pyuria, hematuria, frequency, or polyuria are denied. Change in hair, skin, nails denied. No bowel changes of constipation or diarrhea. No intolerance to heat .    Objective:   Physical Exam   Pertinent or positive findings include: She has decreased hearing greater on the left than the right. Crepitus is present in the knees.  General appearance :adequately nourished; in no distress.  Eyes: No conjunctival inflammation or scleral icterus is present.  Oral exam:  Lips and gums are healthy appearing.There is no oropharyngeal erythema or exudate noted. Dental hygiene is good.  Heart:  Normal rate and  regular rhythm. S1 and S2 normal without gallop, murmur, click, rub or other extra sounds    Lungs:Chest clear to auscultation; no wheezes, rhonchi,rales ,or rubs present.No increased work of breathing.   Abdomen: bowel sounds normal, soft and non-tender without masses, organomegaly or hernias noted.  No guarding or rebound. No flank tenderness to percussion.  Vascular : all pulses equal ; no bruits present.  Skin:Warm & dry.  Intact without suspicious lesions or rashes ; no tenting or jaundice   Lymphatic: No lymphadenopathy is noted about the head, neck, axilla, or inguinal areas.   Neuro: Strength, tone & DTRs normal.      Assessment & Plan:  See Current Assessment & Plan in Problem List under specific Diagnosis

## 2015-08-20 NOTE — Progress Notes (Signed)
Pre visit review using our clinic review tool, if applicable. No additional management support is needed unless otherwise documented below in the visit note. 

## 2015-08-20 NOTE — Assessment & Plan Note (Signed)
Vitamin D level 

## 2015-08-20 NOTE — Assessment & Plan Note (Signed)
Blood pressure goals reviewed. BMET reviewed 

## 2015-08-20 NOTE — Assessment & Plan Note (Signed)
Elevated TC due to high HDL; discussed

## 2015-08-24 ENCOUNTER — Other Ambulatory Visit: Payer: Self-pay | Admitting: Internal Medicine

## 2015-12-06 ENCOUNTER — Other Ambulatory Visit: Payer: Self-pay

## 2015-12-06 DIAGNOSIS — Z1231 Encounter for screening mammogram for malignant neoplasm of breast: Secondary | ICD-10-CM

## 2016-01-06 ENCOUNTER — Ambulatory Visit: Payer: Medicare Other

## 2016-01-19 ENCOUNTER — Other Ambulatory Visit: Payer: Self-pay | Admitting: Internal Medicine

## 2016-04-04 ENCOUNTER — Ambulatory Visit (INDEPENDENT_AMBULATORY_CARE_PROVIDER_SITE_OTHER): Payer: Medicare Other | Admitting: Internal Medicine

## 2016-04-04 ENCOUNTER — Encounter: Payer: Self-pay | Admitting: Internal Medicine

## 2016-04-04 VITALS — BP 128/76 | HR 80 | Temp 98.2°F | Resp 16 | Ht 62.0 in | Wt 125.0 lb

## 2016-04-04 DIAGNOSIS — I7300 Raynaud's syndrome without gangrene: Secondary | ICD-10-CM | POA: Insufficient documentation

## 2016-04-04 DIAGNOSIS — I10 Essential (primary) hypertension: Secondary | ICD-10-CM | POA: Diagnosis not present

## 2016-04-04 DIAGNOSIS — E785 Hyperlipidemia, unspecified: Secondary | ICD-10-CM

## 2016-04-04 DIAGNOSIS — K219 Gastro-esophageal reflux disease without esophagitis: Secondary | ICD-10-CM | POA: Diagnosis not present

## 2016-04-04 DIAGNOSIS — H8102 Meniere's disease, left ear: Secondary | ICD-10-CM

## 2016-04-04 HISTORY — DX: Raynaud's syndrome without gangrene: I73.00

## 2016-04-04 NOTE — Assessment & Plan Note (Signed)
Continue Maxide-prescribed by Dr. Thornell Mule

## 2016-04-04 NOTE — Assessment & Plan Note (Addendum)
improvement with amlodipine 2.5 mg daily Still has symptoms on occasion symptoms Discussed warm socks, gloves as needed and trying to stay warm Discussed that she can try increasing the amlodipine 5 mg daily and holding the losartan to see if that helps-if she decides to do that she will let me know so we can update her medication list

## 2016-04-04 NOTE — Assessment & Plan Note (Addendum)
GERD improved She is currently taking Only as needed Continue prn medication

## 2016-04-04 NOTE — Patient Instructions (Addendum)
  Test(s) ordered today. Your results will be released to MyChart (or called to you) after review, usually within 72hours after test completion. If any changes need to be made, you will be notified at that same time.  All other Health Maintenance issues reviewed.   All recommended immunizations and age-appropriate screenings are up-to-date or discussed.  No immunizations administered today.   Medications reviewed and updated.  No changes recommended at this time.   Please followup in one year   

## 2016-04-04 NOTE — Progress Notes (Signed)
Pre visit review using our clinic review tool, if applicable. No additional management support is needed unless otherwise documented below in the visit note. 

## 2016-04-04 NOTE — Progress Notes (Signed)
Subjective:    Patient ID: Ruth Jones, female    DOB: Nov 04, 1945, 70 y.o.   MRN: [id number]  HPI She is here to establish with a new pcp.  She is here for follow up.  Hypertension: She is taking her medication daily. She is compliant with a low sodium diet.  She denies chest pain, palpitations, edema, shortness of breath and regular headaches. She is exercising regularly.     Raynaud's phenomenon: She is symptomatic in her hands, feet and it was. She does not have frequent symptoms. Last night she did have symptoms in her nose because it was cold. She was started on amlodipine 2.5 mg daily by her prior primary care physician and this did help. She wonders if there is any other treatment options. She describes herself as being mildly symptomatic.  GERD:  She is taking her medication daily as needed only.  She denies any GERD symptoms and feels her GERD is well controlled.    Medications and allergies reviewed with patient and updated if appropriate.  Patient Active Problem List   Diagnosis Date Noted  . 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 History of skin cancer 02/02/2015  . Acute upper respiratory infection 07/28/2014  . 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 Snoring 01/17/2012  . EUSTACHIAN TUBE DYSFUNCTION, BILATERAL 06/30/2010  . DYSPHAGIA UNSPECIFIED 06/29/2010  . Hyperlipidemia 08/02/2009  . Essential hypertension 08/02/2009  . Osteopenia 08/02/2009  . EPIGASTRIC PAIN, CHRONIC 08/02/2009  . GERD 02/11/1996  . Diverticulosis of large intestine 02/11/1996    Current Outpatient Prescriptions on File Prior to Visit  Medication Sig Dispense Refill  . amLODipine (NORVASC) 2.5 MG tablet TAKE 1 TABLET BY MOUTH EVERY DAY 90 tablet 0  . clidinium-chlordiazePOXIDE (LIBRAX) 5-2.5 MG per capsule TAKE 1 CAPSULE BY MOUTH EVERY 8 HOURS AS NEEDED 60 capsule 2  . dexlansoprazole (DEXILANT) 60 MG capsule Take 1 capsule (60 mg total) by mouth daily. (Patient taking differently: Take 60  mg by mouth daily as needed. ) 30 capsule 2  . fluticasone (FLONASE) 50 MCG/ACT nasal spray PLACE 2 SPRAYS INTO THE NOSE DAILY AS DIRECTED 16 g 3  . losartan (COZAAR) 50 MG tablet Take 1 tablet (50 mg total) by mouth daily. --- Please establish with new PCP for further refills (Patient taking differently: Take 25 mg by mouth daily. ) 30 tablet 5  . Probiotic Product (ALIGN PO) Take by mouth.    . triamterene-hydrochlorothiazide (MAXZIDE-25) 37.5-25 MG per tablet Take 1 tablet by mouth every other day.     . TURMERIC PO Take 500 mg by mouth daily.     No current facility-administered medications on file prior to visit.     Past Medical History:  Diagnosis Date  . Colon polyps   . GERD (gastroesophageal reflux disease)   . Hyperlipidemia   . Osteopenia     Past Surgical History:  Procedure Laterality Date  . COLONOSCOPY W/ POLYPECTOMY    . MOHS SURGERY  2016   melanoma RLE  . UPPER GASTROINTESTINAL ENDOSCOPY  2011   Dr Henrene Pastor  . VESICOVAGINAL FISTULA CLOSURE W/ TAH  1990    Social History   Social History  . Marital status: Married    Spouse name: N/A  . Number of children: N/A  . Years of education: N/A   Social History Main Topics  . Smoking status: Never Smoker  . Smokeless tobacco: Not on file  . Alcohol use 0.0 oz/week     Comment:  10-12 glasses of wine weekly   . Drug use: No  . Sexual activity: Not on file   Other Topics Concern  . Not on file   Social History Narrative  . No narrative on file    Family History  Problem Relation Age of Onset  . Heart disease Father   . Osteoporosis Mother   . Lymphoma Mother     non-hodgkins  . Stroke Neg Hx   . Diabetes Neg Hx   . Colon cancer Neg Hx     Review of Systems  Constitutional: Negative for chills, fatigue and fever.  Respiratory: Negative for cough, shortness of breath and wheezing.   Cardiovascular: Negative for chest pain, palpitations and leg swelling.  Gastrointestinal: Negative for abdominal  pain and nausea.  Neurological: Negative for light-headedness and headaches.       Objective:   Vitals:   04/04/16 1422  BP: 128/76  Pulse: 80  Resp: 16  Temp: 98.2 F (36.8 C)   Filed Weights   04/04/16 1422  Weight: 125 lb (56.7 kg)   Body mass index is 22.86 kg/m.   Physical Exam Constitutional: Appears well-developed and well-nourished. No distress.  HENT:  Head: Normocephalic and atraumatic.  Neck: Neck supple. No tracheal deviation present. No thyromegaly present.  Cardiovascular: Normal rate, regular rhythm and normal heart sounds.   No murmur heard. No carotid bruit  Pulmonary/Chest: Effort normal and breath sounds normal. No respiratory distress. No has no wheezes. No rales.  Musculoskeletal: No edema.  Lymphadenopathy: No cervical adenopathy.  Skin: Skin is warm and dry. Not diaphoretic.  Psychiatric: Normal mood and affect. Behavior is normal.         Assessment & Plan:   Routine blood work today  See Problem List for Assessment and Plan of chronic medical problems.  Follow-up annually

## 2016-04-04 NOTE — Assessment & Plan Note (Signed)
BP well controlled Current regimen effective and well tolerated Continue current medications at current doses cmp  

## 2016-04-06 ENCOUNTER — Other Ambulatory Visit (INDEPENDENT_AMBULATORY_CARE_PROVIDER_SITE_OTHER): Payer: Medicare Other

## 2016-04-06 DIAGNOSIS — I10 Essential (primary) hypertension: Secondary | ICD-10-CM | POA: Diagnosis not present

## 2016-04-06 DIAGNOSIS — K219 Gastro-esophageal reflux disease without esophagitis: Secondary | ICD-10-CM

## 2016-04-06 DIAGNOSIS — I7300 Raynaud's syndrome without gangrene: Secondary | ICD-10-CM

## 2016-04-06 DIAGNOSIS — E785 Hyperlipidemia, unspecified: Secondary | ICD-10-CM

## 2016-04-06 LAB — COMPREHENSIVE METABOLIC PANEL
ALBUMIN: 4 g/dL (ref 3.5–5.2)
ALT: 25 U/L (ref 0–35)
AST: 27 U/L (ref 0–37)
Alkaline Phosphatase: 55 U/L (ref 39–117)
BUN: 15 mg/dL (ref 6–23)
CALCIUM: 9.2 mg/dL (ref 8.4–10.5)
CO2: 32 meq/L (ref 19–32)
CREATININE: 0.95 mg/dL (ref 0.40–1.20)
Chloride: 99 mEq/L (ref 96–112)
GFR: 61.46 mL/min (ref >60.00)
GLUCOSE: 94 mg/dL (ref 70–99)
Potassium: 4.1 mEq/L (ref 3.5–5.1)
Sodium: 136 mEq/L (ref 135–145)
TOTAL PROTEIN: 6.4 g/dL (ref 6.0–8.3)
Total Bilirubin: 0.9 mg/dL (ref 0.2–1.2)

## 2016-04-06 LAB — CBC WITH DIFFERENTIAL/PLATELET
BASOS PCT: 0.5 % (ref 0.0–3.0)
Basophils Absolute: 0 10*3/uL (ref 0.0–0.1)
EOS PCT: 2.7 % (ref 0.0–5.0)
Eosinophils Absolute: 0.2 10*3/uL (ref 0.0–0.7)
HCT: 41.6 % (ref 36.0–46.0)
HEMOGLOBIN: 14.3 g/dL (ref 12.0–15.0)
LYMPHS PCT: 26.3 % (ref 12.0–46.0)
Lymphs Abs: 1.7 10*3/uL (ref 0.7–4.0)
MCHC: 34.5 g/dL (ref 30.0–36.0)
MCV: 92.8 fl (ref 78.0–100.0)
MONO ABS: 0.6 10*3/uL (ref 0.1–1.0)
Monocytes Relative: 8.4 % (ref 3.0–12.0)
Neutro Abs: 4.1 10*3/uL (ref 1.4–7.7)
Neutrophils Relative %: 62.1 % (ref 43.0–77.0)
Platelets: 250 10*3/uL (ref 150.0–400.0)
RBC: 4.49 Mil/uL (ref 3.87–5.11)
RDW: 13.1 % (ref 11.5–15.5)
WBC: 6.6 10*3/uL (ref 4.0–10.5)

## 2016-04-06 LAB — LIPID PANEL
CHOL/HDL RATIO: 2
Cholesterol: 189 mg/dL (ref 0–200)
HDL: 77.9 mg/dL (ref >39.00)
LDL Cholesterol: 91 mg/dL (ref 0–99)
NONHDL: 111.26
Triglycerides: 102 mg/dL (ref 0.0–149.0)
VLDL: 20.4 mg/dL (ref 0.0–40.0)

## 2016-04-06 LAB — TSH: TSH: 1.68 u[IU]/mL (ref 0.35–4.50)

## 2016-04-08 ENCOUNTER — Encounter: Payer: Self-pay | Admitting: Internal Medicine

## 2016-04-17 ENCOUNTER — Other Ambulatory Visit: Payer: Self-pay | Admitting: Internal Medicine

## 2016-05-10 IMAGING — MG MM DIAG BREAST TOMO UNI LEFT
4 series · 4 of 12 positions shown · non-contrast
Comparison: 12/30/2014, 12/24/2013, 12/09/2012, 01/03/2012,
11/10/2010

CLINICAL DATA: Possible mass left breast identified on one view of
the recent screening tomogram.

EXAM:
DIGITAL DIAGNOSTIC LEFT MAMMOGRAM WITH 3D TOMOSYNTHESIS AND CAD

[L ML]
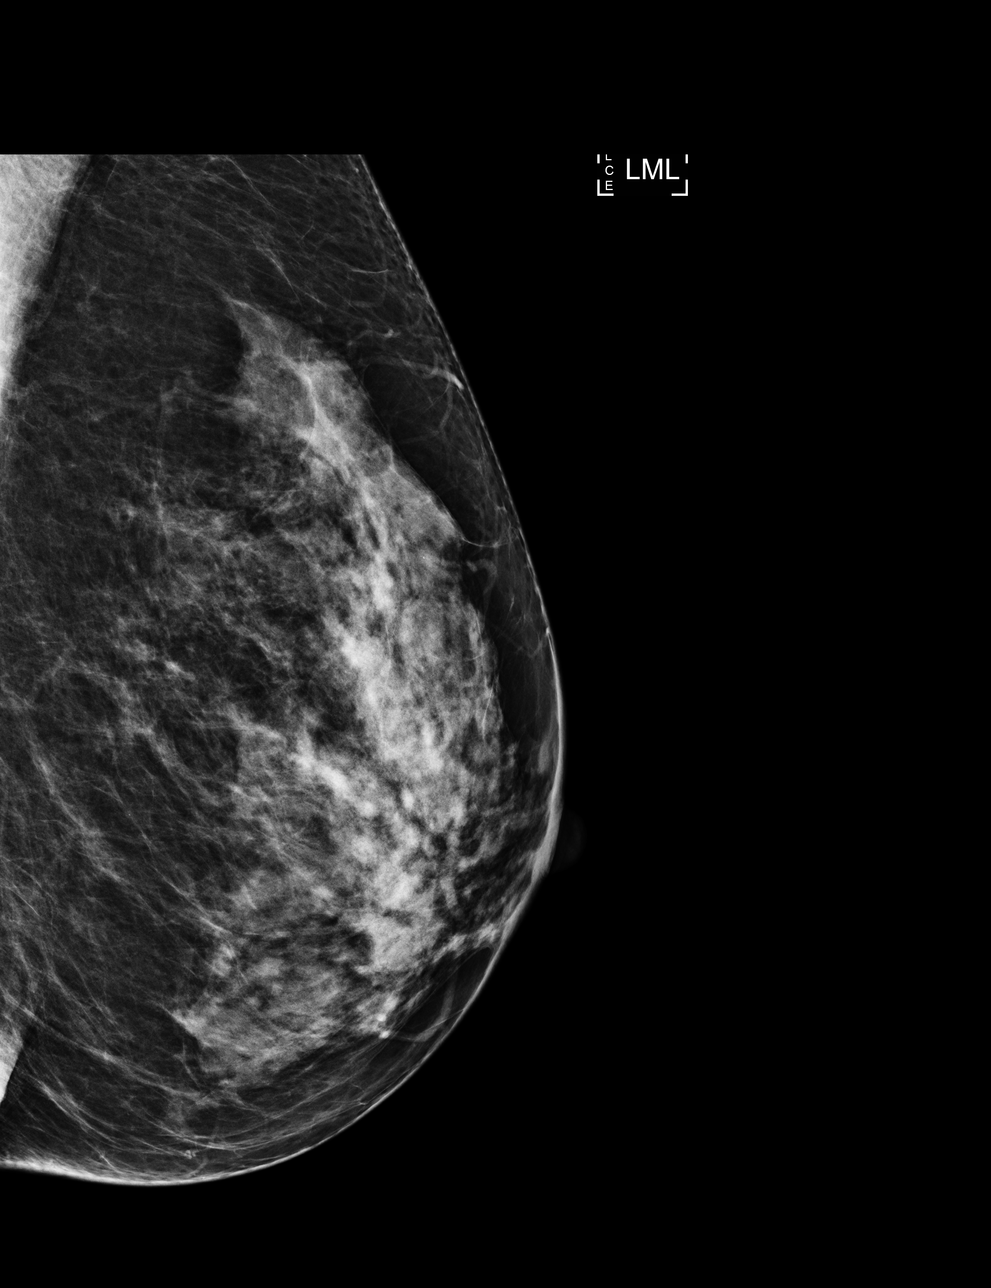

[L CC]
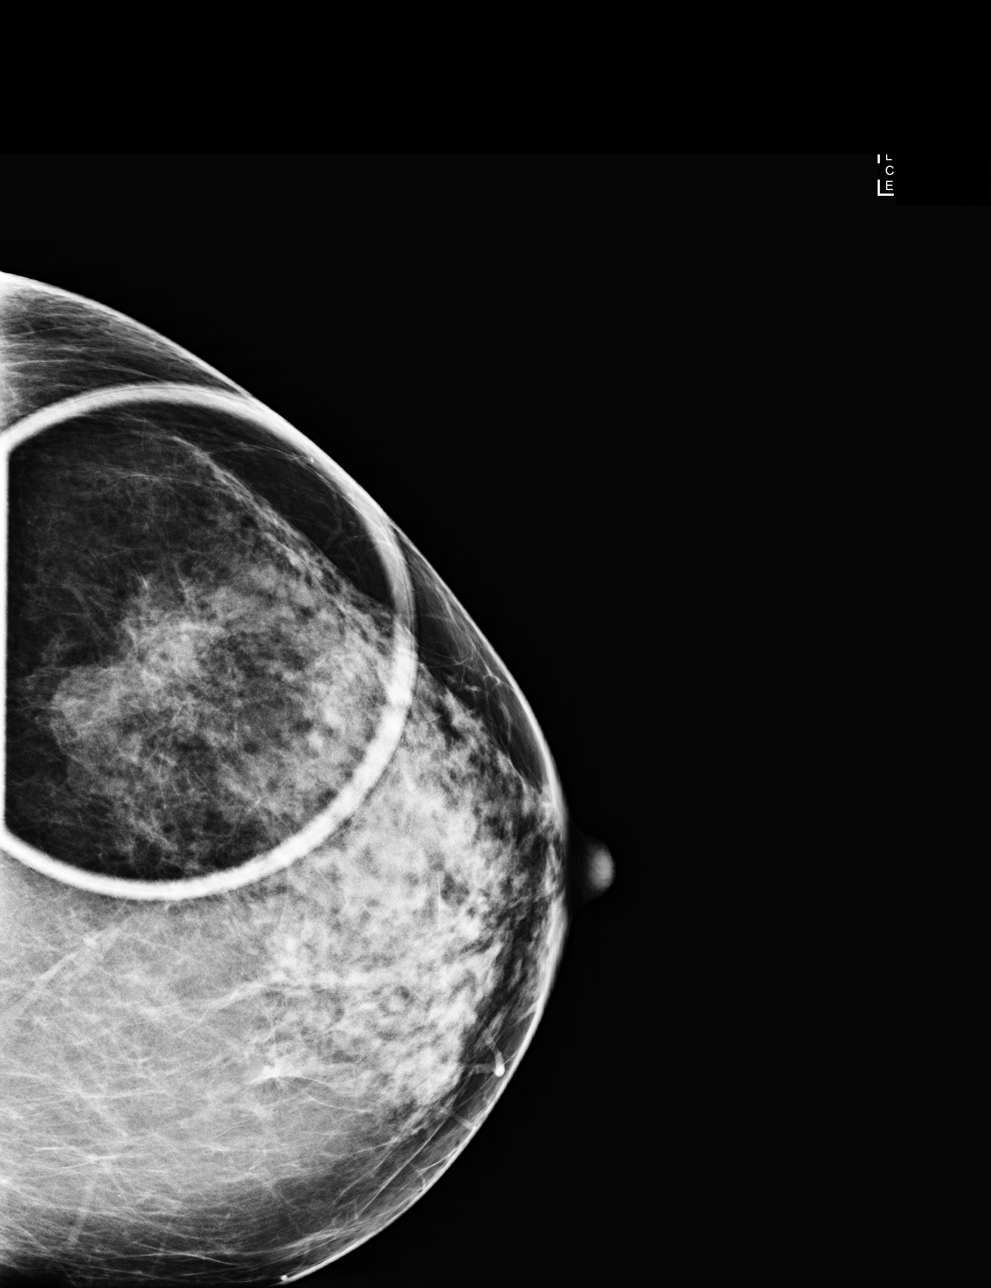

[L ML tomo · tomo slice 35/68.0]
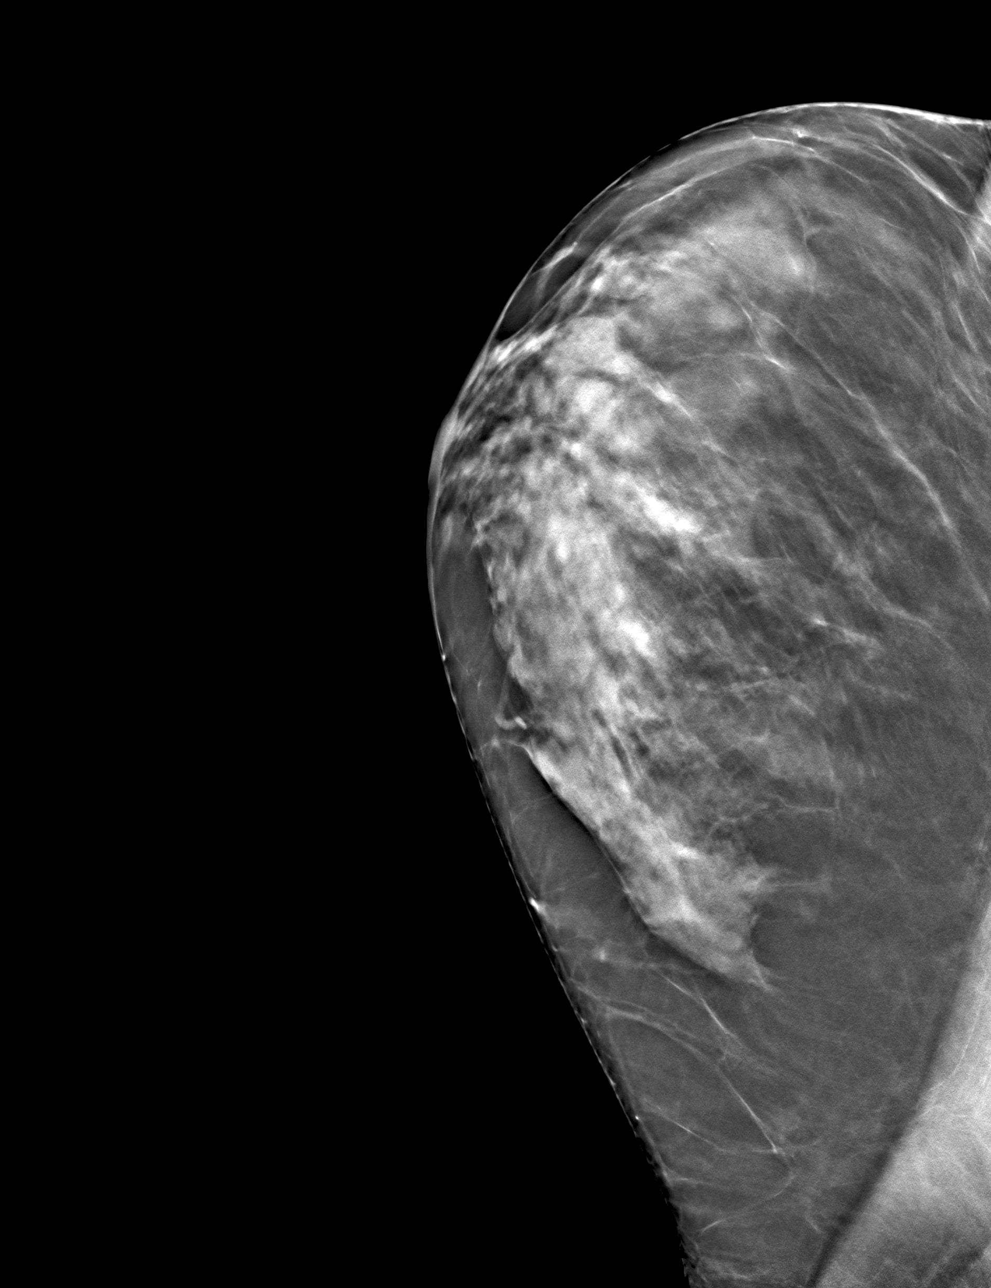

[L CC tomo · tomo slice 30/59.0]
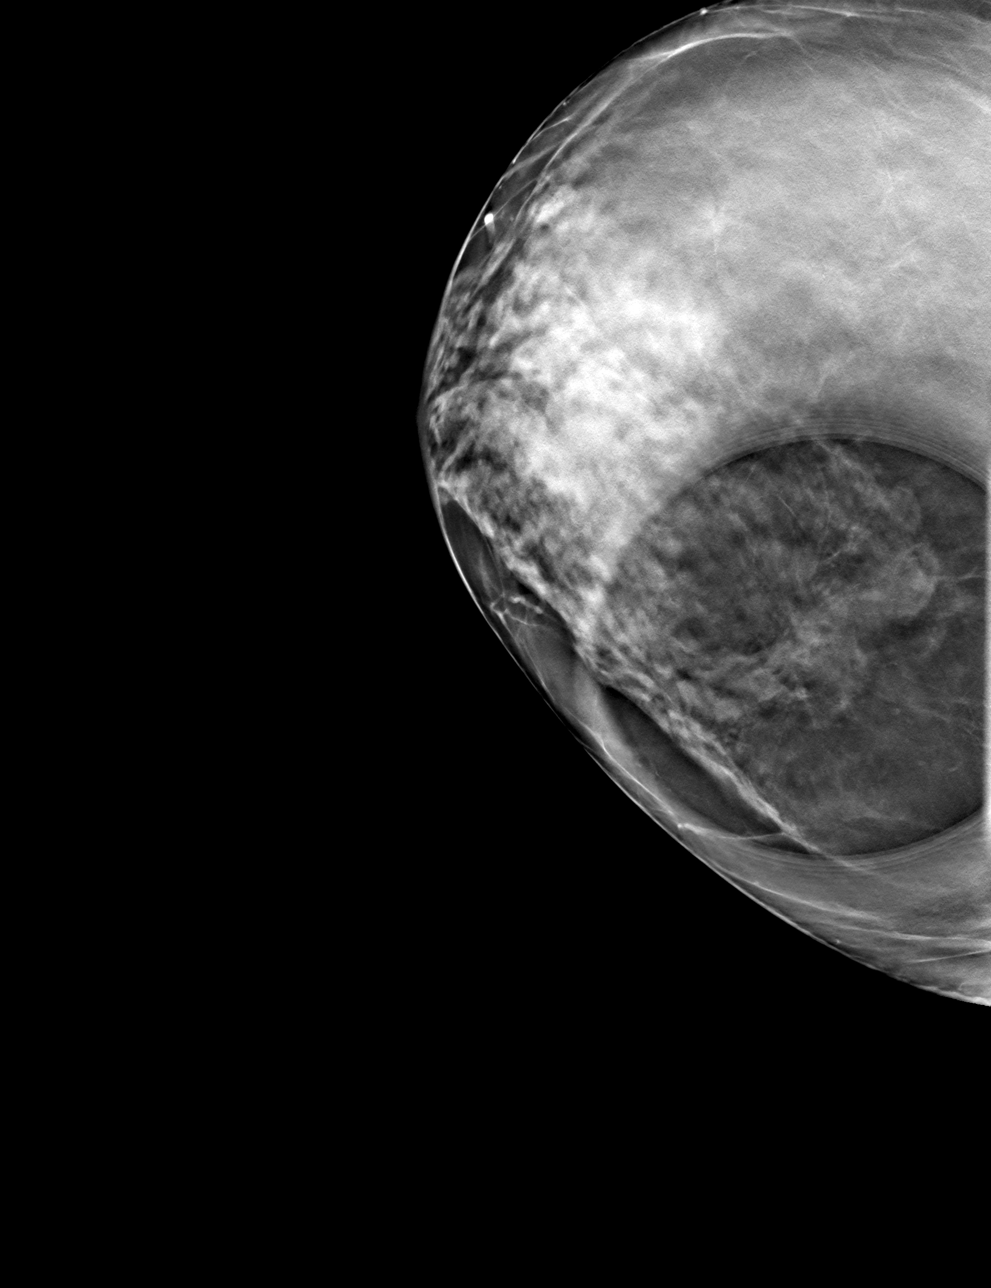

[4 of 12 positions shown; findings below may reference images not displayed]

ACR Breast Density Category c: The breast tissue is heterogeneously
dense, which may obscure small masses.
FINDINGS: Focal spot compression tomographic views of the outer left breast
show dispersion of normal appearing fibroglandular tissue. This
island of tissue appears similar to prior mammograms. A 90 degrees
lateral tomogram of the left breast is negative. No mass or
distortion is seen to suggest malignancy.

Mammographic images were processed with CAD.
IMPRESSION: No evidence of malignancy in the left breast.

RECOMMENDATION:
Screening mammogram in one year.(Code:B7-O-I54)

I have discussed the findings and recommendations with the patient.
Results were also provided in writing at the conclusion of the
visit. If applicable, a reminder letter will be sent to the patient
regarding the next appointment.

BI-RADS CATEGORY  1: Negative.

## 2016-05-25 ENCOUNTER — Encounter: Payer: Self-pay | Admitting: Internal Medicine

## 2016-06-06 ENCOUNTER — Ambulatory Visit (INDEPENDENT_AMBULATORY_CARE_PROVIDER_SITE_OTHER): Payer: Medicare Other

## 2016-06-06 DIAGNOSIS — Z23 Encounter for immunization: Secondary | ICD-10-CM

## 2016-06-15 ENCOUNTER — Encounter: Payer: Self-pay | Admitting: Internal Medicine

## 2016-06-18 MED ORDER — AMLODIPINE BESYLATE 5 MG PO TABS: 5.0000 mg | ORAL_TABLET | Freq: Every day | ORAL | 3 refills | Status: DC

## 2016-08-08 ENCOUNTER — Other Ambulatory Visit: Payer: Self-pay | Admitting: Internal Medicine

## 2016-08-10 ENCOUNTER — Ambulatory Visit: Payer: Medicare Other | Admitting: Family

## 2016-08-15 ENCOUNTER — Encounter: Payer: Self-pay | Admitting: Internal Medicine

## 2016-08-15 ENCOUNTER — Ambulatory Visit (INDEPENDENT_AMBULATORY_CARE_PROVIDER_SITE_OTHER): Payer: Medicare Other | Admitting: Internal Medicine

## 2016-08-15 VITALS — BP 126/74 | HR 70 | Temp 98.8°F | Resp 20 | Wt 127.0 lb

## 2016-08-15 DIAGNOSIS — R05 Cough: Secondary | ICD-10-CM | POA: Diagnosis not present

## 2016-08-15 DIAGNOSIS — R062 Wheezing: Secondary | ICD-10-CM | POA: Insufficient documentation

## 2016-08-15 DIAGNOSIS — I10 Essential (primary) hypertension: Secondary | ICD-10-CM | POA: Diagnosis not present

## 2016-08-15 DIAGNOSIS — H8102 Meniere's disease, left ear: Secondary | ICD-10-CM | POA: Diagnosis not present

## 2016-08-15 DIAGNOSIS — R059 Cough, unspecified: Secondary | ICD-10-CM | POA: Insufficient documentation

## 2016-08-15 MED ORDER — BENZONATATE 200 MG PO CAPS: 200.0000 mg | ORAL_CAPSULE | Freq: Three times a day (TID) | ORAL | 1 refills | Status: DC | PRN

## 2016-08-15 MED ORDER — DOXYCYCLINE HYCLATE 100 MG PO TABS: 100.0000 mg | ORAL_TABLET | Freq: Two times a day (BID) | ORAL | 0 refills | Status: DC

## 2016-08-15 MED ORDER — TRIAMCINOLONE ACETONIDE 55 MCG/ACT NA AERO: 2.0000 | INHALATION_SPRAY | Freq: Every day | NASAL | 12 refills | Status: DC

## 2016-08-15 NOTE — Progress Notes (Signed)
Subjective:    Patient ID: Ruth Jones, female    DOB: [birth year], 70 y.o.   MRN: [id number]  HPI  Here with acute onset mild to mod 2-3 days ST, HA, general weakness and malaise, with prod cough greenish sputum, but Pt denies chest pain, increased sob or doe, orthopnea, PND, increased LE swelling, palpitations, dizziness or syncope, but has felt some wheezing and very mild sob with lying down last night.  Currently taking the flonase but not all the time for her menieres due to recurring nosebleeds. Pt denies new neurological symptoms such as new headache, or facial or extremity weakness or numbness   Pt denies polydipsia, polyuria,  Past Medical History:  Diagnosis Date  . Colon polyps   . GERD (gastroesophageal reflux disease)   . Hyperlipidemia   . Osteopenia    Past Surgical History:  Procedure Laterality Date  . COLONOSCOPY W/ POLYPECTOMY    . MOHS SURGERY  2016   melanoma RLE  . UPPER GASTROINTESTINAL ENDOSCOPY  2011   Dr Henrene Pastor  . VESICOVAGINAL FISTULA CLOSURE W/ TAH  1990    reports that she has never smoked. She does not have any smokeless tobacco history on file. She reports that she drinks alcohol. She reports that she does not use drugs. family history includes Heart disease in her father; Lymphoma in her mother; Osteoporosis in her mother. Allergies  Allergen Reactions  . Levofloxacin     REACTION: GI Intolerance as "horrible stomach pain"  . Metoprolol Succinate     REACTION: Tachycardia  . Penicillins     REACTION: yeast  . Sulfonamide Derivatives     REACTION: itching   Current Outpatient Prescriptions on File Prior to Visit  Medication Sig Dispense Refill  . amLODipine (NORVASC) 5 MG tablet Take 1 tablet (5 mg total) by mouth daily. 90 tablet 3  . clidinium-chlordiazePOXIDE (LIBRAX) 5-2.5 MG per capsule TAKE 1 CAPSULE BY MOUTH EVERY 8 HOURS AS NEEDED 60 capsule 2  . dexlansoprazole (DEXILANT) 60 MG capsule Take 1 capsule (60 mg total) by mouth daily.  (Patient taking differently: Take 60 mg by mouth daily as needed. ) 30 capsule 2  . estradiol (ESTRACE) 0.5 MG tablet Take 0.5 mg by mouth daily.    Marland Kitchen losartan (COZAAR) 50 MG tablet Take 1 tablet (50 mg total) by mouth daily. --- Please establish with new PCP for further refills (Patient taking differently: Take 25 mg by mouth daily. ) 30 tablet 5  . Probiotic Product (ALIGN PO) Take by mouth.    . triamterene-hydrochlorothiazide (MAXZIDE-25) 37.5-25 MG per tablet Take 1 tablet by mouth every other day.     . TURMERIC PO Take 500 mg by mouth daily.     No current facility-administered medications on file prior to visit.    Review of Systems  Constitutional: Negative for unusual diaphoresis or night sweats HENT: Negative for ear swelling or discharge Eyes: Negative for worsening visual haziness  Respiratory: Negative for choking and stridor.   Gastrointestinal: Negative for distension or worsening eructation Genitourinary: Negative for retention or change in urine volume.  Musculoskeletal: Negative for other MSK pain or swelling Skin: Negative for color change and worsening wound Neurological: Negative for tremors and numbness other than noted  Psychiatric/Behavioral: Negative for decreased concentration or agitation other than above   All other system neg per pt    Objective:   Physical Exam BP 126/74   Pulse 70   Temp 98.8 F (37.1 C) (Oral)  Resp 20   Wt 127 lb (57.6 kg)   SpO2 98%   BMI 23.23 kg/m  VS noted,  Constitutional: Pt appears in no apparent distress HENT: Head: NCAT.  Right Ear: External ear normal.  Left Ear: External ear normal.  Eyes: . Pupils are equal, round, and reactive to light. Conjunctivae and EOM are normal Bilat tm's with mild erythema.  Max sinus areas non tender.  Pharynx with mild erythema, no exudate Neck: Normal range of motion. Neck supple.  Cardiovascular: Normal rate and regular rhythm.   Pulmonary/Chest: Effort normal and breath sounds  somewhat decresaed bilat without rales or wheezing.  Neurological: Pt is alert. Not confused , motor grossly intact Skin: Skin is warm. No rash, no LE edema Psychiatric: Pt behavior is normal. No agitation.  No other new exam findings    Assessment & Plan:

## 2016-08-15 NOTE — Assessment & Plan Note (Signed)
Mild to mod, c/w bronchitis vs pna, declines cxr, for antibx course,  Tessalon perle prn, to f/u any worsening symptoms or concerns

## 2016-08-15 NOTE — Patient Instructions (Signed)
Please take all new medication as prescribed - the antibiotic, cough pills, and the Nasaocort AQ  Ok to stop the fluticasone nasal spray  Please continue all other medications as before, and refills have been done if requested.  Please have the pharmacy call with any other refills you may need.  Please keep your appointments with your specialists as you may have planned

## 2016-08-15 NOTE — Assessment & Plan Note (Signed)
Unfortunately with nosebleeds with the flonase which is ETOH based, ok to change to nasacort aq/water based to avoid further nosebleed

## 2016-08-15 NOTE — Assessment & Plan Note (Signed)
stable overall by history and exam, recent data reviewed with pt, and pt to continue medical treatment as before,  to f/u any worsening symptoms or concerns BP Readings from Last 3 Encounters:  08/15/16 126/74  04/04/16 128/76  08/20/15 120/80

## 2016-08-15 NOTE — Assessment & Plan Note (Signed)
None by exam, I think can hold on steroid tx at this time,  to f/u any worsening symptoms or concerns

## 2016-08-15 NOTE — Progress Notes (Signed)
Pre visit review using our clinic review tool, if applicable. No additional management support is needed unless otherwise documented below in the visit note. 

## 2016-10-01 ENCOUNTER — Encounter: Payer: Self-pay | Admitting: Internal Medicine

## 2016-10-02 ENCOUNTER — Other Ambulatory Visit: Payer: Self-pay | Admitting: Internal Medicine

## 2016-10-02 DIAGNOSIS — K219 Gastro-esophageal reflux disease without esophagitis: Secondary | ICD-10-CM

## 2016-10-22 ENCOUNTER — Other Ambulatory Visit: Payer: Self-pay | Admitting: Internal Medicine

## 2016-11-02 ENCOUNTER — Encounter: Payer: Self-pay | Admitting: Internal Medicine

## 2016-11-02 ENCOUNTER — Other Ambulatory Visit: Payer: Self-pay | Admitting: Internal Medicine

## 2016-11-03 NOTE — Telephone Encounter (Signed)
Left message advising patient that rx has been sent, patient will need to schedule office visit if no better after med is completed

## 2016-11-03 NOTE — Telephone Encounter (Signed)
ok to refill once  - if not better needs to be seen

## 2016-11-03 NOTE — Telephone Encounter (Signed)
Requested Prescriptions   Pending Prescriptions Disp Refills  . doxycycline (VIBRA-TABS) 100 MG tablet [Pharmacy Med Name: DOXYCYCLINE HYCLATE 100MG  TABLETS] 20 tablet 0    Sig: TAKE 1 TABLET(100 MG) BY MOUTH TWICE DAILY    Last OV 07/2016  Is this refill appropriate or would this patient need an appt?

## 2016-11-03 NOTE — Telephone Encounter (Signed)
Routing to dr burns, please advise, thanks 

## 2016-11-09 ENCOUNTER — Other Ambulatory Visit: Payer: Self-pay

## 2016-11-09 NOTE — Telephone Encounter (Signed)
RX has been sent.

## 2017-01-11 ENCOUNTER — Encounter: Payer: Self-pay | Admitting: Internal Medicine

## 2017-04-05 ENCOUNTER — Encounter: Payer: Self-pay | Admitting: Internal Medicine

## 2017-04-05 NOTE — Progress Notes (Signed)
Subjective:    Patient ID: Ruth Jones, female    DOB: 05/24/1946, 71 y.o.   MRN: [id number]  HPI Here for medicare wellness exam and annual physical exam..   I have personally reviewed and have noted 1.The patient's medical and social history 2.Their use of alcohol, tobacco or illicit drugs 3.Their current medications and supplements 4.The patient's functional ability including ADL's, fall risks, home                 safety risk and hearing or visual impairment. 5.Diet and physical activities 6.Evidence for depression or mood disorders 7.Care team reviewed  - ENT - Dr Thornell Mule, Eye - DR Bertram Millard - Dr Stann Mainland, [facility] Mccallum - Dr Wilhemina Bonito    Are there smokers in your home (other than you)? No  Risk Factors Exercise:  Walks 5  Days/week - 30-50 minutes, yoga Dietary issues discussed: well balanced, eats very healthy  Cardiac risk factors: advanced age, hypertension  Depression Screen  Have you felt down, depressed or hopeless? No  Have you felt little interest or pleasure in doing things?  No  Activities of Daily Living In your present state of health, do you have any difficulty performing the following activities?:  Driving? No Managing money?  No Feeding yourself? No Getting from bed to chair? No Climbing a flight of stairs? No Preparing food and eating?: No Bathing or showering? No Getting dressed: No Getting to/using the toilet? No Moving around from place to place: No In the past year have you fallen or had a near fall?: No   Are you sexually active?  No  Do you have more than one partner?  N/A  Hearing Difficulties:  Do you often ask people to speak up or repeat themselves? Yes - left ear, sees ENT Do you experience ringing or noises in your ears? No Do you have difficulty understanding soft or whispered voices? yes Vision:              Any change in vision:  No              Up to date with eye  exam:   Up to date   Memory:  Do you feel that you have a problem with memory? No, except name recall  Do you often misplace items? No  Do you feel safe at home?  Yes  Cognitive Testing  Alert, Orientated? Yes  Normal Appearance? Yes  Recall of three objects?  Yes  Can perform simple calculations? Yes  Displays appropriate judgment? Yes  Can read the correct time from a watch face? Yes   Advanced Directives have been discussed with the patient? Yes   Medications and allergies reviewed with patient and updated if appropriate.  Patient Active Problem List   Diagnosis Date Noted  . Raynaud's phenomenon 04/04/2016  . 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 History of skin cancer 02/02/2015  . 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 Snoring 01/17/2012  . Hyperlipidemia 08/02/2009  . Essential hypertension 08/02/2009  . Osteopenia 08/02/2009  . GERD 02/11/1996  . Diverticulosis of large intestine 02/11/1996    Current Outpatient Prescriptions on File Prior to Visit  Medication Sig Dispense Refill  . amLODipine (NORVASC) 5 MG tablet Take 1 tablet (5 mg total) by mouth daily. 90 tablet 3  . clidinium-chlordiazePOXIDE (LIBRAX) 5-2.5 MG per capsule TAKE 1 CAPSULE BY MOUTH EVERY 8 HOURS AS NEEDED 60 capsule 2  . DEXILANT 60 MG capsule TAKE 1 CAPSULE  BY MOUTH ONCE DAILY 30 capsule 3  . estradiol (ESTRACE) 0.5 MG tablet Take 0.5 mg by mouth daily.    . Probiotic Product (ALIGN PO) Take by mouth.    . triamcinolone (NASACORT AQ) 55 MCG/ACT AERO nasal inhaler Place 2 sprays into the nose daily. 1 Inhaler 12  . triamterene-hydrochlorothiazide (MAXZIDE-25) 37.5-25 MG per tablet Take 1 tablet by mouth every other day.     . TURMERIC PO Take 500 mg by mouth daily.     No current facility-administered medications on file prior to visit.     Past Medical History:  Diagnosis Date  . Colon polyps   . GERD (gastroesophageal reflux disease)   . Hyperlipidemia   . Osteopenia     Past Surgical  History:  Procedure Laterality Date  . COLONOSCOPY W/ POLYPECTOMY    . MOHS SURGERY  2016   melanoma RLE  . UPPER GASTROINTESTINAL ENDOSCOPY  2011   Dr Henrene Pastor  . VESICOVAGINAL FISTULA CLOSURE W/ TAH  1990    Social History   Social History  . Marital status: Married    Spouse name: N/A  . Number of children: N/A  . Years of education: N/A   Social History Main Topics  . Smoking status: Never Smoker  . Smokeless tobacco: Never Used  . Alcohol use 0.0 oz/week     Comment:  10-12 glasses of wine weekly   . Drug use: No  . Sexual activity: Not Asked   Other Topics Concern  . None   Social History Narrative  . None    Family History  Problem Relation Age of Onset  . Heart disease Father   . Osteoporosis Mother   . Lymphoma Mother        non-hodgkins  . Stroke Neg Hx   . Diabetes Neg Hx   . Colon cancer Neg Hx     Review of Systems  Constitutional: Negative for chills and fever.  HENT: Positive for hearing loss. Negative for tinnitus.   Eyes: Negative for visual disturbance.  Respiratory: Negative for shortness of breath and wheezing.   Cardiovascular: Positive for leg swelling. Negative for chest pain and palpitations.  Gastrointestinal: Negative for abdominal pain, blood in stool, constipation, diarrhea and nausea.  Genitourinary: Negative for dysuria and hematuria.  Musculoskeletal: Positive for arthralgias (hands).  Skin: Negative for rash.  Neurological: Negative for light-headedness and headaches.  Psychiatric/Behavioral: Negative for dysphoric mood. The patient is not nervous/anxious.        Objective:   Vitals:   04/06/17 0915  BP: 110/76  Pulse: 63  Temp: 97.9 F (36.6 C)  SpO2: 99%   Filed Weights   04/06/17 0915  Weight: 126 lb (57.2 kg)   Body mass index is 23.05 kg/m.  Wt Readings from Last 3 Encounters:  04/06/17 126 lb (57.2 kg)  08/15/16 127 lb (57.6 kg)  04/04/16 125 lb (56.7 kg)     Physical Exam Constitutional: She appears  well-developed and well-nourished. No distress.  HENT:  Head: Normocephalic and atraumatic.  Right Ear: External ear normal. Normal ear canal and TM Left Ear: External ear normal.  Normal ear canal and TM Mouth/Throat: Oropharynx is clear and moist.  Eyes: Conjunctivae and EOM are normal.  Neck: Neck supple. No tracheal deviation present. No thyromegaly present.  No carotid bruit  Cardiovascular: Normal rate, regular rhythm and normal heart sounds.   No murmur heard.  No edema.  varicose veins b/l ankles Pulmonary/Chest: Effort normal and breath sounds normal.  No respiratory distress. She has no wheezes. She has no rales.  Breast: deferred to Gyn Abdominal: Soft. She exhibits no distension. There is no tenderness.  Lymphadenopathy: She has no cervical adenopathy.  Skin: Skin is warm and dry. She is not diaphoretic.  Psychiatric: She has a normal mood and affect. Her behavior is normal.         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  Physical exam: Screening blood work ordered Immunizations  Up to date, discussed shingrix - had the first vaccine Colonoscopy  Up to date  Mammogram  Up to date  56 - annually, Dr Stann Mainland Dexa  dexa done 2016 - osteopenia,  Will repeat this year Eye exams  Up to date  EKG  Last done 2013 - EKG today shows sinus brady at 59 bpm, low voltage, no change compared to EKG from 2013, normal EKG Exercise  Regular - will continue Weight  Normal BMI Skin  - sees derm annually, no  concerns Substance abuse  None - advised not to drink more than one drink a night and ideally not nightly  See Problem List for Assessment and Plan of chronic medical problems.   FU annually

## 2017-04-06 ENCOUNTER — Ambulatory Visit (INDEPENDENT_AMBULATORY_CARE_PROVIDER_SITE_OTHER): Payer: Medicare PPO | Admitting: Internal Medicine

## 2017-04-06 ENCOUNTER — Encounter: Payer: Self-pay | Admitting: Internal Medicine

## 2017-04-06 VITALS — BP 110/76 | HR 63 | Temp 97.9°F | Ht 62.0 in | Wt 126.0 lb

## 2017-04-06 DIAGNOSIS — M8580 Other specified disorders of bone density and structure, unspecified site: Secondary | ICD-10-CM | POA: Diagnosis not present

## 2017-04-06 DIAGNOSIS — I7300 Raynaud's syndrome without gangrene: Secondary | ICD-10-CM

## 2017-04-06 DIAGNOSIS — I10 Essential (primary) hypertension: Secondary | ICD-10-CM

## 2017-04-06 DIAGNOSIS — H8102 Meniere's disease, left ear: Secondary | ICD-10-CM | POA: Diagnosis not present

## 2017-04-06 DIAGNOSIS — K219 Gastro-esophageal reflux disease without esophagitis: Secondary | ICD-10-CM | POA: Diagnosis not present

## 2017-04-06 DIAGNOSIS — Z85828 Personal history of other malignant neoplasm of skin: Secondary | ICD-10-CM | POA: Diagnosis not present

## 2017-04-06 DIAGNOSIS — Z13820 Encounter for screening for osteoporosis: Secondary | ICD-10-CM | POA: Diagnosis not present

## 2017-04-06 DIAGNOSIS — E7800 Pure hypercholesterolemia, unspecified: Secondary | ICD-10-CM

## 2017-04-06 DIAGNOSIS — Z0000 Encounter for general adult medical examination without abnormal findings: Secondary | ICD-10-CM

## 2017-04-06 MED ORDER — LOSARTAN POTASSIUM 25 MG PO TABS
25.0000 mg | ORAL_TABLET | Freq: Every day | ORAL | 3 refills | Status: DC
Start: 2017-04-06 — End: 2018-05-19

## 2017-04-06 MED ORDER — FAMOTIDINE-CA CARB-MAG HYDROX 10-800-165 MG PO CHEW: 1.0000 | CHEWABLE_TABLET | Freq: Every day | ORAL | Status: AC | PRN

## 2017-04-06 MED ORDER — AMLODIPINE BESYLATE 5 MG PO TABS: 5.0000 mg | ORAL_TABLET | Freq: Every day | ORAL | 3 refills | Status: DC

## 2017-04-06 NOTE — Assessment & Plan Note (Signed)
Sees derm annually 

## 2017-04-06 NOTE — Assessment & Plan Note (Addendum)
Frequent GERD Takes pepcid complete a couple of times a week, takes dexilant for 2-3 days only when needed

## 2017-04-06 NOTE — Assessment & Plan Note (Signed)
dexa ordered  Continue regular exercise continue calcium/ vitamin d

## 2017-04-06 NOTE — Assessment & Plan Note (Signed)
Check lipids 

## 2017-04-06 NOTE — Assessment & Plan Note (Addendum)
Following with Dr Thornell Mule Taking maxzide QOD controlled

## 2017-04-06 NOTE — Assessment & Plan Note (Signed)
BP well controlled Current regimen effective and well tolerated Continue current medications at current doses  

## 2017-04-06 NOTE — Assessment & Plan Note (Signed)
Taking amlodipine daily

## 2017-04-06 NOTE — Patient Instructions (Signed)
Ms. Nachtigal , Thank you for taking time to come for your Medicare Wellness Visit. I appreciate your ongoing commitment to your health goals. Please review the following plan we discussed and let me know if I can assist you in the future.   These are the goals we discussed: Goals    None      This is a list of the screening recommended for you and due dates:  Health Maintenance  Topic Date Due  . Flu Shot  03/21/2017  . DEXA scan (bone density measurement)  02/28/2018  . Mammogram  04/11/2018  . Colon Cancer Screening  07/19/2020  . Tetanus Vaccine  02/01/2025  .  Hepatitis C: One time screening is recommended by Center for Disease Control  (CDC) for  adults born from [birth year] through 1967.   Completed  . Pneumonia vaccines  Completed     Test(s) ordered today. Your results will be released to [facility] (or called to you) after review, usually within 72hours after test completion. If any changes need to be made, you will be notified at that same time.  All other Health Maintenance issues reviewed.   All recommended immunizations and age-appropriate screenings are up-to-date or discussed.  No immunizations administered today.   Medications reviewed and updated.  No changes recommended at this time.  Your prescription(s) have been submitted to your pharmacy. Please take as directed and contact our office if you believe you are having problem(s) with the medication(s).  Please followup in one year   Health Maintenance, Female Adopting a healthy lifestyle and getting preventive care can go a long way to promote health and wellness. Talk with your health care provider about what schedule of regular examinations is right for you. This is a good chance for you to check in with your provider about disease prevention and staying healthy. In between checkups, there are plenty of things you can do on your own. Experts have done a lot of research about which lifestyle changes and preventive  measures are most likely to keep you healthy. Ask your health care provider for more information. Weight and diet Eat a healthy diet  Be sure to include plenty of vegetables, fruits, low-fat dairy products, and lean protein.  Do not eat a lot of foods high in solid fats, added sugars, or salt.  Get regular exercise. This is one of the most important things you can do for your health. ? Most adults should exercise for at least 150 minutes each week. The exercise should increase your heart rate and make you sweat (moderate-intensity exercise). ? Most adults should also do strengthening exercises at least twice a week. This is in addition to the moderate-intensity exercise.  Maintain a healthy weight  Body mass index (BMI) is a measurement that can be used to identify possible weight problems. It estimates body fat based on height and weight. Your health care provider can help determine your BMI and help you achieve or maintain a healthy weight.  For females 38 years of age and older: ? A BMI below 18.5 is considered underweight. ? A BMI of 18.5 to 24.9 is normal. ? A BMI of 25 to 29.9 is considered overweight. ? A BMI of 30 and above is considered obese.  Watch levels of cholesterol and blood lipids  You should start having your blood tested for lipids and cholesterol at 73 years of age, then have this test every 5 years.  You may need to have your cholesterol levels checked  more often if: ? Your lipid or cholesterol levels are high. ? You are older than 73 years of age. ? You are at high risk for heart disease.  Cancer screening Lung Cancer  Lung cancer screening is recommended for adults 80-19 years old who are at high risk for lung cancer because of a history of smoking.  A yearly low-dose CT scan of the lungs is recommended for people who: ? Currently smoke. ? Have quit within the past 15 years. ? Have at least a 30-pack-year history of smoking. A pack year is smoking an  average of one pack of cigarettes a day for 1 year.  Yearly screening should continue until it has been 15 years since you quit.  Yearly screening should stop if you develop a health problem that would prevent you from having lung cancer treatment.  Breast Cancer  Practice breast self-awareness. This means understanding how your breasts normally appear and feel.  It also means doing regular breast self-exams. Let your health care provider know about any changes, no matter how small.  If you are in your 20s or 30s, you should have a clinical breast exam (CBE) by a health care provider every 1-3 years as part of a regular health exam.  If you are 51 or older, have a CBE every year. Also consider having a breast X-ray (mammogram) every year.  If you have a family history of breast cancer, talk to your health care provider about genetic screening.  If you are at high risk for breast cancer, talk to your health care provider about having an MRI and a mammogram every year.  Breast cancer gene (BRCA) assessment is recommended for women who have family members with BRCA-related cancers. BRCA-related cancers include: ? Breast. ? Ovarian. ? Tubal. ? Peritoneal cancers.  Results of the assessment will determine the need for genetic counseling and BRCA1 and BRCA2 testing.  Cervical Cancer Your health care provider may recommend that you be screened regularly for cancer of the pelvic organs (ovaries, uterus, and vagina). This screening involves a pelvic examination, including checking for microscopic changes to the surface of your cervix (Pap test). You may be encouraged to have this screening done every 3 years, beginning at age 71.  For women ages 51-65, health care providers may recommend pelvic exams and Pap testing every 3 years, or they may recommend the Pap and pelvic exam, combined with testing for human papilloma virus (HPV), every 5 years. Some types of HPV increase your risk of cervical  cancer. Testing for HPV may also be done on women of any age with unclear Pap test results.  Other health care providers may not recommend any screening for nonpregnant women who are considered low risk for pelvic cancer and who do not have symptoms. Ask your health care provider if a screening pelvic exam is right for you.  If you have had past treatment for cervical cancer or a condition that could lead to cancer, you need Pap tests and screening for cancer for at least 20 years after your treatment. If Pap tests have been discontinued, your risk factors (such as having a new sexual partner) need to be reassessed to determine if screening should resume. Some women have medical problems that increase the chance of getting cervical cancer. In these cases, your health care provider may recommend more frequent screening and Pap tests.  Colorectal Cancer  This type of cancer can be detected and often prevented.  Routine colorectal cancer screening usually  begins at 73 years of age and continues through 73 years of age.  Your health care provider may recommend screening at an earlier age if you have risk factors for colon cancer.  Your health care provider may also recommend using home test kits to check for hidden blood in the stool.  A small camera at the end of a tube can be used to examine your colon directly (sigmoidoscopy or colonoscopy). This is done to check for the earliest forms of colorectal cancer.  Routine screening usually begins at age 9.  Direct examination of the colon should be repeated every 5-10 years through 73 years of age. However, you may need to be screened more often if early forms of precancerous polyps or small growths are found.  Skin Cancer  Check your skin from head to toe regularly.  Tell your health care provider about any new moles or changes in moles, especially if there is a change in a mole's shape or color.  Also tell your health care provider if you  have a mole that is larger than the size of a pencil eraser.  Always use sunscreen. Apply sunscreen liberally and repeatedly throughout the day.  Protect yourself by wearing long sleeves, pants, a wide-brimmed hat, and sunglasses whenever you are outside.  Heart disease, diabetes, and high blood pressure  High blood pressure causes heart disease and increases the risk of stroke. High blood pressure is more likely to develop in: ? People who have blood pressure in the high end of the normal range (130-139/85-89 mm Hg). ? People who are overweight or obese. ? People who are African American.  If you are 54-13 years of age, have your blood pressure checked every 3-5 years. If you are 48 years of age or older, have your blood pressure checked every year. You should have your blood pressure measured twice-once when you are at a hospital or clinic, and once when you are not at a hospital or clinic. Record the average of the two measurements. To check your blood pressure when you are not at a hospital or clinic, you can use: ? An automated blood pressure machine at a pharmacy. ? A home blood pressure monitor.  If you are between 59 years and 61 years old, ask your health care provider if you should take aspirin to prevent strokes.  Have regular diabetes screenings. This involves taking a blood sample to check your fasting blood sugar level. ? If you are at a normal weight and have a low risk for diabetes, have this test once every three years after 73 years of age. ? If you are overweight and have a high risk for diabetes, consider being tested at a younger age or more often. Preventing infection Hepatitis B  If you have a higher risk for hepatitis B, you should be screened for this virus. You are considered at high risk for hepatitis B if: ? You were born in a country where hepatitis B is common. Ask your health care provider which countries are considered high risk. ? Your parents were born in  a high-risk country, and you have not been immunized against hepatitis B (hepatitis B vaccine). ? You have HIV or AIDS. ? You use needles to inject street drugs. ? You live with someone who has hepatitis B. ? You have had sex with someone who has hepatitis B. ? You get hemodialysis treatment. ? You take certain medicines for conditions, including cancer, organ transplantation, and autoimmune conditions.  Hepatitis C  Blood testing is recommended for: ? Everyone born from [birth year] through 1967. ? Anyone with known risk factors for hepatitis C.  Sexually transmitted infections (STIs)  You should be screened for sexually transmitted infections (STIs) including gonorrhea and chlamydia if: ? You are sexually active and are younger than 73 years of age. ? You are older than 73 years of age and your health care provider tells you that you are at risk for this type of infection. ? Your sexual activity has changed since you were last screened and you are at an increased risk for chlamydia or gonorrhea. Ask your health care provider if you are at risk.  If you do not have HIV, but are at risk, it may be recommended that you take a prescription medicine daily to prevent HIV infection. This is called pre-exposure prophylaxis (PrEP). You are considered at risk if: ? You are sexually active and do not regularly use condoms or know the HIV status of your partner(s). ? You take drugs by injection. ? You are sexually active with a partner who has HIV.  Talk with your health care provider about whether you are at high risk of being infected with HIV. If you choose to begin PrEP, you should first be tested for HIV. You should then be tested every 3 months for as long as you are taking PrEP. Pregnancy  If you are premenopausal and you may become pregnant, ask your health care provider about preconception counseling.  If you may become pregnant, take 400 to 800 micrograms (mcg) of folic acid every day.  If  you want to prevent pregnancy, talk to your health care provider about birth control (contraception). Osteoporosis and menopause  Osteoporosis is a disease in which the bones lose minerals and strength with aging. This can result in serious bone fractures. Your risk for osteoporosis can be identified using a bone density scan.  If you are 76 years of age or older, or if you are at risk for osteoporosis and fractures, ask your health care provider if you should be screened.  Ask your health care provider whether you should take a calcium or vitamin D supplement to lower your risk for osteoporosis.  Menopause may have certain physical symptoms and risks.  Hormone replacement therapy may reduce some of these symptoms and risks. Talk to your health care provider about whether hormone replacement therapy is right for you. Follow these instructions at home:  Schedule regular health, dental, and eye exams.  Stay current with your immunizations.  Do not use any tobacco products including cigarettes, chewing tobacco, or electronic cigarettes.  If you are pregnant, do not drink alcohol.  If you are breastfeeding, limit how much and how often you drink alcohol.  Limit alcohol intake to no more than 1 drink per day for nonpregnant women. One drink equals 12 ounces of beer, 5 ounces of wine, or 1 ounces of hard liquor.  Do not use street drugs.  Do not share needles.  Ask your health care provider for help if you need support or information about quitting drugs.  Tell your health care provider if you often feel depressed.  Tell your health care provider if you have ever been abused or do not feel safe at home. This information is not intended to replace advice given to you by your health care provider. Make sure you discuss any questions you have with your health care provider. Document Released: 02/20/2011 Document Revised: 01/13/2016 Document Reviewed: 05/11/2015  Chartered certified accountant  Patient Education  [employer].

## 2017-04-11 ENCOUNTER — Encounter: Payer: Self-pay | Admitting: Internal Medicine

## 2017-04-11 ENCOUNTER — Ambulatory Visit (INDEPENDENT_AMBULATORY_CARE_PROVIDER_SITE_OTHER)
Admission: RE | Admit: 2017-04-11 | Discharge: 2017-04-11 | Disposition: A | Discharge status: Home / Self Care | Payer: Medicare PPO | Source: Ambulatory Visit | Attending: Internal Medicine | Admitting: Internal Medicine

## 2017-04-11 ENCOUNTER — Other Ambulatory Visit (INDEPENDENT_AMBULATORY_CARE_PROVIDER_SITE_OTHER): Payer: Medicare PPO

## 2017-04-11 DIAGNOSIS — Z13820 Encounter for screening for osteoporosis: Secondary | ICD-10-CM

## 2017-04-11 DIAGNOSIS — Z0000 Encounter for general adult medical examination without abnormal findings: Secondary | ICD-10-CM | POA: Diagnosis not present

## 2017-04-11 DIAGNOSIS — M8580 Other specified disorders of bone density and structure, unspecified site: Secondary | ICD-10-CM | POA: Diagnosis not present

## 2017-04-11 DIAGNOSIS — I10 Essential (primary) hypertension: Secondary | ICD-10-CM

## 2017-04-11 LAB — CBC WITH DIFFERENTIAL/PLATELET
BASOS ABS: 0 10*3/uL (ref 0.0–0.1)
Basophils Relative: 0.9 % (ref 0.0–3.0)
EOS ABS: 0.2 10*3/uL (ref 0.0–0.7)
Eosinophils Relative: 3.7 % (ref 0.0–5.0)
HCT: 42.8 % (ref 36.0–46.0)
HEMOGLOBIN: 14.3 g/dL (ref 12.0–15.0)
LYMPHS ABS: 1.7 10*3/uL (ref 0.7–4.0)
Lymphocytes Relative: 33.7 % (ref 12.0–46.0)
MCHC: 33.5 g/dL (ref 30.0–36.0)
MCV: 96.2 fl (ref 78.0–100.0)
MONO ABS: 0.5 10*3/uL (ref 0.1–1.0)
Monocytes Relative: 10.2 % (ref 3.0–12.0)
NEUTROS PCT: 51.5 % (ref 43.0–77.0)
Neutro Abs: 2.6 10*3/uL (ref 1.4–7.7)
Platelets: 258 10*3/uL (ref 150.0–400.0)
RBC: 4.45 Mil/uL (ref 3.87–5.11)
RDW: 14 % (ref 11.5–15.5)
WBC: 5.1 10*3/uL (ref 4.0–10.5)

## 2017-04-11 LAB — LIPID PANEL
CHOLESTEROL: 199 mg/dL (ref 0–200)
HDL: 72.7 mg/dL (ref >39.00)
LDL CALC: 111 mg/dL — AB (ref 0–99)
NONHDL: 126.41
Total CHOL/HDL Ratio: 3
Triglycerides: 79 mg/dL (ref 0.0–149.0)
VLDL: 15.8 mg/dL (ref 0.0–40.0)

## 2017-04-11 LAB — COMPREHENSIVE METABOLIC PANEL
ALBUMIN: 4.1 g/dL (ref 3.5–5.2)
ALK PHOS: 51 U/L (ref 39–117)
ALT: 18 U/L (ref 0–35)
AST: 21 U/L (ref 0–37)
BUN: 18 mg/dL (ref 6–23)
CO2: 31 mEq/L (ref 19–32)
CREATININE: 0.96 mg/dL (ref 0.40–1.20)
Calcium: 9.6 mg/dL (ref 8.4–10.5)
Chloride: 101 mEq/L (ref 96–112)
GFR: 60.55 mL/min (ref >60.00)
GLUCOSE: 93 mg/dL (ref 70–99)
Potassium: 3.7 mEq/L (ref 3.5–5.1)
Sodium: 138 mEq/L (ref 135–145)
TOTAL PROTEIN: 6.7 g/dL (ref 6.0–8.3)
Total Bilirubin: 1 mg/dL (ref 0.2–1.2)

## 2017-04-11 LAB — TSH: TSH: 1.7 u[IU]/mL (ref 0.35–4.50)

## 2017-04-19 ENCOUNTER — Encounter: Payer: Self-pay | Admitting: Internal Medicine

## 2017-04-20 ENCOUNTER — Encounter: Payer: Self-pay | Admitting: Internal Medicine

## 2017-04-26 ENCOUNTER — Encounter: Payer: Self-pay | Admitting: Nurse Practitioner

## 2017-04-26 ENCOUNTER — Ambulatory Visit (INDEPENDENT_AMBULATORY_CARE_PROVIDER_SITE_OTHER): Payer: Medicare PPO | Admitting: Nurse Practitioner

## 2017-04-26 VITALS — BP 116/74 | HR 62 | Temp 97.6°F | Ht 62.0 in | Wt 126.0 lb

## 2017-04-26 DIAGNOSIS — J0140 Acute pansinusitis, unspecified: Secondary | ICD-10-CM

## 2017-04-26 DIAGNOSIS — K219 Gastro-esophageal reflux disease without esophagitis: Secondary | ICD-10-CM

## 2017-04-26 MED ORDER — FLUTICASONE PROPIONATE 50 MCG/ACT NA SUSP: 2.0000 | Freq: Every day | NASAL | 0 refills | Status: DC

## 2017-04-26 MED ORDER — CEFDINIR 300 MG PO CAPS
300.0000 mg | ORAL_CAPSULE | Freq: Two times a day (BID) | ORAL | 0 refills | Status: DC
Start: 2017-04-26 — End: 2017-05-03

## 2017-04-26 MED ORDER — BENZONATATE 100 MG PO CAPS: 100.0000 mg | ORAL_CAPSULE | Freq: Three times a day (TID) | ORAL | 0 refills | Status: DC | PRN

## 2017-04-26 NOTE — Progress Notes (Signed)
Subjective:  Patient ID: Ruth Jones, female    DOB: 02-26-1946  Age: 71 y.o. MRN: [id number]  CC: Nasal Congestion (congestion,coughing yellow/green mucus during the day, wrose when lay down going on for 5 days. left ear full,sore throat,headahce/ took old cough med last night)   Cough  This is a new problem. The current episode started in the past 7 days. The problem has been gradually worsening. The cough is productive of purulent sputum. Associated symptoms include heartburn, nasal congestion, postnasal drip, rhinorrhea and a sore throat. Pertinent negatives include no chest pain, chills, ear congestion, ear pain, fever, headaches, shortness of breath, sweats, weight loss or wheezing. The symptoms are aggravated by lying down and pollens. She has tried prescription cough suppressant for the symptoms. The treatment provided mild relief. Her past medical history is significant for environmental allergies.  not taking dexilant or pepcid at this time.  Outpatient Medications Prior to Visit  Medication Sig Dispense Refill  . amLODipine (NORVASC) 5 MG tablet Take 1 tablet (5 mg total) by mouth daily. 90 tablet 3  . clidinium-chlordiazePOXIDE (LIBRAX) 5-2.5 MG per capsule TAKE 1 CAPSULE BY MOUTH EVERY 8 HOURS AS NEEDED 60 capsule 2  . DEXILANT 60 MG capsule TAKE 1 CAPSULE BY MOUTH ONCE DAILY 30 capsule 3  . estradiol (ESTRACE) 0.5 MG tablet Take 0.5 mg by mouth daily.    . famotidine-calcium carbonate-magnesium hydroxide (PEPCID COMPLETE) 10-800-165 MG chewable tablet Chew 1 tablet by mouth daily as needed. 60 tablet   . losartan (COZAAR) 25 MG tablet Take 1 tablet (25 mg total) by mouth daily. 90 tablet 3  . Probiotic Product (ALIGN PO) Take by mouth.    . triamcinolone (NASACORT AQ) 55 MCG/ACT AERO nasal inhaler Place 2 sprays into the nose daily. 1 Inhaler 12  . triamterene-hydrochlorothiazide (MAXZIDE-25) 37.5-25 MG per tablet Take 1 tablet by mouth every other day.     . TURMERIC PO Take  500 mg by mouth daily.     No facility-administered medications prior to visit.     ROS See HPI  Objective:  BP 116/74   Pulse 62   Temp 97.6 F (36.4 C)   Ht 5\' 2"  (1.575 m)   Wt 126 lb (57.2 kg)   SpO2 98%   BMI 23.05 kg/m   BP Readings from Last 3 Encounters:  04/26/17 116/74  04/06/17 110/76  08/15/16 126/74    Wt Readings from Last 3 Encounters:  04/26/17 126 lb (57.2 kg)  04/06/17 126 lb (57.2 kg)  08/15/16 127 lb (57.6 kg)    Physical Exam  Constitutional: She is oriented to person, place, and time.  HENT:  Right Ear: Tympanic membrane, external ear and ear canal normal.  Left Ear: Tympanic membrane, external ear and ear canal normal.  Nose: Rhinorrhea present. No mucosal edema. Right sinus exhibits no maxillary sinus tenderness and no frontal sinus tenderness. Left sinus exhibits no maxillary sinus tenderness and no frontal sinus tenderness.  Mouth/Throat: Uvula is midline. No trismus in the jaw. Posterior oropharyngeal erythema present. No oropharyngeal exudate.  Eyes: No scleral icterus.  Neck: Normal range of motion. Neck supple.  Cardiovascular: Normal rate and normal heart sounds.   Pulmonary/Chest: Effort normal and breath sounds normal. No respiratory distress.  Musculoskeletal: She exhibits no edema.  Lymphadenopathy:    She has no cervical adenopathy.  Neurological: She is alert and oriented to person, place, and time.  Vitals reviewed.   Lab Results  Component Value Date  WBC 5.1 04/11/2017   HGB 14.3 04/11/2017   HCT 42.8 04/11/2017   PLT 258.0 04/11/2017   GLUCOSE 93 04/11/2017   CHOL 199 04/11/2017   TRIG 79.0 04/11/2017   HDL 72.70 04/11/2017   LDLDIRECT 124.8 10/09/2011   LDLCALC 111 (H) 04/11/2017   ALT 18 04/11/2017   AST 21 04/11/2017   NA 138 04/11/2017   K 3.7 04/11/2017   CL 101 04/11/2017   CREATININE 0.96 04/11/2017   BUN 18 04/11/2017   CO2 31 04/11/2017   TSH 1.70 04/11/2017    Dg Bone Density  Result Date:  04/15/2017 Date of study: 04/11/17 Exam: DUAL X-RAY ABSORPTIOMETRY (DXA) FOR BONE MINERAL DENSITY (BMD) Instrument: [employer] Provider: PCP Indication: follow up for low BMD Comparison: none (please note that it is not possible to compare data from different instruments) Clinical data: Pt is a 71 y.o. female without previous history of fracture. On calcium and vitamin D. Results:  Lumbar spine L1-L4 Femoral neck (FN) 33% distal radius T-score -1.2 RFN: -1.2 LFN: -1.2 n/a Change in BMD from previous DXA test (%) Up 1.1% Down 0.4% n/a (*) statistically significant Assessment: the BMD is low according to the [facility] classification for osteoporosis (see below). Fracture risk: moderate FRAX score: 10 year major osteoporotic risk: 10.3%. 10 year hip fracture risk: 1.9%. The thresholds for treatment are 20% and 3%, respectively. Comments: the technical quality of the study is good.  Scoliosis and degenerative change may falsely elevate spine score Evaluation for secondary causes should be considered if clinically indicated. Recommend optimizing calcium (1200 mg/day) and vitamin D (800 IU/day) intake. Followup: Repeat BMD is appropriate after 2 years or after 1-2 years if starting treatment. WHO criteria for diagnosis of osteoporosis in postmenopausal women and in men 28 y/o or older: - normal: T-score -1.0 to + 1.0 - osteopenia/low bone density: T-score between -2.5 and -1.0 - osteoporosis: T-score below -2.5 - severe osteoporosis: T-score below -2.5 with history of fragility fracture Note: although not part of the WHO classification, the presence of a fragility fracture, regardless of the T-score, should be considered diagnostic of osteoporosis, provided other causes for the fracture have been excluded. Treatment: The National Osteoporosis Foundation recommends that treatment be considered in postmenopausal women and men age 49 or older with: 1. Hip or vertebral (clinical or morphometric) fracture 2.  T-score of - 2.5 or lower at the spine or hip 3. 10-year fracture probability by FRAX of at least 20% for a major osteoporotic fracture and 3% for a hip fracture Loura Pardon MD    Assessment & Plan:   Nahima was seen today for nasal congestion.  Diagnoses and all orders for this visit:  Acute pansinusitis, recurrence not specified -     fluticasone (FLONASE) 50 MCG/ACT nasal spray; Place 2 sprays into both nostrils daily. -     benzonatate (TESSALON) 100 MG capsule; Take 1 capsule (100 mg total) by mouth 3 (three) times daily as needed for cough. -     cefdinir (OMNICEF) 300 MG capsule; Take 1 capsule (300 mg total) by mouth 2 (two) times daily.  Gastroesophageal reflux disease, esophagitis presence not specified   I am having Ms. Sproull start on fluticasone, benzonatate, and cefdinir. I am also having her maintain her Probiotic Product (ALIGN PO), triamterene-hydrochlorothiazide, TURMERIC PO, clidinium-chlordiazePOXIDE, estradiol, triamcinolone, DEXILANT, famotidine-calcium carbonate-magnesium hydroxide, losartan, and amLODipine.  Meds ordered this encounter  Medications  . fluticasone (FLONASE) 50 MCG/ACT nasal spray    Sig: Place  2 sprays into both nostrils daily.    Dispense:  16 g    Refill:  0    Order Specific Question:   Supervising Provider    Answer:   Cassandria Anger [[id number]]  . benzonatate (TESSALON) 100 MG capsule    Sig: Take 1 capsule (100 mg total) by mouth 3 (three) times daily as needed for cough.    Dispense:  20 capsule    Refill:  0    Order Specific Question:   Supervising Provider    Answer:   Cassandria Anger [[id number]]  . cefdinir (OMNICEF) 300 MG capsule    Sig: Take 1 capsule (300 mg total) by mouth 2 (two) times daily.    Dispense:  10 capsule    Refill:  0    Order Specific Question:   Supervising Provider    Answer:   Cassandria Anger [[id number]]    Follow-up: Return if symptoms worsen or fail to improve.  Wilfred Lacy, NP

## 2017-04-26 NOTE — Patient Instructions (Signed)
Resume dexilant as prescribed x 1-2weeks, then as needed.  Continue flonase.  Encourage adequate oral hydration.  Call office if no improvement in 1week.  Consider CXR if no improvement in 1week.

## 2017-05-02 ENCOUNTER — Encounter: Payer: Self-pay | Admitting: Nurse Practitioner

## 2017-05-02 LAB — HM MAMMOGRAPHY

## 2017-05-03 ENCOUNTER — Encounter: Payer: Self-pay | Admitting: Internal Medicine

## 2017-05-03 ENCOUNTER — Ambulatory Visit (INDEPENDENT_AMBULATORY_CARE_PROVIDER_SITE_OTHER): Payer: Medicare PPO | Admitting: Internal Medicine

## 2017-05-03 VITALS — BP 132/86 | HR 77 | Temp 97.7°F | Resp 16 | Wt 128.0 lb

## 2017-05-03 DIAGNOSIS — J22 Unspecified acute lower respiratory infection: Secondary | ICD-10-CM | POA: Diagnosis not present

## 2017-05-03 MED ORDER — POLYMYXIN B-TRIMETHOPRIM 10000-0.1 UNIT/ML-% OP SOLN: 1.0000 [drp] | OPHTHALMIC | 0 refills | Status: DC

## 2017-05-03 MED ORDER — DOXYCYCLINE HYCLATE 100 MG PO TABS
100.0000 mg | ORAL_TABLET | Freq: Two times a day (BID) | ORAL | 0 refills | Status: DC
Start: 2017-05-03 — End: 2018-04-07

## 2017-05-03 MED ORDER — BENZONATATE 200 MG PO CAPS: 200.0000 mg | ORAL_CAPSULE | Freq: Three times a day (TID) | ORAL | 0 refills | Status: DC | PRN

## 2017-05-03 NOTE — Assessment & Plan Note (Signed)
No improvement with omnicef - symptoms have gotten worse and is coughing up discolored phlegm Will prescribe doxycycline Continue otc cold meds, benzoate, flonase  Call if no improvement

## 2017-05-03 NOTE — Patient Instructions (Signed)
Use the eye drops for 5-7 days.  Take the oral antibiotic.  Use the cough pills and any over- the- counter medications for symptom relief.     Call if no improvement

## 2017-05-03 NOTE — Progress Notes (Signed)
Subjective:    Patient ID: Ruth Jones, female    DOB: 1946-05-16, 71 y.o.   MRN: [id number]  HPI She is here for follow up.  She started having cold symptoms at the beginning of the month.   Started out with coughing.  The cough is productive of discolored mucus.  She has left ear pain, sore throat on the left side.  Her cough is mostly at night.  She has some nasal congestion, headaches,and lightheadedness.  She denies SOB and wheeze.  She has not had fever or chills.  She was seen last week and was prescribed  Omnicef, benzoate and flonase.  Benzoate 200 mg helped.  Cough drops have helped.  Cough is somwhat better.  Was having episodes lasting 5-15 min.  - that is better.  Eyes are really red for the past 3-4 days.  Having discharge from eyes.  Today she used warm compresses.        Medications and allergies reviewed with patient and updated if appropriate.  Patient Active Problem List   Diagnosis Date Noted  . Raynaud's phenomenon 04/04/2016  . 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 History of skin cancer 02/02/2015  . 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 Snoring 01/17/2012  . Hyperlipidemia 08/02/2009  . Essential hypertension 08/02/2009  . Osteopenia 08/02/2009  . GERD 02/11/1996  . Diverticulosis of large intestine 02/11/1996    Current Outpatient Prescriptions on File Prior to Visit  Medication Sig Dispense Refill  . amLODipine (NORVASC) 5 MG tablet Take 1 tablet (5 mg total) by mouth daily. 90 tablet 3  . clidinium-chlordiazePOXIDE (LIBRAX) 5-2.5 MG per capsule TAKE 1 CAPSULE BY MOUTH EVERY 8 HOURS AS NEEDED 60 capsule 2  . DEXILANT 60 MG capsule TAKE 1 CAPSULE BY MOUTH ONCE DAILY 30 capsule 3  . estradiol (ESTRACE) 0.5 MG tablet Take 0.5 mg by mouth daily.    . famotidine-calcium carbonate-magnesium hydroxide (PEPCID COMPLETE) 10-800-165 MG chewable tablet Chew 1 tablet by mouth daily as needed. 60 tablet   . fluticasone (FLONASE) 50 MCG/ACT nasal spray Place 2  sprays into both nostrils daily. 16 g 0  . losartan (COZAAR) 25 MG tablet Take 1 tablet (25 mg total) by mouth daily. 90 tablet 3  . Probiotic Product (ALIGN PO) Take by mouth.    . triamcinolone (NASACORT AQ) 55 MCG/ACT AERO nasal inhaler Place 2 sprays into the nose daily. 1 Inhaler 12  . triamterene-hydrochlorothiazide (MAXZIDE-25) 37.5-25 MG per tablet Take 1 tablet by mouth every other day.     . TURMERIC PO Take 500 mg by mouth daily.     No current facility-administered medications on file prior to visit.     Past Medical History:  Diagnosis Date  . Colon polyps   . GERD (gastroesophageal reflux disease)   . Hyperlipidemia   . Osteopenia     Past Surgical History:  Procedure Laterality Date  . COLONOSCOPY W/ POLYPECTOMY    . MOHS SURGERY  2016   melanoma RLE  . UPPER GASTROINTESTINAL ENDOSCOPY  2011   Dr Henrene Pastor  . VESICOVAGINAL FISTULA CLOSURE W/ TAH  1990    Social History   Social History  . Marital status: Married    Spouse name: N/A  . Number of children: N/A  . Years of education: N/A   Social History Main Topics  . Smoking status: Never Smoker  . Smokeless tobacco: Never Used  . Alcohol use 0.0 oz/week     Comment:  10-12  glasses of wine weekly   . Drug use: No  . Sexual activity: Not on file   Other Topics Concern  . Not on file   Social History Narrative  . No narrative on file    Family History  Problem Relation Age of Onset  . Heart disease Father   . Osteoporosis Mother   . Lymphoma Mother        non-hodgkins  . Stroke Neg Hx   . Diabetes Neg Hx   . Colon cancer Neg Hx     Review of Systems  Constitutional: Negative for chills and fever.  HENT: Positive for congestion (some), ear pain (left ear only) and sore throat (left side). Negative for sinus pain and sinus pressure.   Respiratory: Positive for cough (productive - discolored). Negative for shortness of breath and wheezing.   Gastrointestinal: Negative for abdominal pain, diarrhea  and nausea.  Musculoskeletal: Negative for myalgias.  Neurological: Positive for light-headedness and headaches (from tessalon perles). Negative for dizziness.       Objective:   Vitals:   05/03/17 1135  BP: 132/86  Pulse: 77  Resp: 16  Temp: 97.7 F (36.5 C)  SpO2: 99%   Filed Weights   05/03/17 1135  Weight: 128 lb (58.1 kg)   Body mass index is 23.41 kg/m.  Wt Readings from Last 3 Encounters:  05/03/17 128 lb (58.1 kg)  04/26/17 126 lb (57.2 kg)  04/06/17 126 lb (57.2 kg)     Physical Exam GENERAL APPEARANCE: Appears stated age, well appearing, NAD EYES: conjunctiva clear, no icterus HEENT: bilateral tympanic membranes and ear canals normal, oropharynx with mild erythema, no thyromegaly, trachea midline, no cervical or supraclavicular lymphadenopathy LUNGS: Clear to auscultation without wheeze or crackles, unlabored breathing, good air entry bilaterally HEART: Normal S1,S2 without murmurs EXTREMITIES: Without clubbing, cyanosis, or edema        Assessment & Plan:   See Problem List for Assessment and Plan of chronic medical problems.

## 2017-05-07 ENCOUNTER — Encounter: Payer: Self-pay | Admitting: Internal Medicine

## 2017-05-17 ENCOUNTER — Encounter: Payer: Self-pay | Admitting: Internal Medicine

## 2017-06-28 ENCOUNTER — Encounter: Payer: Self-pay | Admitting: Internal Medicine

## 2017-09-06 DIAGNOSIS — H8102 Meniere's disease, left ear: Secondary | ICD-10-CM | POA: Diagnosis not present

## 2017-09-06 DIAGNOSIS — H903 Sensorineural hearing loss, bilateral: Secondary | ICD-10-CM | POA: Diagnosis not present

## 2017-09-11 ENCOUNTER — Encounter: Payer: Self-pay | Admitting: Internal Medicine

## 2017-12-20 ENCOUNTER — Telehealth: Payer: Self-pay | Admitting: Emergency Medicine

## 2017-12-20 NOTE — Telephone Encounter (Signed)
Error

## 2018-01-01 ENCOUNTER — Encounter: Payer: Self-pay | Admitting: Internal Medicine

## 2018-01-21 DIAGNOSIS — H02832 Dermatochalasis of right lower eyelid: Secondary | ICD-10-CM | POA: Diagnosis not present

## 2018-01-21 DIAGNOSIS — D3131 Benign neoplasm of right choroid: Secondary | ICD-10-CM | POA: Diagnosis not present

## 2018-01-21 DIAGNOSIS — H52223 Regular astigmatism, bilateral: Secondary | ICD-10-CM | POA: Diagnosis not present

## 2018-01-21 DIAGNOSIS — H43813 Vitreous degeneration, bilateral: Secondary | ICD-10-CM | POA: Diagnosis not present

## 2018-01-21 DIAGNOSIS — H524 Presbyopia: Secondary | ICD-10-CM | POA: Diagnosis not present

## 2018-01-21 DIAGNOSIS — H02831 Dermatochalasis of right upper eyelid: Secondary | ICD-10-CM | POA: Diagnosis not present

## 2018-01-21 DIAGNOSIS — H35373 Puckering of macula, bilateral: Secondary | ICD-10-CM | POA: Diagnosis not present

## 2018-02-07 DIAGNOSIS — Z85820 Personal history of malignant melanoma of skin: Secondary | ICD-10-CM | POA: Diagnosis not present

## 2018-02-07 DIAGNOSIS — L814 Other melanin hyperpigmentation: Secondary | ICD-10-CM | POA: Diagnosis not present

## 2018-02-07 DIAGNOSIS — D2262 Melanocytic nevi of left upper limb, including shoulder: Secondary | ICD-10-CM | POA: Diagnosis not present

## 2018-02-07 DIAGNOSIS — L821 Other seborrheic keratosis: Secondary | ICD-10-CM | POA: Diagnosis not present

## 2018-02-07 DIAGNOSIS — D225 Melanocytic nevi of trunk: Secondary | ICD-10-CM | POA: Diagnosis not present

## 2018-02-07 DIAGNOSIS — D692 Other nonthrombocytopenic purpura: Secondary | ICD-10-CM | POA: Diagnosis not present

## 2018-02-07 DIAGNOSIS — Z85828 Personal history of other malignant neoplasm of skin: Secondary | ICD-10-CM | POA: Diagnosis not present

## 2018-02-07 DIAGNOSIS — D2272 Melanocytic nevi of left lower limb, including hip: Secondary | ICD-10-CM | POA: Diagnosis not present

## 2018-03-20 ENCOUNTER — Other Ambulatory Visit: Payer: Self-pay

## 2018-03-20 DIAGNOSIS — K219 Gastro-esophageal reflux disease without esophagitis: Secondary | ICD-10-CM

## 2018-03-20 MED ORDER — DEXLANSOPRAZOLE 60 MG PO CPDR: 1.0000 | DELAYED_RELEASE_CAPSULE | Freq: Every day | ORAL | 3 refills | Status: DC

## 2018-04-05 NOTE — Progress Notes (Addendum)
Subjective:   Ruth Jones is a 72 y.o. female who presents for Medicare Annual (Subsequent) preventive examination.  Review of Systems:  No ROS.  Medicare Wellness Visit. Additional risk factors are reflected in the social history.  Cardiac Risk Factors include: advanced age (>67men, >28 women) Sleep patterns: feels rested on waking, gets up 1 times nightly to void and sleeps 7-8 hours nightly.    Home Safety/Smoke Alarms: Feels safe in home. Smoke alarms in place.  Living environment; residence and Firearm Safety: 2-story house, no firearms. Lives with husband, no needs for DME, good support system Seat Belt Safety/Bike Helmet: Wears seat belt.      Objective:     Vitals: BP (!) 106/58   Pulse 75   Resp 18   Ht 5\' 2"  (1.575 m)   Wt 127 lb (57.6 kg)   SpO2 98%   BMI 23.23 kg/m   Body mass index is 23.23 kg/m.  Advanced Directives 04/08/2018  Does Patient Have a Medical Advance Directive? No  Would patient like information on creating a medical advance directive? Yes (ED - Information included in AVS)    Tobacco Social History   Tobacco Use  Smoking Status Never Smoker  Smokeless Tobacco Never Used     Counseling given: Not Answered   Past Medical History:  Diagnosis Date  . Colon polyps   . GERD (gastroesophageal reflux disease)   . Hyperlipidemia   . Osteopenia    Past Surgical History:  Procedure Laterality Date  . COLONOSCOPY W/ POLYPECTOMY    . MOHS SURGERY  2016   melanoma RLE  . UPPER GASTROINTESTINAL ENDOSCOPY  2011   Dr Henrene Pastor  . VESICOVAGINAL FISTULA CLOSURE W/ TAH  1990   Family History  Problem Relation Age of Onset  . Heart disease Father   . Osteoporosis Mother   . Lymphoma Mother        non-hodgkins  . Stroke Neg Hx   . Diabetes Neg Hx   . Colon cancer Neg Hx    Social History   Socioeconomic History  . Marital status: Married    Spouse name: Not on file  . Number of children: 2  . Years of education: Not on file  .  Highest education level: Not on file  Occupational History  . Not on file  Social Needs  . Financial resource strain: Not hard at all  . Food insecurity:    Worry: Never true    Inability: Never true  . Transportation needs:    Medical: No    Non-medical: No  Tobacco Use  . Smoking status: Never Smoker  . Smokeless tobacco: Never Used  Substance and Sexual Activity  . Alcohol use: Yes    Comment:  10-12 glasses of Virat Prather weekly   . Drug use: No  . Sexual activity: Not Currently  Lifestyle  . Physical activity:    Days per week: 5 days    Minutes per session: 60 min  . Stress: Not at all  Relationships  . Social connections:    Talks on phone: More than three times a week    Gets together: More than three times a week    Attends religious service: More than 4 times per year    Active member of club or organization: Yes    Attends meetings of clubs or organizations: More than 4 times per year    Relationship status: Married  Other Topics Concern  . Not on file  Social  History Narrative  . Not on file    Outpatient Encounter Medications as of 04/08/2018  Medication Sig  . amLODipine (NORVASC) 5 MG tablet Take 1 tablet (5 mg total) by mouth daily.  . clidinium-chlordiazePOXIDE (LIBRAX) 5-2.5 MG per capsule TAKE 1 CAPSULE BY MOUTH EVERY 8 HOURS AS NEEDED  . dexlansoprazole (DEXILANT) 60 MG capsule Take 1 capsule (60 mg total) by mouth daily. (Patient taking differently: Take 1 capsule by mouth as needed. )  . estradiol (ESTRACE) 0.5 MG tablet Take 0.5 mg by mouth daily.  . famotidine-calcium carbonate-magnesium hydroxide (PEPCID COMPLETE) 10-800-165 MG chewable tablet Chew 1 tablet by mouth daily as needed.  . fluticasone (FLONASE) 50 MCG/ACT nasal spray Place 2 sprays into both nostrils daily.  Marland Kitchen losartan (COZAAR) 25 MG tablet Take 1 tablet (25 mg total) by mouth daily. (Patient taking differently: Take 25 mg by mouth daily. )  . Probiotic Product (ALIGN PO) Take by mouth.  .  triamcinolone (NASACORT AQ) 55 MCG/ACT AERO nasal inhaler Place 2 sprays into the nose daily.  Marland Kitchen triamterene-hydrochlorothiazide (MAXZIDE-25) 37.5-25 MG per tablet Take 1 tablet by mouth as needed.   . TURMERIC PO Take 500 mg by mouth daily.  . [DISCONTINUED] benzonatate (TESSALON) 200 MG capsule Take 1 capsule (200 mg total) by mouth 3 (three) times daily as needed for cough.  . [DISCONTINUED] doxycycline (VIBRA-TABS) 100 MG tablet Take 1 tablet (100 mg total) by mouth 2 (two) times daily.  . [DISCONTINUED] trimethoprim-polymyxin b (POLYTRIM) ophthalmic solution Place 1 drop into both eyes every 4 (four) hours.   No facility-administered encounter medications on file as of 04/08/2018.     Activities of Daily Living In your present state of health, do you have any difficulty performing the following activities: 04/08/2018  Hearing? N  Vision? N  Difficulty concentrating or making decisions? N  Walking or climbing stairs? N  Dressing or bathing? N  Doing errands, shopping? N  Preparing Food and eating ? N  Using the Toilet? N  In the past six months, have you accidently leaked urine? N  Do you have problems with loss of bowel control? N  Managing your Medications? N  Managing your Finances? N  Housekeeping or managing your Housekeeping? N  Some recent data might be hidden    Patient Care Team: Binnie Rail, MD as PCP - General (Internal Medicine)    Assessment:   This is a routine wellness examination for [facility]. Physical assessment deferred to PCP.   Exercise Activities and Dietary recommendations Current Exercise Habits: Home exercise routine;Structured exercise class, Type of exercise: walking;yoga, Time (Minutes): 60, Frequency (Times/Week): 5, Weekly Exercise (Minutes/Week): 300, Exercise limited by: None identified  Diet (meal preparation, eat out, water intake, caffeinated beverages, dairy products, fruits and vegetables): in general, a "healthy" diet  , well balanced. eats  a variety of fruits and vegetables daily, limits salt, fat/cholesterol, sugar,carbohydrates,caffeine, drinks 6-8 glasses of water daily.  Goals    . Patient Stated     Continue to exercise, eat healthy, enjoy life and family.       Fall Risk Fall Risk  04/08/2018 04/04/2016 08/24/2014  Falls in the past year? No No No    Depression Screen PHQ 2/9 Scores 04/08/2018 04/06/2017 04/04/2016 08/24/2014  PHQ - 2 Score 0 0 0 0     Cognitive Function       Ad8 score reviewed for issues:  Issues making decisions: no  Less interest in hobbies / activities: no  Repeats  questions, stories (family complaining): no  Trouble using ordinary gadgets (microwave, computer, phone):no  Forgets the month or year: no  Mismanaging finances: no  Remembering appts: no  Daily problems with thinking and/or memory: no Ad8 score is= 0  Immunization History  Administered Date(s) Administered  . Influenza Whole 06/22/2011  . Influenza, High Dose Seasonal PF 06/26/2013, 06/24/2015, 06/06/2016  . Influenza,inj,Quad PF,6+ Mos 04/30/2014  . Influenza-Unspecified 06/10/2017  . Pneumococcal Conjugate-13 07/28/2015  . Pneumococcal Polysaccharide-23 08/02/2009  . Td 08/22/2003  . Tdap 02/02/2015  . Zoster 11/18/2007  . Zoster Recombinat (Shingrix) 01/14/2017, 06/27/2017   Screening Tests Health Maintenance  Topic Date Due  . INFLUENZA VACCINE  03/21/2018  . MAMMOGRAM  05/03/2019  . DEXA SCAN  04/11/2020  . COLONOSCOPY  07/19/2020  . TETANUS/TDAP  02/01/2025  . Hepatitis C Screening  Completed  . PNA vac Low Risk Adult  Completed      Plan:     Continue doing brain stimulating activities (puzzles, reading, adult coloring books, staying active) to keep memory sharp.   Continue to eat heart healthy diet (full of fruits, vegetables, whole grains, lean protein, water--limit salt, fat, and sugar intake) and increase physical activity as tolerated.  I have personally reviewed and noted the following  in the patient's chart:   . Medical and social history . Use of alcohol, tobacco or illicit drugs  . Current medications and supplements . Functional ability and status . Nutritional status . Physical activity . Advanced directives . List of other physicians . Vitals . Screenings to include cognitive, depression, and falls . Referrals and appointments  In addition, I have reviewed and discussed with patient certain preventive protocols, quality metrics, and best practice recommendations. A written personalized care plan for preventive services as well as general preventive health recommendations were provided to patient.     Michiel Cowboy, RN  04/08/2018   Medical screening examination/treatment/procedure(s) were performed by non-physician practitioner and as supervising physician I was immediately available for consultation/collaboration. I agree with above. Binnie Rail, MD

## 2018-04-07 NOTE — Patient Instructions (Addendum)
Test(s) ordered today. Your results will be released to MyChart (or called to you) after review, usually within 72hours after test completion. If any changes need to be made, you will be notified at that same time.  All other Health Maintenance issues reviewed.   All recommended immunizations and age-appropriate screenings are up-to-date or discussed.  No immunizations administered today.   Medications reviewed and updated.  No changes recommended at this time.    Please followup in one year   Health Maintenance, Female Adopting a healthy lifestyle and getting preventive care can go a long way to promote health and wellness. Talk with your health care provider about what schedule of regular examinations is right for you. This is a good chance for you to check in with your provider about disease prevention and staying healthy. In between checkups, there are plenty of things you can do on your own. Experts have done a lot of research about which lifestyle changes and preventive measures are most likely to keep you healthy. Ask your health care provider for more information. Weight and diet Eat a healthy diet  Be sure to include plenty of vegetables, fruits, low-fat dairy products, and lean protein.  Do not eat a lot of foods high in solid fats, added sugars, or salt.  Get regular exercise. This is one of the most important things you can do for your health. ? Most adults should exercise for at least 150 minutes each week. The exercise should increase your heart rate and make you sweat (moderate-intensity exercise). ? Most adults should also do strengthening exercises at least twice a week. This is in addition to the moderate-intensity exercise.  Maintain a healthy weight  Body mass index (BMI) is a measurement that can be used to identify possible weight problems. It estimates body fat based on height and weight. Your health care provider can help determine your BMI and help you achieve  or maintain a healthy weight.  For females 20 years of age and older: ? A BMI below 18.5 is considered underweight. ? A BMI of 18.5 to 24.9 is normal. ? A BMI of 25 to 29.9 is considered overweight. ? A BMI of 30 and above is considered obese.  Watch levels of cholesterol and blood lipids  You should start having your blood tested for lipids and cholesterol at 74 years of age, then have this test every 5 years.  You may need to have your cholesterol levels checked more often if: ? Your lipid or cholesterol levels are high. ? You are older than 74 years of age. ? You are at high risk for heart disease.  Cancer screening Lung Cancer  Lung cancer screening is recommended for adults 55-80 years old who are at high risk for lung cancer because of a history of smoking.  A yearly low-dose CT scan of the lungs is recommended for people who: ? Currently smoke. ? Have quit within the past 15 years. ? Have at least a 30-pack-year history of smoking. A pack year is smoking an average of one pack of cigarettes a day for 1 year.  Yearly screening should continue until it has been 15 years since you quit.  Yearly screening should stop if you develop a health problem that would prevent you from having lung cancer treatment.  Breast Cancer  Practice breast self-awareness. This means understanding how your breasts normally appear and feel.  It also means doing regular breast self-exams. Let your health care provider know about any   changes, no matter how small.  If you are in your 20s or 30s, you should have a clinical breast exam (CBE) by a health care provider every 1-3 years as part of a regular health exam.  If you are 40 or older, have a CBE every year. Also consider having a breast X-ray (mammogram) every year.  If you have a family history of breast cancer, talk to your health care provider about genetic screening.  If you are at high risk for breast cancer, talk to your health care  provider about having an MRI and a mammogram every year.  Breast cancer gene (BRCA) assessment is recommended for women who have family members with BRCA-related cancers. BRCA-related cancers include: ? Breast. ? Ovarian. ? Tubal. ? Peritoneal cancers.  Results of the assessment will determine the need for genetic counseling and BRCA1 and BRCA2 testing.  Cervical Cancer Your health care provider may recommend that you be screened regularly for cancer of the pelvic organs (ovaries, uterus, and vagina). This screening involves a pelvic examination, including checking for microscopic changes to the surface of your cervix (Pap test). You may be encouraged to have this screening done every 3 years, beginning at age 21.  For women ages 30-65, health care providers may recommend pelvic exams and Pap testing every 3 years, or they may recommend the Pap and pelvic exam, combined with testing for human papilloma virus (HPV), every 5 years. Some types of HPV increase your risk of cervical cancer. Testing for HPV may also be done on women of any age with unclear Pap test results.  Other health care providers may not recommend any screening for nonpregnant women who are considered low risk for pelvic cancer and who do not have symptoms. Ask your health care provider if a screening pelvic exam is right for you.  If you have had past treatment for cervical cancer or a condition that could lead to cancer, you need Pap tests and screening for cancer for at least 20 years after your treatment. If Pap tests have been discontinued, your risk factors (such as having a new sexual partner) need to be reassessed to determine if screening should resume. Some women have medical problems that increase the chance of getting cervical cancer. In these cases, your health care provider may recommend more frequent screening and Pap tests.  Colorectal Cancer  This type of cancer can be detected and often prevented.  Routine  colorectal cancer screening usually begins at 74 years of age and continues through 75 years of age.  Your health care provider may recommend screening at an earlier age if you have risk factors for colon cancer.  Your health care provider may also recommend using home test kits to check for hidden blood in the stool.  A small camera at the end of a tube can be used to examine your colon directly (sigmoidoscopy or colonoscopy). This is done to check for the earliest forms of colorectal cancer.  Routine screening usually begins at age 50.  Direct examination of the colon should be repeated every 5-10 years through 75 years of age. However, you may need to be screened more often if early forms of precancerous polyps or small growths are found.  Skin Cancer  Check your skin from head to toe regularly.  Tell your health care provider about any new moles or changes in moles, especially if there is a change in a mole's shape or color.  Also tell your health care provider if   you have a mole that is larger than the size of a pencil eraser.  Always use sunscreen. Apply sunscreen liberally and repeatedly throughout the day.  Protect yourself by wearing long sleeves, pants, a wide-brimmed hat, and sunglasses whenever you are outside.  Heart disease, diabetes, and high blood pressure  High blood pressure causes heart disease and increases the risk of stroke. High blood pressure is more likely to develop in: ? People who have blood pressure in the high end of the normal range (130-139/85-89 mm Hg). ? People who are overweight or obese. ? People who are African American.  If you are 55-1 years of age, have your blood pressure checked every 3-5 years. If you are 86 years of age or older, have your blood pressure checked every year. You should have your blood pressure measured twice-once when you are at a hospital or clinic, and once when you are not at a hospital or clinic. Record the average of the  two measurements. To check your blood pressure when you are not at a hospital or clinic, you can use: ? An automated blood pressure machine at a pharmacy. ? A home blood pressure monitor.  If you are between 15 years and 81 years old, ask your health care provider if you should take aspirin to prevent strokes.  Have regular diabetes screenings. This involves taking a blood sample to check your fasting blood sugar level. ? If you are at a normal weight and have a low risk for diabetes, have this test once every three years after 74 years of age. ? If you are overweight and have a high risk for diabetes, consider being tested at a younger age or more often. Preventing infection Hepatitis B  If you have a higher risk for hepatitis B, you should be screened for this virus. You are considered at high risk for hepatitis B if: ? You were born in a country where hepatitis B is common. Ask your health care provider which countries are considered high risk. ? Your parents were born in a high-risk country, and you have not been immunized against hepatitis B (hepatitis B vaccine). ? You have HIV or AIDS. ? You use needles to inject street drugs. ? You live with someone who has hepatitis B. ? You have had sex with someone who has hepatitis B. ? You get hemodialysis treatment. ? You take certain medicines for conditions, including cancer, organ transplantation, and autoimmune conditions.  Hepatitis C  Blood testing is recommended for: ? Everyone born from [birth year] through 1967. ? Anyone with known risk factors for hepatitis C.  Sexually transmitted infections (STIs)  You should be screened for sexually transmitted infections (STIs) including gonorrhea and chlamydia if: ? You are sexually active and are younger than 74 years of age. ? You are older than 74 years of age and your health care provider tells you that you are at risk for this type of infection. ? Your sexual activity has changed since you  were last screened and you are at an increased risk for chlamydia or gonorrhea. Ask your health care provider if you are at risk.  If you do not have HIV, but are at risk, it may be recommended that you take a prescription medicine daily to prevent HIV infection. This is called pre-exposure prophylaxis (PrEP). You are considered at risk if: ? You are sexually active and do not regularly use condoms or know the HIV status of your partner(s). ? You take drugs by  injection. ? You are sexually active with a partner who has HIV.  Talk with your health care provider about whether you are at high risk of being infected with HIV. If you choose to begin PrEP, you should first be tested for HIV. You should then be tested every 3 months for as long as you are taking PrEP. Pregnancy  If you are premenopausal and you may become pregnant, ask your health care provider about preconception counseling.  If you may become pregnant, take 400 to 800 micrograms (mcg) of folic acid every day.  If you want to prevent pregnancy, talk to your health care provider about birth control (contraception). Osteoporosis and menopause  Osteoporosis is a disease in which the bones lose minerals and strength with aging. This can result in serious bone fractures. Your risk for osteoporosis can be identified using a bone density scan.  If you are 69 years of age or older, or if you are at risk for osteoporosis and fractures, ask your health care provider if you should be screened.  Ask your health care provider whether you should take a calcium or vitamin D supplement to lower your risk for osteoporosis.  Menopause may have certain physical symptoms and risks.  Hormone replacement therapy may reduce some of these symptoms and risks. Talk to your health care provider about whether hormone replacement therapy is right for you. Follow these instructions at home:  Schedule regular health, dental, and eye exams.  Stay current  with your immunizations.  Do not use any tobacco products including cigarettes, chewing tobacco, or electronic cigarettes.  If you are pregnant, do not drink alcohol.  If you are breastfeeding, limit how much and how often you drink alcohol.  Limit alcohol intake to no more than 1 drink per day for nonpregnant women. One drink equals 12 ounces of beer, 5 ounces of wine, or 1 ounces of hard liquor.  Do not use street drugs.  Do not share needles.  Ask your health care provider for help if you need support or information about quitting drugs.  Tell your health care provider if you often feel depressed.  Tell your health care provider if you have ever been abused or do not feel safe at home. This information is not intended to replace advice given to you by your health care provider. Make sure you discuss any questions you have with your health care provider. Document Released: 02/20/2011 Document Revised: 01/13/2016 Document Reviewed: 05/11/2015 Elsevier Interactive Patient Education  [employer].

## 2018-04-07 NOTE — Progress Notes (Signed)
Subjective:    Patient ID: Ruth Jones, female    DOB: 1946/02/03, 72 y.o.   MRN: [id number]  HPI She is here for a physical exam.   When she wakes up in the morning her legs ache.  During the day she denies any aching.  She some swelling in her RLE > LLE in her feet late in the day.  the swelling goes away over night.    She takes the maxzide every 5 days and does not take the losartan on those days.  She takes the amlodipine daily.  She does not think she needs all three medications. She does not monitor her BP at home.     Medications and allergies reviewed with patient and updated if appropriate.  Patient Active Problem List   Diagnosis Date Noted  . Raynaud's phenomenon 04/04/2016  . 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 History of skin cancer 02/02/2015  . 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 Snoring 01/17/2012  . Hyperlipidemia 08/02/2009  . Essential hypertension 08/02/2009  . Osteopenia 08/02/2009  . GERD 02/11/1996  . Diverticulosis of large intestine 02/11/1996    Current Outpatient Medications on File Prior to Visit  Medication Sig Dispense Refill  . amLODipine (NORVASC) 5 MG tablet Take 1 tablet (5 mg total) by mouth daily. 90 tablet 3  . dexlansoprazole (DEXILANT) 60 MG capsule Take 1 capsule (60 mg total) by mouth daily. (Patient taking differently: Take 1 capsule by mouth as needed. ) 30 capsule 3  . estradiol (ESTRACE) 0.5 MG tablet Take 0.5 mg by mouth daily.    . famotidine-calcium carbonate-magnesium hydroxide (PEPCID COMPLETE) 10-800-165 MG chewable tablet Chew 1 tablet by mouth daily as needed. 60 tablet   . fluticasone (FLONASE) 50 MCG/ACT nasal spray Place 2 sprays into both nostrils daily. 16 g 0  . losartan (COZAAR) 25 MG tablet Take 1 tablet (25 mg total) by mouth daily. (Patient taking differently: Take 25 mg by mouth daily. ) 90 tablet 3  . triamcinolone (NASACORT AQ) 55 MCG/ACT AERO nasal inhaler Place 2 sprays into the nose daily. 1 Inhaler  12  . triamterene-hydrochlorothiazide (MAXZIDE-25) 37.5-25 MG per tablet Take 1 tablet by mouth as needed.     . TURMERIC PO Take 500 mg by mouth daily.     No current facility-administered medications on file prior to visit.     Past Medical History:  Diagnosis Date  . Colon polyps   . GERD (gastroesophageal reflux disease)   . Hyperlipidemia   . Osteopenia     Past Surgical History:  Procedure Laterality Date  . COLONOSCOPY W/ POLYPECTOMY    . MOHS SURGERY  2016   melanoma RLE  . UPPER GASTROINTESTINAL ENDOSCOPY  2011   Dr Henrene Pastor  . VESICOVAGINAL FISTULA CLOSURE W/ TAH  1990    Social History   Socioeconomic History  . Marital status: Married    Spouse name: Not on file  . Number of children: 2  . Years of education: Not on file  . Highest education level: Not on file  Occupational History  . Not on file  Social Needs  . Financial resource strain: Not hard at all  . Food insecurity:    Worry: Never true    Inability: Never true  . Transportation needs:    Medical: No    Non-medical: No  Tobacco Use  . Smoking status: Never Smoker  . Smokeless tobacco: Never Used  Substance and Sexual Activity  . Alcohol  use: Yes    Comment:  10-12 glasses of wine weekly   . Drug use: No  . Sexual activity: Not Currently  Lifestyle  . Physical activity:    Days per week: 5 days    Minutes per session: 60 min  . Stress: Not at all  Relationships  . Social connections:    Talks on phone: More than three times a week    Gets together: More than three times a week    Attends religious service: More than 4 times per year    Active member of club or organization: Yes    Attends meetings of clubs or organizations: More than 4 times per year    Relationship status: Married  Other Topics Concern  . Not on file  Social History Narrative  . Not on file    Family History  Problem Relation Age of Onset  . Heart disease Father   . Osteoporosis Mother   . Lymphoma Mother         non-hodgkins  . Stroke Neg Hx   . Diabetes Neg Hx   . Colon cancer Neg Hx     Review of Systems  Constitutional: Negative for chills, fatigue and fever.  Eyes: Negative for visual disturbance.  Respiratory: Negative for cough, shortness of breath and wheezing.   Cardiovascular: Positive for leg swelling (feet occasionally). Negative for chest pain and palpitations.  Gastrointestinal: Positive for constipation (occasional). Negative for abdominal pain, diarrhea and nausea.  Genitourinary: Negative for dysuria.  Musculoskeletal: Negative for arthralgias and back pain.  Skin: Negative for color change and rash.  Neurological: Positive for headaches (rare). Negative for dizziness and light-headedness.  Psychiatric/Behavioral: Negative for dysphoric mood and sleep disturbance. The patient is not nervous/anxious.        Objective:   Vitals:   04/09/18 0914  BP: 140/80  Pulse: 61  Resp: 16  Temp: 98.1 F (36.7 C)  SpO2: 98%   Filed Weights   04/09/18 0914  Weight: 129 lb (58.5 kg)   Body mass index is 23.59 kg/m.   BP Readings from Last 3 Encounters:  04/09/18 140/80  04/08/18 (!) 106/58  05/03/17 132/86    Wt Readings from Last 3 Encounters:  04/09/18 129 lb (58.5 kg)  04/08/18 127 lb (57.6 kg)  05/03/17 128 lb (58.1 kg)     Physical Exam Constitutional: She appears well-developed and well-nourished. No distress.  HENT:  Head: Normocephalic and atraumatic.  Right Ear: External ear normal. Normal ear canal and TM Left Ear: External ear normal.  Normal ear canal and TM Mouth/Throat: Oropharynx is clear and moist.  Eyes: Conjunctivae and EOM are normal.  Neck: Neck supple. No tracheal deviation present. No thyromegaly present.  No carotid bruit  Cardiovascular: Normal rate, regular rhythm and normal heart sounds.   No murmur heard.  No edema. Pulmonary/Chest: Effort normal and breath sounds normal. No respiratory distress. She has no wheezes. She has no  rales.  Breast: deferred to Gyn Abdominal: Soft. She exhibits no distension. There is no tenderness.  Lymphadenopathy: She has no cervical adenopathy.  Skin: Skin is warm and dry. She is not diaphoretic.  Psychiatric: She has a normal mood and affect. Her behavior is normal.        Assessment & Plan:   Physical exam: Screening blood work   ordered Immunizations   Had shingrix, others up to date Colonoscopy   Up to date  Mammogram   Up to date  Gyn  Up  to date  Dexa    Up to date  Eye exams   Up to date  EKG     Done 03/2017 Exercise   Walking, yoga Weight   Normal BMI Skin   No concerns, seeing derm Substance abuse   - assess etoh use -   See Problem List for Assessment and Plan of chronic medical problems.   FU in one year

## 2018-04-08 ENCOUNTER — Ambulatory Visit (INDEPENDENT_AMBULATORY_CARE_PROVIDER_SITE_OTHER): Payer: Medicare HMO | Admitting: *Deleted

## 2018-04-08 VITALS — BP 106/58 | HR 75 | Resp 18 | Ht 62.0 in | Wt 127.0 lb

## 2018-04-08 DIAGNOSIS — Z0000 Encounter for general adult medical examination without abnormal findings: Secondary | ICD-10-CM

## 2018-04-08 NOTE — Patient Instructions (Addendum)
Continue doing brain stimulating activities (puzzles, reading, adult coloring books, staying active) to keep memory sharp.   Continue to eat heart healthy diet (full of fruits, vegetables, whole grains, lean protein, water--limit salt, fat, and sugar intake) and increase physical activity as tolerated.   Ruth Jones , Thank you for taking time to come for your Medicare Wellness Visit. I appreciate your ongoing commitment to your health goals. Please review the following plan we discussed and let me know if I can assist you in the future.   These are the goals we discussed: Goals    . Patient Stated     Continue to exercise, eat healthy, enjoy life and family.       This is a list of the screening recommended for you and due dates:  Health Maintenance  Topic Date Due  . Flu Shot  03/21/2018  . Mammogram  05/03/2019  . DEXA scan (bone density measurement)  04/11/2020  . Colon Cancer Screening  07/19/2020  . Tetanus Vaccine  02/01/2025  .  Hepatitis C: One time screening is recommended by Center for Disease Control  (CDC) for  adults born from [birth year] through 1967.   Completed  . Pneumonia vaccines  Completed

## 2018-04-09 ENCOUNTER — Encounter: Payer: Self-pay | Admitting: Internal Medicine

## 2018-04-09 ENCOUNTER — Ambulatory Visit (INDEPENDENT_AMBULATORY_CARE_PROVIDER_SITE_OTHER): Payer: Medicare HMO | Admitting: Internal Medicine

## 2018-04-09 VITALS — BP 140/80 | HR 61 | Temp 98.1°F | Resp 16 | Ht 62.0 in | Wt 129.0 lb

## 2018-04-09 DIAGNOSIS — Z0000 Encounter for general adult medical examination without abnormal findings: Secondary | ICD-10-CM

## 2018-04-09 DIAGNOSIS — I7300 Raynaud's syndrome without gangrene: Secondary | ICD-10-CM | POA: Diagnosis not present

## 2018-04-09 DIAGNOSIS — K219 Gastro-esophageal reflux disease without esophagitis: Secondary | ICD-10-CM | POA: Diagnosis not present

## 2018-04-09 DIAGNOSIS — M8580 Other specified disorders of bone density and structure, unspecified site: Secondary | ICD-10-CM

## 2018-04-09 DIAGNOSIS — I10 Essential (primary) hypertension: Secondary | ICD-10-CM

## 2018-04-09 DIAGNOSIS — Z85828 Personal history of other malignant neoplasm of skin: Secondary | ICD-10-CM

## 2018-04-09 DIAGNOSIS — E782 Mixed hyperlipidemia: Secondary | ICD-10-CM | POA: Diagnosis not present

## 2018-04-09 NOTE — Assessment & Plan Note (Signed)
dexa up todate Continue regular exercise Gets calcium and vitamin d via diet - does not want to supplement

## 2018-04-09 NOTE — Assessment & Plan Note (Signed)
BP ok today She will continue her current regimen and start monitoring her BP at home Taking amolodipine 5 mg daily Takes losartan daily - except for every 5th day hold losartan and takes maxzide cmp

## 2018-04-09 NOTE — Assessment & Plan Note (Signed)
Seeing dermatology annually

## 2018-04-09 NOTE — Assessment & Plan Note (Addendum)
Takes pepcid as needed Takes dexilant only if pepcid is not effective - only takes for a few days if needed Continue above

## 2018-04-09 NOTE — Assessment & Plan Note (Signed)
Improved with amlodipine

## 2018-04-09 NOTE — Assessment & Plan Note (Signed)
Check lipid panel, tsh, cmp Regular exercise and healthy diet encouraged  

## 2018-04-10 ENCOUNTER — Other Ambulatory Visit (INDEPENDENT_AMBULATORY_CARE_PROVIDER_SITE_OTHER): Payer: Medicare HMO

## 2018-04-10 DIAGNOSIS — I10 Essential (primary) hypertension: Secondary | ICD-10-CM

## 2018-04-10 DIAGNOSIS — E782 Mixed hyperlipidemia: Secondary | ICD-10-CM

## 2018-04-10 LAB — CBC WITH DIFFERENTIAL/PLATELET
BASOS PCT: 1.1 % (ref 0.0–3.0)
Basophils Absolute: 0.1 10*3/uL (ref 0.0–0.1)
EOS PCT: 4 % (ref 0.0–5.0)
Eosinophils Absolute: 0.2 10*3/uL (ref 0.0–0.7)
HCT: 42.1 % (ref 36.0–46.0)
Hemoglobin: 14 g/dL (ref 12.0–15.0)
LYMPHS ABS: 1.6 10*3/uL (ref 0.7–4.0)
Lymphocytes Relative: 31.8 % (ref 12.0–46.0)
MCHC: 33.4 g/dL (ref 30.0–36.0)
MCV: 92.4 fl (ref 78.0–100.0)
MONO ABS: 0.5 10*3/uL (ref 0.1–1.0)
Monocytes Relative: 10.7 % (ref 3.0–12.0)
NEUTROS ABS: 2.6 10*3/uL (ref 1.4–7.7)
NEUTROS PCT: 52.4 % (ref 43.0–77.0)
PLATELETS: 263 10*3/uL (ref 150.0–400.0)
RBC: 4.55 Mil/uL (ref 3.87–5.11)
RDW: 14 % (ref 11.5–15.5)
WBC: 5 10*3/uL (ref 4.0–10.5)

## 2018-04-10 LAB — COMPREHENSIVE METABOLIC PANEL
ALK PHOS: 54 U/L (ref 39–117)
ALT: 19 U/L (ref 0–35)
AST: 23 U/L (ref 0–37)
Albumin: 4 g/dL (ref 3.5–5.2)
BUN: 10 mg/dL (ref 6–23)
CO2: 26 meq/L (ref 19–32)
Calcium: 9.3 mg/dL (ref 8.4–10.5)
Chloride: 104 mEq/L (ref 96–112)
Creatinine, Ser: 0.97 mg/dL (ref 0.40–1.20)
GFR: 59.66 mL/min — ABNORMAL LOW (ref >60.00)
GLUCOSE: 94 mg/dL (ref 70–99)
POTASSIUM: 3.8 meq/L (ref 3.5–5.1)
SODIUM: 139 meq/L (ref 135–145)
TOTAL PROTEIN: 6.8 g/dL (ref 6.0–8.3)
Total Bilirubin: 0.9 mg/dL (ref 0.2–1.2)

## 2018-04-10 LAB — LIPID PANEL
Cholesterol: 206 mg/dL — ABNORMAL HIGH (ref 0–200)
HDL: 77.2 mg/dL (ref >39.00)
LDL Cholesterol: 110 mg/dL — ABNORMAL HIGH (ref 0–99)
NONHDL: 128.54
Total CHOL/HDL Ratio: 3
Triglycerides: 95 mg/dL (ref 0.0–149.0)
VLDL: 19 mg/dL (ref 0.0–40.0)

## 2018-04-10 LAB — TSH: TSH: 1.13 u[IU]/mL (ref 0.35–4.50)

## 2018-04-11 ENCOUNTER — Encounter: Payer: Self-pay | Admitting: Internal Medicine

## 2018-04-14 ENCOUNTER — Encounter: Payer: Self-pay | Admitting: Internal Medicine

## 2018-04-15 ENCOUNTER — Encounter: Payer: Self-pay | Admitting: Internal Medicine

## 2018-05-09 ENCOUNTER — Ambulatory Visit (INDEPENDENT_AMBULATORY_CARE_PROVIDER_SITE_OTHER): Payer: Medicare HMO

## 2018-05-09 DIAGNOSIS — Z23 Encounter for immunization: Secondary | ICD-10-CM

## 2018-05-19 ENCOUNTER — Encounter: Payer: Self-pay | Admitting: Internal Medicine

## 2018-05-19 ENCOUNTER — Other Ambulatory Visit: Payer: Self-pay | Admitting: Internal Medicine

## 2018-05-27 DIAGNOSIS — Z6823 Body mass index (BMI) 23.0-23.9, adult: Secondary | ICD-10-CM | POA: Diagnosis not present

## 2018-05-27 DIAGNOSIS — Z1231 Encounter for screening mammogram for malignant neoplasm of breast: Secondary | ICD-10-CM | POA: Diagnosis not present

## 2018-05-27 DIAGNOSIS — Z01419 Encounter for gynecological examination (general) (routine) without abnormal findings: Secondary | ICD-10-CM | POA: Diagnosis not present

## 2018-05-27 LAB — HM MAMMOGRAPHY

## 2018-05-29 LAB — HM MAMMOGRAPHY

## 2018-06-04 ENCOUNTER — Encounter: Payer: Self-pay | Admitting: Internal Medicine

## 2018-06-10 ENCOUNTER — Encounter: Payer: Self-pay | Admitting: Internal Medicine

## 2018-06-23 ENCOUNTER — Other Ambulatory Visit: Payer: Self-pay | Admitting: Internal Medicine

## 2018-07-01 ENCOUNTER — Encounter: Payer: Self-pay | Admitting: Internal Medicine

## 2018-07-02 DIAGNOSIS — H8102 Meniere's disease, left ear: Secondary | ICD-10-CM | POA: Diagnosis not present

## 2018-07-02 DIAGNOSIS — R69 Illness, unspecified: Secondary | ICD-10-CM | POA: Diagnosis not present

## 2018-07-02 DIAGNOSIS — H903 Sensorineural hearing loss, bilateral: Secondary | ICD-10-CM | POA: Diagnosis not present

## 2018-10-17 ENCOUNTER — Encounter: Payer: Self-pay | Admitting: Internal Medicine

## 2018-10-22 ENCOUNTER — Encounter: Payer: Self-pay | Admitting: Internal Medicine

## 2018-10-22 ENCOUNTER — Ambulatory Visit: Payer: Medicare HMO | Admitting: Internal Medicine

## 2018-10-22 VITALS — BP 154/82 | HR 70 | Temp 98.1°F | Resp 16 | Ht 62.0 in | Wt 130.8 lb

## 2018-10-22 DIAGNOSIS — M722 Plantar fascial fibromatosis: Secondary | ICD-10-CM

## 2018-10-22 HISTORY — DX: Plantar fascial fibromatosis: M72.2

## 2018-10-22 MED ORDER — MELOXICAM 15 MG PO TABS: 15.0000 mg | ORAL_TABLET | Freq: Every day | ORAL | 1 refills | Status: DC

## 2018-10-22 NOTE — Patient Instructions (Addendum)
Do stretching as many times a day.  Take the meloxicam daily with food - stop if you have any side effects (stomach pain, heartburn).   Use an ice bottle on the bottom of your foot.    Let me know if there is no improvement and I can refer you to a specialist.      Plantar Fasciitis Rehab Ask your health care provider which exercises are safe for you. Do exercises exactly as told by your health care provider and adjust them as directed. It is normal to feel mild stretching, pulling, tightness, or discomfort as you do these exercises, but you should stop right away if you feel sudden pain or your pain gets worse. Do not begin these exercises until told by your health care provider. Stretching and range of motion exercises These exercises warm up your muscles and joints and improve the movement and flexibility of your foot. These exercises also help to relieve pain. Exercise A: Plantar fascia stretch  1. Sit with your left / right leg crossed over your opposite knee. 2. Hold your heel with one hand with that thumb near your arch. With your other hand, hold your toes and gently pull them back toward the top of your foot. You should feel a stretch on the bottom of your toes or your foot or both. 3. Hold this stretch for__________ seconds. 4. Slowly release your toes and return to the starting position. Repeat __________ times. Complete this exercise __________ times a day. Exercise B: Gastroc, standing  1. Stand with your hands against a wall. 2. Extend your left / right leg behind you, and bend your front knee slightly. 3. Keeping your heels on the floor and keeping your back knee straight, shift your weight toward the wall without arching your back. You should feel a gentle stretch in your left / right calf. 4. Hold this position for __________ seconds. Repeat __________ times. Complete this exercise __________ times a day. Exercise C: Soleus, standing 1. Stand with your hands against a  wall. 2. Extend your left / right leg behind you, and bend your front knee slightly. 3. Keeping your heels on the floor, bend your back knee and slightly shift your weight over the back leg. You should feel a gentle stretch deep in your calf. 4. Hold this position for __________ seconds. Repeat __________ times. Complete this exercise __________ times a day. Exercise D: Gastrocsoleus, standing 1. Stand with the ball of your left / right foot on a step. The ball of your foot is on the walking surface, right under your toes. 2. Keep your other foot firmly on the same step. 3. Hold onto the wall or a railing for balance. 4. Slowly lift your other foot, allowing your body weight to press your heel down over the edge of the step. You should feel a stretch in your left / right calf. 5. Hold this position for __________ seconds. 6. Return both feet to the step. 7. Repeat this exercise with a slight bend in your left / right knee. Repeat __________ times with your left / right knee straight and __________ times with your left / right knee bent. Complete this exercise __________ times a day. Balance exercise This exercise builds your balance and strength control of your arch to help take pressure off your plantar fascia. Exercise E: Single leg stand 1. Without shoes, stand near a railing or in a doorway. You may hold onto the railing or door frame as needed. 2.  Stand on your left / right foot. Keep your big toe down on the floor and try to keep your arch lifted. Do not let your foot roll inward. 3. Hold this position for __________ seconds. 4. If this exercise is too easy, you can try it with your eyes closed or while standing on a pillow. Repeat __________ times. Complete this exercise __________ times a day. This information is not intended to replace advice given to you by your health care provider. Make sure you discuss any questions you have with your health care provider. Document Released:  08/07/2005 Document Revised: 04/11/2016 Document Reviewed: 06/21/2015 Elsevier Interactive Patient Education  2019 [employer].

## 2018-10-22 NOTE — Assessment & Plan Note (Signed)
Symptoms and exam consistent with bilateral plantar fasciitis Discussed the importance of stretching-handout given and advised to look on the Internet for additional stretches Meloxicam 15 mg daily-take with food and discontinue with any stomach upset or GERD.  Do not take any NSAIDs while taking meloxicam Ice Avoid going barefoot Discussed the importance of good foot wear If symptoms do not improve or worsen she will let me know-can refer to podiatry/orthopedics

## 2018-10-22 NOTE — Progress Notes (Signed)
Subjective:    Patient ID: Ruth Jones, female    DOB: May 14, 1946, 73 y.o.   MRN: [id number]  HPI The patient is here for an acute visit.   B/l heel pain x 3 months:  It did get better, but then walked for 3 days straight and got worse.  The pain is at the base of the heel.  First thing in the morning it is more painful and it feels tight.  She has pain throughout the day.  She usually wears her walking shoes most of the time.    She denies swelling, numbness, tingling.  advil helps.     Medications and allergies reviewed with patient and updated if appropriate.  Patient Active Problem List   Diagnosis Date Noted  . Raynaud's phenomenon 04/04/2016  . 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 History of skin cancer 02/02/2015  . 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 Snoring 01/17/2012  . Hyperlipidemia 08/02/2009  . Essential hypertension 08/02/2009  . Osteopenia 08/02/2009  . GERD 02/11/1996  . Diverticulosis of large intestine 02/11/1996    Current Outpatient Medications on File Prior to Visit  Medication Sig Dispense Refill  . amLODipine (NORVASC) 5 MG tablet TAKE 1 TABLET(5 MG) BY MOUTH DAILY 90 tablet 2  . dexlansoprazole (DEXILANT) 60 MG capsule Take 1 capsule (60 mg total) by mouth daily. (Patient taking differently: Take 1 capsule by mouth as needed. ) 30 capsule 3  . estradiol (ESTRACE) 0.5 MG tablet Take 0.5 mg by mouth daily.    . famotidine-calcium carbonate-magnesium hydroxide (PEPCID COMPLETE) 10-800-165 MG chewable tablet Chew 1 tablet by mouth daily as needed. 60 tablet   . fluticasone (FLONASE) 50 MCG/ACT nasal spray Place 2 sprays into both nostrils daily. 16 g 0  . losartan (COZAAR) 25 MG tablet Take 1 tablet (25 mg total) by mouth daily. 90 tablet 3  . triamcinolone (NASACORT AQ) 55 MCG/ACT AERO nasal inhaler Place 2 sprays into the nose daily. 1 Inhaler 12  . triamterene-hydrochlorothiazide (MAXZIDE-25) 37.5-25 MG per tablet Take 1 tablet by mouth as  needed.     . TURMERIC PO Take 500 mg by mouth daily.     No current facility-administered medications on file prior to visit.     Past Medical History:  Diagnosis Date  . Colon polyps   . GERD (gastroesophageal reflux disease)   . Hyperlipidemia   . Osteopenia     Past Surgical History:  Procedure Laterality Date  . COLONOSCOPY W/ POLYPECTOMY    . MOHS SURGERY  2016   melanoma RLE  . UPPER GASTROINTESTINAL ENDOSCOPY  2011   Dr Henrene Pastor  . VESICOVAGINAL FISTULA CLOSURE W/ TAH  1990    Social History   Socioeconomic History  . Marital status: Married    Spouse name: Not on file  . Number of children: 2  . Years of education: Not on file  . Highest education level: Not on file  Occupational History  . Not on file  Social Needs  . Financial resource strain: Not hard at all  . Food insecurity:    Worry: Never true    Inability: Never true  . Transportation needs:    Medical: No    Non-medical: No  Tobacco Use  . Smoking status: Never Smoker  . Smokeless tobacco: Never Used  Substance and Sexual Activity  . Alcohol use: Yes    Comment:  10-12 glasses of wine weekly   . Drug use: No  . Sexual  activity: Not Currently  Lifestyle  . Physical activity:    Days per week: 5 days    Minutes per session: 60 min  . Stress: Not at all  Relationships  . Social connections:    Talks on phone: More than three times a week    Gets together: More than three times a week    Attends religious service: More than 4 times per year    Active member of club or organization: Yes    Attends meetings of clubs or organizations: More than 4 times per year    Relationship status: Married  Other Topics Concern  . Not on file  Social History Narrative  . Not on file    Family History  Problem Relation Age of Onset  . Heart disease Father   . Osteoporosis Mother   . Lymphoma Mother        non-hodgkins  . Stroke Neg Hx   . Diabetes Neg Hx   . Colon cancer Neg Hx     Review of  Systems Per HPI    Objective:   Vitals:   10/22/18 1317  BP: (!) 154/82  Pulse: 70  Resp: 16  Temp: 98.1 F (36.7 C)  SpO2: 98%   BP Readings from Last 3 Encounters:  10/22/18 (!) 154/82  04/09/18 140/80  04/08/18 (!) 106/58   Wt Readings from Last 3 Encounters:  10/22/18 130 lb 12.8 oz (59.3 kg)  04/09/18 129 lb (58.5 kg)  04/08/18 127 lb (57.6 kg)   Body mass index is 23.92 kg/m.   Physical Exam    Constitutional: Alert, normal appearance, not ill-appearing Musculoskeletal, bilateral feet: No deformities, normal sensation, pain with palpation plantar surface of heel and medial aspect of heel, increased pain with pressing on lateral and medial aspects of the heel, no other foot pain Skin: No erythema, dry, warm    Assessment & Plan:    See Problem List for Assessment and Plan of chronic medical problems.

## 2019-01-14 DIAGNOSIS — H52223 Regular astigmatism, bilateral: Secondary | ICD-10-CM | POA: Diagnosis not present

## 2019-01-14 DIAGNOSIS — H524 Presbyopia: Secondary | ICD-10-CM | POA: Diagnosis not present

## 2019-01-16 ENCOUNTER — Encounter: Payer: Self-pay | Admitting: Internal Medicine

## 2019-01-17 ENCOUNTER — Encounter: Payer: Self-pay | Admitting: Internal Medicine

## 2019-02-10 DIAGNOSIS — L565 Disseminated superficial actinic porokeratosis (DSAP): Secondary | ICD-10-CM | POA: Diagnosis not present

## 2019-02-10 DIAGNOSIS — Z85820 Personal history of malignant melanoma of skin: Secondary | ICD-10-CM | POA: Diagnosis not present

## 2019-02-10 DIAGNOSIS — L570 Actinic keratosis: Secondary | ICD-10-CM | POA: Diagnosis not present

## 2019-02-10 DIAGNOSIS — D2362 Other benign neoplasm of skin of left upper limb, including shoulder: Secondary | ICD-10-CM | POA: Diagnosis not present

## 2019-02-10 DIAGNOSIS — D485 Neoplasm of uncertain behavior of skin: Secondary | ICD-10-CM | POA: Diagnosis not present

## 2019-02-10 DIAGNOSIS — L820 Inflamed seborrheic keratosis: Secondary | ICD-10-CM | POA: Diagnosis not present

## 2019-02-10 DIAGNOSIS — D225 Melanocytic nevi of trunk: Secondary | ICD-10-CM | POA: Diagnosis not present

## 2019-02-10 DIAGNOSIS — L718 Other rosacea: Secondary | ICD-10-CM | POA: Diagnosis not present

## 2019-02-10 DIAGNOSIS — Z85828 Personal history of other malignant neoplasm of skin: Secondary | ICD-10-CM | POA: Diagnosis not present

## 2019-03-16 ENCOUNTER — Other Ambulatory Visit: Payer: Self-pay | Admitting: Internal Medicine

## 2019-04-12 NOTE — Progress Notes (Signed)
Subjective:    Patient ID: Ruth Jones, female    DOB: [birth year], 73 y.o.   MRN: [id number]  HPI She is here for a physical exam.   Her legs bother her when she is siting and when she wants to go to bed.  She walks daily and they do not bother her then or when she is up and down.  She take magnesium and that has helped.  Her legs ache.  It feels like the blood is running up and down her legs.  It is mostly the lower legs.   They ache, tingle and cramp.  She drinks a good amount of water - could drink more.    She has no other concerns.   Medications and allergies reviewed with patient and updated if appropriate.  Patient Active Problem List   Diagnosis Date Noted  . Leg pain 04/14/2019  . Plantar fasciitis, bilateral 10/22/2018  . Raynaud's phenomenon 04/04/2016  . 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 History of skin cancer 02/02/2015  . 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 Snoring 01/17/2012  . Essential hypertension 08/02/2009  . Osteopenia 08/02/2009  . GERD 02/11/1996  . Diverticulosis of large intestine 02/11/1996    Current Outpatient Medications on File Prior to Visit  Medication Sig Dispense Refill  . amLODipine (NORVASC) 5 MG tablet TAKE 1 TABLET(5 MG) BY MOUTH DAILY 90 tablet 0  . estradiol (ESTRACE) 0.5 MG tablet Take 0.5 mg by mouth daily.    . famotidine-calcium carbonate-magnesium hydroxide (PEPCID COMPLETE) 10-800-165 MG chewable tablet Chew 1 tablet by mouth daily as needed. 60 tablet   . losartan (COZAAR) 25 MG tablet Take 1 tablet (25 mg total) by mouth daily. 90 tablet 3  . Magnesium 100 MG CAPS Take 150 mg by mouth.    . triamcinolone (NASACORT AQ) 55 MCG/ACT AERO nasal inhaler Place 2 sprays into the nose daily. 1 Inhaler 12  . triamterene-hydrochlorothiazide (MAXZIDE-25) 37.5-25 MG per tablet Take 1 tablet by mouth as needed.     . TURMERIC PO Take 500 mg by mouth daily.     No current facility-administered medications on file prior to visit.     Past Medical History:  Diagnosis Date  . Colon polyps   . GERD (gastroesophageal reflux disease)   . Hyperlipidemia   . Osteopenia     Past Surgical History:  Procedure Laterality Date  . COLONOSCOPY W/ POLYPECTOMY    . MOHS SURGERY  2016   melanoma RLE  . UPPER GASTROINTESTINAL ENDOSCOPY  2011   Dr Henrene Pastor  . VESICOVAGINAL FISTULA CLOSURE W/ TAH  1990    Social History   Socioeconomic History  . Marital status: Married    Spouse name: Not on file  . Number of children: 2  . Years of education: Not on file  . Highest education level: Not on file  Occupational History  . Not on file  Social Needs  . Financial resource strain: Not hard at all  . Food insecurity    Worry: Never true    Inability: Never true  . Transportation needs    Medical: No    Non-medical: No  Tobacco Use  . Smoking status: Never Smoker  . Smokeless tobacco: Never Used  Substance and Sexual Activity  . Alcohol use: Yes    Comment:  10-12 glasses of wine weekly   . Drug use: No  . Sexual activity: Not Currently  Lifestyle  . Physical activity    Days  per week: 5 days    Minutes per session: 60 min  . Stress: Not at all  Relationships  . Social connections    Talks on phone: More than three times a week    Gets together: More than three times a week    Attends religious service: More than 4 times per year    Active member of club or organization: Yes    Attends meetings of clubs or organizations: More than 4 times per year    Relationship status: Married  Other Topics Concern  . Not on file  Social History Narrative  . Not on file    Family History  Problem Relation Age of Onset  . Heart disease Father   . Osteoporosis Mother   . Lymphoma Mother        non-hodgkins  . Stroke Neg Hx   . Diabetes Neg Hx   . Colon cancer Neg Hx     Review of Systems  Constitutional: Negative for chills and fever.  Eyes: Negative for visual disturbance.  Respiratory: Negative for cough,  shortness of breath and wheezing.   Cardiovascular: Positive for leg swelling (rare). Negative for chest pain and palpitations.  Gastrointestinal: Negative for abdominal pain, blood in stool, constipation, diarrhea and nausea.       Rare gerd - takes pepcid prn  Genitourinary: Negative for dysuria and hematuria.  Musculoskeletal: Positive for back pain (sometimes). Negative for arthralgias.  Skin: Negative for color change and rash.  Neurological: Negative for dizziness, light-headedness, numbness and headaches.  Psychiatric/Behavioral: Negative for dysphoric mood. The patient is not nervous/anxious.        Objective:   Vitals:   04/14/19 0910  BP: (!) 144/82  Pulse: 60  Resp: 16  Temp: 98.2 F (36.8 C)  SpO2: 97%   Filed Weights   04/14/19 0910  Weight: 129 lb 6.4 oz (58.7 kg)   Body mass index is 23.67 kg/m.  BP Readings from Last 3 Encounters:  04/14/19 (!) 144/82  10/22/18 (!) 154/82  04/09/18 140/80    Wt Readings from Last 3 Encounters:  04/14/19 129 lb 6.4 oz (58.7 kg)  10/22/18 130 lb 12.8 oz (59.3 kg)  04/09/18 129 lb (58.5 kg)     Physical Exam Constitutional: She appears well-developed and well-nourished. No distress.  HENT:  Head: Normocephalic and atraumatic.  Right Ear: External ear normal. Normal ear canal and TM Left Ear: External ear normal.  Normal ear canal and TM Mouth/Throat: Oropharynx is clear and moist.  Eyes: Conjunctivae and EOM are normal.  Neck: Neck supple. No tracheal deviation present. No thyromegaly present. No carotid bruit  Cardiovascular: Normal rate, regular rhythm and normal heart sounds.  No murmur heard.  No edema. Normal pulses in feet/ankles b/l.  Mild superficial veins in b/l legs Pulmonary/Chest: Effort normal and breath sounds normal. No respiratory distress. She has no wheezes. She has no rales.  Breast: deferred   Abdominal: Soft. She exhibits no distension. There is no tenderness.  Lymphadenopathy: She has no  cervical adenopathy.  Skin: Skin is warm and dry. She is not diaphoretic.  Psychiatric: She has a normal mood and affect. Her behavior is normal.        Assessment & Plan:   Physical exam: Screening blood work ordered Immunizations   Flu today , others up to date Colonoscopy    Up to date  Mammogram   Up to date  Gyn   Up to date  Dexa   Up  to date  Eye exams  Up to date Exercise   Walking Weight   Normal BMI Skin    Sees derm, no concerns Substance abuse  none  See Problem List for Assessment and Plan of chronic medical problems.   FU in one year

## 2019-04-12 NOTE — Patient Instructions (Addendum)
Tests ordered today. Your results will be released to MyChart (or called to you) after review.  If any changes need to be made, you will be notified at that same time.  All other Health Maintenance issues reviewed.   All recommended immunizations and age-appropriate screenings are up-to-date or discussed.  No immunization administered today.   Medications reviewed and updated.  Changes include :   none   Please followup in 1 year   Health Maintenance, Female Adopting a healthy lifestyle and getting preventive care are important in promoting health and wellness. Ask your health care provider about:  The right schedule for you to have regular tests and exams.  Things you can do on your own to prevent diseases and keep yourself healthy. What should I know about diet, weight, and exercise? Eat a healthy diet   Eat a diet that includes plenty of vegetables, fruits, low-fat dairy products, and lean protein.  Do not eat a lot of foods that are high in solid fats, added sugars, or sodium. Maintain a healthy weight Body mass index (BMI) is used to identify weight problems. It estimates body fat based on height and weight. Your health care provider can help determine your BMI and help you achieve or maintain a healthy weight. Get regular exercise Get regular exercise. This is one of the most important things you can do for your health. Most adults should:  Exercise for at least 150 minutes each week. The exercise should increase your heart rate and make you sweat (moderate-intensity exercise).  Do strengthening exercises at least twice a week. This is in addition to the moderate-intensity exercise.  Spend less time sitting. Even light physical activity can be beneficial. Watch cholesterol and blood lipids Have your blood tested for lipids and cholesterol at 75 years of age, then have this test every 5 years. Have your cholesterol levels checked more often if:  Your lipid or cholesterol  levels are high.  You are older than 75 years of age.  You are at high risk for heart disease. What should I know about cancer screening? Depending on your health history and family history, you may need to have cancer screening at various ages. This may include screening for:  Breast cancer.  Cervical cancer.  Colorectal cancer.  Skin cancer.  Lung cancer. What should I know about heart disease, diabetes, and high blood pressure? Blood pressure and heart disease  High blood pressure causes heart disease and increases the risk of stroke. This is more likely to develop in people who have high blood pressure readings, are of African descent, or are overweight.  Have your blood pressure checked: ? Every 3-5 years if you are 18-39 years of age. ? Every year if you are 40 years old or older. Diabetes Have regular diabetes screenings. This checks your fasting blood sugar level. Have the screening done:  Once every three years after age 40 if you are at a normal weight and have a low risk for diabetes.  More often and at a younger age if you are overweight or have a high risk for diabetes. What should I know about preventing infection? Hepatitis B If you have a higher risk for hepatitis B, you should be screened for this virus. Talk with your health care provider to find out if you are at risk for hepatitis B infection. Hepatitis C Testing is recommended for:  Everyone born from 1945 through 1965.  Anyone with known risk factors for hepatitis C. Sexually transmitted   infections (STIs)  Get screened for STIs, including gonorrhea and chlamydia, if: ? You are sexually active and are younger than 75 years of age. ? You are older than 75 years of age and your health care provider tells you that you are at risk for this type of infection. ? Your sexual activity has changed since you were last screened, and you are at increased risk for chlamydia or gonorrhea. Ask your health care  provider if you are at risk.  Ask your health care provider about whether you are at high risk for HIV. Your health care provider may recommend a prescription medicine to help prevent HIV infection. If you choose to take medicine to prevent HIV, you should first get tested for HIV. You should then be tested every 3 months for as long as you are taking the medicine. Pregnancy  If you are about to stop having your period (premenopausal) and you may become pregnant, seek counseling before you get pregnant.  Take 400 to 800 micrograms (mcg) of folic acid every day if you become pregnant.  Ask for birth control (contraception) if you want to prevent pregnancy. Osteoporosis and menopause Osteoporosis is a disease in which the bones lose minerals and strength with aging. This can result in bone fractures. If you are 65 years old or older, or if you are at risk for osteoporosis and fractures, ask your health care provider if you should:  Be screened for bone loss.  Take a calcium or vitamin D supplement to lower your risk of fractures.  Be given hormone replacement therapy (HRT) to treat symptoms of menopause. Follow these instructions at home: Lifestyle  Do not use any products that contain nicotine or tobacco, such as cigarettes, e-cigarettes, and chewing tobacco. If you need help quitting, ask your health care provider.  Do not use street drugs.  Do not share needles.  Ask your health care provider for help if you need support or information about quitting drugs. Alcohol use  Do not drink alcohol if: ? Your health care provider tells you not to drink. ? You are pregnant, may be pregnant, or are planning to become pregnant.  If you drink alcohol: ? Limit how much you use to 0-1 drink a day. ? Limit intake if you are breastfeeding.  Be aware of how much alcohol is in your drink. In the [place] one drink equals one 12 oz bottle of beer (355 mL), one 5 oz glass of wine (148 mL), or one 1  oz glass of hard liquor (44 mL). General instructions  Schedule regular health, dental, and eye exams.  Stay current with your vaccines.  Tell your health care provider if: ? You often feel depressed. ? You have ever been abused or do not feel safe at home. Summary  Adopting a healthy lifestyle and getting preventive care are important in promoting health and wellness.  Follow your health care provider's instructions about healthy diet, exercising, and getting tested or screened for diseases.  Follow your health care provider's instructions on monitoring your cholesterol and blood pressure. This information is not intended to replace advice given to you by your health care provider. Make sure you discuss any questions you have with your health care provider. Document Released: 02/20/2011 Document Revised: 07/31/2018 Document Reviewed: 07/31/2018 Elsevier Patient Education  2020 [employer]  

## 2019-04-14 ENCOUNTER — Ambulatory Visit (INDEPENDENT_AMBULATORY_CARE_PROVIDER_SITE_OTHER): Payer: Medicare HMO | Admitting: Internal Medicine

## 2019-04-14 ENCOUNTER — Other Ambulatory Visit (INDEPENDENT_AMBULATORY_CARE_PROVIDER_SITE_OTHER): Payer: Medicare HMO

## 2019-04-14 ENCOUNTER — Encounter: Payer: Self-pay | Admitting: Internal Medicine

## 2019-04-14 ENCOUNTER — Other Ambulatory Visit: Payer: Self-pay

## 2019-04-14 VITALS — BP 144/82 | HR 60 | Temp 98.2°F | Resp 16 | Ht 62.0 in | Wt 129.4 lb

## 2019-04-14 DIAGNOSIS — M79606 Pain in leg, unspecified: Secondary | ICD-10-CM | POA: Insufficient documentation

## 2019-04-14 DIAGNOSIS — Z23 Encounter for immunization: Secondary | ICD-10-CM | POA: Diagnosis not present

## 2019-04-14 DIAGNOSIS — Z0000 Encounter for general adult medical examination without abnormal findings: Secondary | ICD-10-CM

## 2019-04-14 DIAGNOSIS — K219 Gastro-esophageal reflux disease without esophagitis: Secondary | ICD-10-CM | POA: Diagnosis not present

## 2019-04-14 DIAGNOSIS — M8580 Other specified disorders of bone density and structure, unspecified site: Secondary | ICD-10-CM

## 2019-04-14 DIAGNOSIS — M79604 Pain in right leg: Secondary | ICD-10-CM | POA: Diagnosis not present

## 2019-04-14 DIAGNOSIS — H8102 Meniere's disease, left ear: Secondary | ICD-10-CM | POA: Diagnosis not present

## 2019-04-14 DIAGNOSIS — I10 Essential (primary) hypertension: Secondary | ICD-10-CM

## 2019-04-14 DIAGNOSIS — M79605 Pain in left leg: Secondary | ICD-10-CM

## 2019-04-14 HISTORY — DX: Pain in leg, unspecified: M79.606

## 2019-04-14 LAB — CBC WITH DIFFERENTIAL/PLATELET
Basophils Absolute: 0.1 10*3/uL (ref 0.0–0.1)
Basophils Relative: 0.9 % (ref 0.0–3.0)
Eosinophils Absolute: 0.2 10*3/uL (ref 0.0–0.7)
Eosinophils Relative: 3.8 % (ref 0.0–5.0)
HCT: 42.9 % (ref 36.0–46.0)
Hemoglobin: 14.5 g/dL (ref 12.0–15.0)
Lymphocytes Relative: 31.5 % (ref 12.0–46.0)
Lymphs Abs: 2 10*3/uL (ref 0.7–4.0)
MCHC: 33.8 g/dL (ref 30.0–36.0)
MCV: 95.1 fl (ref 78.0–100.0)
Monocytes Absolute: 0.7 10*3/uL (ref 0.1–1.0)
Monocytes Relative: 11.4 % (ref 3.0–12.0)
Neutro Abs: 3.3 10*3/uL (ref 1.4–7.7)
Neutrophils Relative %: 52.4 % (ref 43.0–77.0)
Platelets: 246 10*3/uL (ref 150.0–400.0)
RBC: 4.51 Mil/uL (ref 3.87–5.11)
RDW: 13.6 % (ref 11.5–15.5)
WBC: 6.3 10*3/uL (ref 4.0–10.5)

## 2019-04-14 LAB — COMPREHENSIVE METABOLIC PANEL
ALT: 19 U/L (ref 0–35)
AST: 21 U/L (ref 0–37)
Albumin: 4.4 g/dL (ref 3.5–5.2)
Alkaline Phosphatase: 58 U/L (ref 39–117)
BUN: 15 mg/dL (ref 6–23)
CO2: 28 mEq/L (ref 19–32)
Calcium: 9.4 mg/dL (ref 8.4–10.5)
Chloride: 103 mEq/L (ref 96–112)
Creatinine, Ser: 0.86 mg/dL (ref 0.40–1.20)
GFR: 64.32 mL/min (ref >60.00)
Glucose, Bld: 93 mg/dL (ref 70–99)
Potassium: 4 mEq/L (ref 3.5–5.1)
Sodium: 138 mEq/L (ref 135–145)
Total Bilirubin: 1.2 mg/dL (ref 0.2–1.2)
Total Protein: 7 g/dL (ref 6.0–8.3)

## 2019-04-14 LAB — LIPID PANEL
Cholesterol: 227 mg/dL — ABNORMAL HIGH (ref 0–200)
HDL: 71.7 mg/dL (ref >39.00)
LDL Cholesterol: 130 mg/dL — ABNORMAL HIGH (ref 0–99)
NonHDL: 155.28
Total CHOL/HDL Ratio: 3
Triglycerides: 124 mg/dL (ref 0.0–149.0)
VLDL: 24.8 mg/dL (ref 0.0–40.0)

## 2019-04-14 LAB — MAGNESIUM: Magnesium: 2.2 mg/dL (ref 1.5–2.5)

## 2019-04-14 NOTE — Assessment & Plan Note (Signed)
bp slightly high - she will start monitoring at home Continue current medication at current dose CMP, TSH, CBC

## 2019-04-14 NOTE — Assessment & Plan Note (Signed)
Experiencing leg aching, tingling and cramping primarily at night-mostly calf pain, but sometimes upper leg pain as well ?  Cause She has been taking magnesium and that does help-can continue We will check CMP, magnesium level ?  RLS She is drinking a good amount of fluids and exercise regularly Can try different supplements over-the-counter Continue increase fluids Can try stretching her calves We can try different medications at night if it affects her sleep

## 2019-04-14 NOTE — Assessment & Plan Note (Signed)
Occasional GERD Takes Pepcid Complete as needed only

## 2019-04-14 NOTE — Assessment & Plan Note (Signed)
DEXA up-to-date She is walking regularly She is taking calcium and vitamin D

## 2019-04-14 NOTE — Assessment & Plan Note (Signed)
Taking Maxide as needed only-has not taken in a while Continue as needed

## 2019-04-15 LAB — TSH: TSH: 1.77 u[IU]/mL (ref 0.35–4.50)

## 2019-04-17 ENCOUNTER — Encounter: Payer: Self-pay | Admitting: Internal Medicine

## 2019-05-09 DIAGNOSIS — H8102 Meniere's disease, left ear: Secondary | ICD-10-CM | POA: Diagnosis not present

## 2019-05-09 DIAGNOSIS — H903 Sensorineural hearing loss, bilateral: Secondary | ICD-10-CM | POA: Diagnosis not present

## 2019-05-09 DIAGNOSIS — I10 Essential (primary) hypertension: Secondary | ICD-10-CM | POA: Diagnosis not present

## 2019-05-09 DIAGNOSIS — H938X3 Other specified disorders of ear, bilateral: Secondary | ICD-10-CM | POA: Diagnosis not present

## 2019-05-09 HISTORY — DX: Sensorineural hearing loss, bilateral: H90.3

## 2019-05-26 ENCOUNTER — Other Ambulatory Visit: Payer: Self-pay | Admitting: Internal Medicine

## 2019-06-15 ENCOUNTER — Other Ambulatory Visit: Payer: Self-pay | Admitting: Internal Medicine

## 2019-06-23 ENCOUNTER — Encounter: Payer: Self-pay | Admitting: Internal Medicine

## 2019-06-23 DIAGNOSIS — Z1231 Encounter for screening mammogram for malignant neoplasm of breast: Secondary | ICD-10-CM | POA: Diagnosis not present

## 2019-06-23 DIAGNOSIS — Z01419 Encounter for gynecological examination (general) (routine) without abnormal findings: Secondary | ICD-10-CM | POA: Diagnosis not present

## 2019-06-23 DIAGNOSIS — Z6823 Body mass index (BMI) 23.0-23.9, adult: Secondary | ICD-10-CM | POA: Diagnosis not present

## 2019-06-24 LAB — HM MAMMOGRAPHY

## 2019-07-01 ENCOUNTER — Encounter: Payer: Self-pay | Admitting: Internal Medicine

## 2019-07-26 ENCOUNTER — Other Ambulatory Visit: Payer: Self-pay | Admitting: Internal Medicine

## 2019-07-26 DIAGNOSIS — K219 Gastro-esophageal reflux disease without esophagitis: Secondary | ICD-10-CM

## 2019-07-28 ENCOUNTER — Encounter: Payer: Self-pay | Admitting: Internal Medicine

## 2019-07-28 NOTE — Telephone Encounter (Signed)
Dexilant d/c'd. Ok to Rf?

## 2019-07-29 ENCOUNTER — Other Ambulatory Visit: Payer: Self-pay

## 2019-07-29 DIAGNOSIS — K219 Gastro-esophageal reflux disease without esophagitis: Secondary | ICD-10-CM

## 2019-07-29 MED ORDER — DEXILANT 60 MG PO CPDR: 1.0000 | DELAYED_RELEASE_CAPSULE | ORAL | 1 refills | Status: DC | PRN

## 2019-10-30 DIAGNOSIS — I10 Essential (primary) hypertension: Secondary | ICD-10-CM | POA: Diagnosis not present

## 2019-10-30 DIAGNOSIS — H8102 Meniere's disease, left ear: Secondary | ICD-10-CM | POA: Diagnosis not present

## 2019-10-30 DIAGNOSIS — Z733 Stress, not elsewhere classified: Secondary | ICD-10-CM | POA: Diagnosis not present

## 2019-10-30 DIAGNOSIS — H903 Sensorineural hearing loss, bilateral: Secondary | ICD-10-CM | POA: Diagnosis not present

## 2019-11-18 ENCOUNTER — Encounter: Payer: Self-pay | Admitting: Internal Medicine

## 2020-03-02 DIAGNOSIS — H04123 Dry eye syndrome of bilateral lacrimal glands: Secondary | ICD-10-CM | POA: Diagnosis not present

## 2020-03-02 DIAGNOSIS — H02831 Dermatochalasis of right upper eyelid: Secondary | ICD-10-CM | POA: Diagnosis not present

## 2020-03-02 DIAGNOSIS — H43813 Vitreous degeneration, bilateral: Secondary | ICD-10-CM | POA: Diagnosis not present

## 2020-03-02 DIAGNOSIS — H35373 Puckering of macula, bilateral: Secondary | ICD-10-CM | POA: Diagnosis not present

## 2020-04-14 ENCOUNTER — Encounter: Payer: Medicare HMO | Admitting: Internal Medicine

## 2020-04-29 DIAGNOSIS — H903 Sensorineural hearing loss, bilateral: Secondary | ICD-10-CM | POA: Diagnosis not present

## 2020-04-29 DIAGNOSIS — Z79899 Other long term (current) drug therapy: Secondary | ICD-10-CM | POA: Diagnosis not present

## 2020-04-29 DIAGNOSIS — H8102 Meniere's disease, left ear: Secondary | ICD-10-CM | POA: Diagnosis not present

## 2020-04-29 DIAGNOSIS — Z882 Allergy status to sulfonamides status: Secondary | ICD-10-CM | POA: Diagnosis not present

## 2020-04-29 DIAGNOSIS — Z881 Allergy status to other antibiotic agents status: Secondary | ICD-10-CM | POA: Diagnosis not present

## 2020-04-29 DIAGNOSIS — T451X5A Adverse effect of antineoplastic and immunosuppressive drugs, initial encounter: Secondary | ICD-10-CM | POA: Diagnosis not present

## 2020-04-29 DIAGNOSIS — Z880 Allergy status to penicillin: Secondary | ICD-10-CM | POA: Diagnosis not present

## 2020-05-02 NOTE — Patient Instructions (Addendum)
Blood work was ordered.    All other Health Maintenance issues reviewed.   All recommended immunizations and age-appropriate screenings are up-to-date or discussed.  No immunization administered today.   Medications reviewed and updated.  Changes include :   requip at night for restless leg  Your prescription(s) have been submitted to your pharmacy. Please take as directed and contact our office if you believe you are having problem(s) with the medication(s).  A referral was ordered for neurology.     Someone will call you to schedule an appointment.    Please followup in 1 year    Health Maintenance, Female Adopting a healthy lifestyle and getting preventive care are important in promoting health and wellness. Ask your health care provider about:  The right schedule for you to have regular tests and exams.  Things you can do on your own to prevent diseases and keep yourself healthy. What should I know about diet, weight, and exercise? Eat a healthy diet   Eat a diet that includes plenty of vegetables, fruits, low-fat dairy products, and lean protein.  Do not eat a lot of foods that are high in solid fats, added sugars, or sodium. Maintain a healthy weight Body mass index (BMI) is used to identify weight problems. It estimates body fat based on height and weight. Your health care provider can help determine your BMI and help you achieve or maintain a healthy weight. Get regular exercise Get regular exercise. This is one of the most important things you can do for your health. Most adults should:  Exercise for at least 150 minutes each week. The exercise should increase your heart rate and make you sweat (moderate-intensity exercise).  Do strengthening exercises at least twice a week. This is in addition to the moderate-intensity exercise.  Spend less time sitting. Even light physical activity can be beneficial. Watch cholesterol and blood lipids Have your blood tested for  lipids and cholesterol at 76 years of age, then have this test every 5 years. Have your cholesterol levels checked more often if:  Your lipid or cholesterol levels are high.  You are older than 76 years of age.  You are at high risk for heart disease. What should I know about cancer screening? Depending on your health history and family history, you may need to have cancer screening at various ages. This may include screening for:  Breast cancer.  Cervical cancer.  Colorectal cancer.  Skin cancer.  Lung cancer. What should I know about heart disease, diabetes, and high blood pressure? Blood pressure and heart disease  High blood pressure causes heart disease and increases the risk of stroke. This is more likely to develop in people who have high blood pressure readings, are of African descent, or are overweight.  Have your blood pressure checked: ? Every 3-5 years if you are 68-39 years of age. ? Every year if you are 59 years old or older. Diabetes Have regular diabetes screenings. This checks your fasting blood sugar level. Have the screening done:  Once every three years after age 63 if you are at a normal weight and have a low risk for diabetes.  More often and at a younger age if you are overweight or have a high risk for diabetes. What should I know about preventing infection? Hepatitis B If you have a higher risk for hepatitis B, you should be screened for this virus. Talk with your health care provider to find out if you are at risk for  hepatitis B infection. Hepatitis C Testing is recommended for:  Everyone born from [birth year] through 1967.  Anyone with known risk factors for hepatitis C. Sexually transmitted infections (STIs)  Get screened for STIs, including gonorrhea and chlamydia, if: ? You are sexually active and are younger than 76 years of age. ? You are older than 76 years of age and your health care provider tells you that you are at risk for this type of  infection. ? Your sexual activity has changed since you were last screened, and you are at increased risk for chlamydia or gonorrhea. Ask your health care provider if you are at risk.  Ask your health care provider about whether you are at high risk for HIV. Your health care provider may recommend a prescription medicine to help prevent HIV infection. If you choose to take medicine to prevent HIV, you should first get tested for HIV. You should then be tested every 3 months for as long as you are taking the medicine. Pregnancy  If you are about to stop having your period (premenopausal) and you may become pregnant, seek counseling before you get pregnant.  Take 400 to 800 micrograms (mcg) of folic acid every day if you become pregnant.  Ask for birth control (contraception) if you want to prevent pregnancy. Osteoporosis and menopause Osteoporosis is a disease in which the bones lose minerals and strength with aging. This can result in bone fractures. If you are 72 years old or older, or if you are at risk for osteoporosis and fractures, ask your health care provider if you should:  Be screened for bone loss.  Take a calcium or vitamin D supplement to lower your risk of fractures.  Be given hormone replacement therapy (HRT) to treat symptoms of menopause. Follow these instructions at home: Lifestyle  Do not use any products that contain nicotine or tobacco, such as cigarettes, e-cigarettes, and chewing tobacco. If you need help quitting, ask your health care provider.  Do not use street drugs.  Do not share needles.  Ask your health care provider for help if you need support or information about quitting drugs. Alcohol use  Do not drink alcohol if: ? Your health care provider tells you not to drink. ? You are pregnant, may be pregnant, or are planning to become pregnant.  If you drink alcohol: ? Limit how much you use to 0-1 drink a day. ? Limit intake if you are  breastfeeding.  Be aware of how much alcohol is in your drink. In the [place] one drink equals one 12 oz bottle of beer (355 mL), one 5 oz glass of wine (148 mL), or one 1 oz glass of hard liquor (44 mL). General instructions  Schedule regular health, dental, and eye exams.  Stay current with your vaccines.  Tell your health care provider if: ? You often feel depressed. ? You have ever been abused or do not feel safe at home. Summary  Adopting a healthy lifestyle and getting preventive care are important in promoting health and wellness.  Follow your health care provider's instructions about healthy diet, exercising, and getting tested or screened for diseases.  Follow your health care provider's instructions on monitoring your cholesterol and blood pressure. This information is not intended to replace advice given to you by your health care provider. Make sure you discuss any questions you have with your health care provider. Document Revised: 07/31/2018 Document Reviewed: 07/31/2018 Elsevier Patient Education  2020 [employer].

## 2020-05-02 NOTE — Progress Notes (Signed)
Subjective:    Patient ID: Ruth Jones, female    DOB: 16-Nov-1945, 74 y.o.   MRN: [id number]  HPI She is here for a physical exam.   Her legs drive her crazy during the night.  All night her legs throb or have a constant motion.  If she gets up at night she has to stand there for a few minutes   She has been taking otc meds and has tried a supplement called restful legs and muscle cramp and spasm foam which calms it.  She has also tried nervive.  She used to have no issues with sleep.  She feels it mildly when watching TB.  This started 6-9 months.   She gets cramps 1-2 times a week.  She can relate that to mild dehydration.   when she gets up in the morning. It takes and hour to an hour and a half for the symptoms to completely go away.    She walks daily.  She does aerobics.  Her leg do not bother her then  Medications and allergies reviewed with patient and updated if appropriate.  Patient Active Problem List   Diagnosis Date Noted  . Leg pain 04/14/2019  . Plantar fasciitis, bilateral 10/22/2018  . Raynaud's phenomenon 04/04/2016  . 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 History of skin cancer 02/02/2015  . 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 Snoring 01/17/2012  . Essential hypertension 08/02/2009  . Osteopenia 08/02/2009  . GERD 02/11/1996  . Diverticulosis of large intestine 02/11/1996    Current Outpatient Medications on File Prior to Visit  Medication Sig Dispense Refill  . amLODipine (NORVASC) 5 MG tablet TAKE 1 TABLET(5 MG) BY MOUTH DAILY 90 tablet 3  . dexlansoprazole (DEXILANT) 60 MG capsule Take 1 capsule (60 mg total) by mouth as needed. 30 capsule 1  . estradiol (ESTRACE) 0.5 MG tablet Take 0.5 mg by mouth daily.    . famotidine-calcium carbonate-magnesium hydroxide (PEPCID COMPLETE) 10-800-165 MG chewable tablet Chew 1 tablet by mouth daily as needed. 60 tablet   . losartan (COZAAR) 25 MG tablet TAKE 1 TABLET(25 MG) BY MOUTH DAILY 90 tablet 3  .  Magnesium 100 MG CAPS Take 150 mg by mouth.    . triamcinolone (NASACORT AQ) 55 MCG/ACT AERO nasal inhaler Place 2 sprays into the nose daily. 1 Inhaler 12  . triamterene-hydrochlorothiazide (MAXZIDE-25) 37.5-25 MG per tablet Take 1 tablet by mouth as needed.      No current facility-administered medications on file prior to visit.    Past Medical History:  Diagnosis Date  . Colon polyps   . GERD (gastroesophageal reflux disease)   . Hyperlipidemia   . Osteopenia     Past Surgical History:  Procedure Laterality Date  . COLONOSCOPY W/ POLYPECTOMY    . MOHS SURGERY  2016   melanoma RLE  . UPPER GASTROINTESTINAL ENDOSCOPY  2011   Dr Henrene Pastor  . VESICOVAGINAL FISTULA CLOSURE W/ TAH  1990    Social History   Socioeconomic History  . Marital status: Married    Spouse name: Not on file  . Number of children: 2  . Years of education: Not on file  . Highest education level: Not on file  Occupational History  . Not on file  Tobacco Use  . Smoking status: Never Smoker  . Smokeless tobacco: Never Used  Vaping Use  . Vaping Use: Never used  Substance and Sexual Activity  . Alcohol use: Yes    Comment:  10-12 glasses of wine weekly   . Drug use: No  . Sexual activity: Not Currently  Other Topics Concern  . Not on file  Social History Narrative  . Not on file   Social Determinants of Health   Financial Resource Strain:   . Difficulty of Paying Living Expenses: Not on file  Food Insecurity:   . Worried About Charity fundraiser in the Last Year: Not on file  . Ran Out of Food in the Last Year: Not on file  Transportation Needs:   . Lack of Transportation (Medical): Not on file  . Lack of Transportation (Non-Medical): Not on file  Physical Activity:   . Days of Exercise per Week: Not on file  . Minutes of Exercise per Session: Not on file  Stress:   . Feeling of Stress : Not on file  Social Connections:   . Frequency of Communication with Friends and Family: Not on file    . Frequency of Social Gatherings with Friends and Family: Not on file  . Attends Religious Services: Not on file  . Active Member of Clubs or Organizations: Not on file  . Attends Archivist Meetings: Not on file  . Marital Status: Not on file    Family History  Problem Relation Age of Onset  . Heart disease Father   . Osteoporosis Mother   . Lymphoma Mother        non-hodgkins  . Stroke Neg Hx   . Diabetes Neg Hx   . Colon cancer Neg Hx     Review of Systems  Constitutional: Negative for chills and fever.  Eyes: Negative for visual disturbance.  Respiratory: Negative for cough, shortness of breath and wheezing.   Cardiovascular: Positive for leg swelling (right foot only). Negative for chest pain and palpitations.  Gastrointestinal: Negative for abdominal pain, blood in stool, constipation, diarrhea and nausea.  Genitourinary: Negative for dysuria and hematuria.  Musculoskeletal: Negative for back pain.       No leg pain w walking  Skin: Negative for rash.  Neurological: Positive for headaches (occ). Negative for light-headedness.       RLS like symptoms  Psychiatric/Behavioral: Negative for dysphoric mood. The patient is not nervous/anxious.        Objective:   Vitals:   05/04/20 1414  BP: 118/60  Pulse: 67  Temp: 98.7 F (37.1 C)  SpO2: 95%   Filed Weights   05/04/20 1414  Weight: 129 lb 6.4 oz (58.7 kg)   Body mass index is 25.27 kg/m.  BP Readings from Last 3 Encounters:  05/04/20 118/60  04/14/19 (!) 144/82  10/22/18 (!) 154/82    Wt Readings from Last 3 Encounters:  05/04/20 129 lb 6.4 oz (58.7 kg)  04/14/19 129 lb 6.4 oz (58.7 kg)  10/22/18 130 lb 12.8 oz (59.3 kg)     Physical Exam Constitutional: She appears well-developed and well-nourished. No distress.  HENT:  Head: Normocephalic and atraumatic.  Right Ear: External ear normal. Normal ear canal and TM Left Ear: External ear normal.  Normal ear canal and TM Mouth/Throat:  Oropharynx is clear and moist.  Eyes: Conjunctivae and EOM are normal.  Neck: Neck supple. No tracheal deviation present. No thyromegaly present.  No carotid bruit  Cardiovascular: Normal rate, regular rhythm and normal heart sounds.   No murmur heard.  No edema.  Good DP pulse b/l  Pulmonary/Chest: Effort normal and breath sounds normal. No respiratory distress. She has no wheezes. She has  no rales.  Breast: deferred   Abdominal: Soft. She exhibits no distension. There is no tenderness.  Lymphadenopathy: She has no cervical adenopathy.  Skin: Skin is warm and dry. She is not diaphoretic.  Psychiatric: She has a normal mood and affect. Her behavior is normal.        Assessment & Plan:   Physical exam: Screening blood work    ordered Immunizations  Advised flu vaccine, discussed covid booster Colonoscopy  N/a  Mammogram   Up to date  Gyn  Up to date  Dexa   Due - [facility] - ordered Eye exams  Up to date     Exercise   walking Weight   - normal Substance abuse   none    See Problem List for Assessment and Plan of chronic medical problems.   This visit occurred during the SARS-CoV-2 public health emergency.  Safety protocols were in place, including screening questions prior to the visit, additional usage of staff PPE, and extensive cleaning of exam room while observing appropriate contact time as indicated for disinfecting solutions.

## 2020-05-04 ENCOUNTER — Ambulatory Visit (INDEPENDENT_AMBULATORY_CARE_PROVIDER_SITE_OTHER): Payer: Medicare Other | Admitting: Internal Medicine

## 2020-05-04 ENCOUNTER — Encounter: Payer: Self-pay | Admitting: Internal Medicine

## 2020-05-04 ENCOUNTER — Other Ambulatory Visit: Payer: Self-pay

## 2020-05-04 VITALS — BP 118/60 | HR 67 | Temp 98.7°F | Ht 60.0 in | Wt 129.4 lb

## 2020-05-04 DIAGNOSIS — R252 Cramp and spasm: Secondary | ICD-10-CM

## 2020-05-04 DIAGNOSIS — M8589 Other specified disorders of bone density and structure, multiple sites: Secondary | ICD-10-CM | POA: Diagnosis not present

## 2020-05-04 DIAGNOSIS — G2581 Restless legs syndrome: Secondary | ICD-10-CM

## 2020-05-04 DIAGNOSIS — Z0000 Encounter for general adult medical examination without abnormal findings: Secondary | ICD-10-CM

## 2020-05-04 DIAGNOSIS — K219 Gastro-esophageal reflux disease without esophagitis: Secondary | ICD-10-CM | POA: Diagnosis not present

## 2020-05-04 DIAGNOSIS — I10 Essential (primary) hypertension: Secondary | ICD-10-CM

## 2020-05-04 HISTORY — DX: Restless legs syndrome: G25.81

## 2020-05-04 HISTORY — DX: Cramp and spasm: R25.2

## 2020-05-04 MED ORDER — ROPINIROLE HCL 0.25 MG PO TABS: ORAL_TABLET | ORAL | 5 refills | Status: DC

## 2020-05-04 NOTE — Assessment & Plan Note (Signed)
Chronic dexa due - ordered Walking regularly

## 2020-05-04 NOTE — Assessment & Plan Note (Signed)
Acute Started 6-9 months ago Symptoms c/w RLS No obvious cause Taking otc meds - they do help some Trial of requip  -can titrate Refer to neuro

## 2020-05-04 NOTE — Assessment & Plan Note (Signed)
Chronic occ - 1-2 times a week at most Usually related to dehydration - she tries to remember to drink plenty of fluids Taking magnesium but will check level

## 2020-05-04 NOTE — Assessment & Plan Note (Signed)
Chronic BP well controlled Current regimen effective and well tolerated Continue current medications at current doses cmp  

## 2020-05-04 NOTE — Assessment & Plan Note (Signed)
Chronic GERD controlled Continue daily medication pepcid prn first then dexilant prn

## 2020-05-05 ENCOUNTER — Other Ambulatory Visit: Payer: Medicare Other

## 2020-05-05 DIAGNOSIS — Z0000 Encounter for general adult medical examination without abnormal findings: Secondary | ICD-10-CM | POA: Diagnosis not present

## 2020-05-05 DIAGNOSIS — R252 Cramp and spasm: Secondary | ICD-10-CM

## 2020-05-05 DIAGNOSIS — L718 Other rosacea: Secondary | ICD-10-CM | POA: Diagnosis not present

## 2020-05-05 DIAGNOSIS — I10 Essential (primary) hypertension: Secondary | ICD-10-CM

## 2020-05-05 DIAGNOSIS — L570 Actinic keratosis: Secondary | ICD-10-CM | POA: Diagnosis not present

## 2020-05-05 DIAGNOSIS — Z85820 Personal history of malignant melanoma of skin: Secondary | ICD-10-CM | POA: Diagnosis not present

## 2020-05-05 DIAGNOSIS — Z85828 Personal history of other malignant neoplasm of skin: Secondary | ICD-10-CM | POA: Diagnosis not present

## 2020-05-05 DIAGNOSIS — L578 Other skin changes due to chronic exposure to nonionizing radiation: Secondary | ICD-10-CM | POA: Diagnosis not present

## 2020-05-06 ENCOUNTER — Encounter: Payer: Self-pay | Admitting: Internal Medicine

## 2020-05-06 LAB — COMPREHENSIVE METABOLIC PANEL
AG Ratio: 1.8 (calc) (ref 1.0–2.5)
ALT: 21 U/L (ref 6–29)
AST: 22 U/L (ref 10–35)
Albumin: 3.8 g/dL (ref 3.6–5.1)
Alkaline phosphatase (APISO): 58 U/L (ref 37–153)
BUN/Creatinine Ratio: 18 (calc) (ref 6–22)
BUN: 17 mg/dL (ref 7–25)
CO2: 31 mmol/L (ref 20–32)
Calcium: 9 mg/dL (ref 8.6–10.4)
Chloride: 103 mmol/L (ref 98–110)
Creat: 0.97 mg/dL — ABNORMAL HIGH (ref 0.60–0.93)
Globulin: 2.1 g/dL (calc) (ref 1.9–3.7)
Glucose, Bld: 86 mg/dL (ref 65–99)
Potassium: 4.4 mmol/L (ref 3.5–5.3)
Sodium: 139 mmol/L (ref 135–146)
Total Bilirubin: 0.8 mg/dL (ref 0.2–1.2)
Total Protein: 5.9 g/dL — ABNORMAL LOW (ref 6.1–8.1)

## 2020-05-06 LAB — CBC WITH DIFFERENTIAL/PLATELET
Absolute Monocytes: 505 cells/uL (ref 200–950)
Basophils Absolute: 40 cells/uL (ref 0–200)
Basophils Relative: 0.8 %
Eosinophils Absolute: 260 cells/uL (ref 15–500)
Eosinophils Relative: 5.2 %
HCT: 42.3 % (ref 35.0–45.0)
Hemoglobin: 13.9 g/dL (ref 11.7–15.5)
Lymphs Abs: 1625 cells/uL (ref 850–3900)
MCH: 32.3 pg (ref 27.0–33.0)
MCHC: 32.9 g/dL (ref 32.0–36.0)
MCV: 98.4 fL (ref 80.0–100.0)
MPV: 11.2 fL (ref 7.5–12.5)
Monocytes Relative: 10.1 %
Neutro Abs: 2570 cells/uL (ref 1500–7800)
Neutrophils Relative %: 51.4 %
Platelets: 247 10*3/uL (ref 140–400)
RBC: 4.3 10*6/uL (ref 3.80–5.10)
RDW: 12.3 % (ref 11.0–15.0)
Total Lymphocyte: 32.5 %
WBC: 5 10*3/uL (ref 3.8–10.8)

## 2020-05-06 LAB — LIPID PANEL
Cholesterol: 198 mg/dL (ref <200)
HDL: 67 mg/dL (ref >=50)
LDL Cholesterol (Calc): 108 mg/dL (calc) — ABNORMAL HIGH
Non-HDL Cholesterol (Calc): 131 mg/dL (calc) — ABNORMAL HIGH (ref <130)
Total CHOL/HDL Ratio: 3 (calc) (ref <5.0)
Triglycerides: 122 mg/dL (ref <150)

## 2020-05-06 LAB — TSH: TSH: 1.61 mIU/L (ref 0.40–4.50)

## 2020-05-06 LAB — MAGNESIUM: Magnesium: 2.3 mg/dL (ref 1.5–2.5)

## 2020-05-10 ENCOUNTER — Other Ambulatory Visit: Payer: Medicare Other

## 2020-05-12 ENCOUNTER — Other Ambulatory Visit: Payer: Medicare Other

## 2020-05-13 ENCOUNTER — Encounter: Payer: Self-pay | Admitting: Neurology

## 2020-05-23 ENCOUNTER — Encounter: Payer: Self-pay | Admitting: Internal Medicine

## 2020-05-24 ENCOUNTER — Ambulatory Visit (INDEPENDENT_AMBULATORY_CARE_PROVIDER_SITE_OTHER)
Admission: RE | Admit: 2020-05-24 | Discharge: 2020-05-24 | Disposition: A | Discharge status: Home / Self Care | Payer: Medicare Other | Source: Ambulatory Visit | Attending: Internal Medicine | Admitting: Internal Medicine

## 2020-05-24 ENCOUNTER — Other Ambulatory Visit: Payer: Self-pay

## 2020-05-24 DIAGNOSIS — M8589 Other specified disorders of bone density and structure, multiple sites: Secondary | ICD-10-CM | POA: Diagnosis not present

## 2020-05-25 ENCOUNTER — Other Ambulatory Visit: Payer: Self-pay | Admitting: Internal Medicine

## 2020-05-26 ENCOUNTER — Encounter: Payer: Self-pay | Admitting: Internal Medicine

## 2020-06-07 ENCOUNTER — Encounter: Payer: Self-pay | Admitting: Internal Medicine

## 2020-06-07 NOTE — Progress Notes (Signed)
NEUROLOGY CONSULTATION NOTE  CLEVIE PROUT MRN: [id number] DOB: [birth year]  Referring provider: Billey Gosling, MD Primary care provider: Billey Gosling, MD  Reason for consult:  Restless leg syndrome  HISTORY OF PRESENT ILLNESS: Ruth Jones. [name] is a 74 year old right-handed female with Meniere's disease who presents for Restless Leg Syndrome.  History supplemented by referring provider's note.  A little over a month ago, she developed bilateral leg discomfort.  She describes it as a "electrodes going off in my legs" as well as aching of her legs.  She notices it when in bed.  It would keep her up or wake her up in the middle of the night.  She would need to get out of bed for relief and apply an OTC ointment on her legs.  When she gets out of bed, her legs feel heavy, like they are going to buckle.  She has some non-radiating back pain after strenuous activity but no numbness or paresthesias.  No new medications or change in diet.  Her PCP started her on ropinirole in September, which has helped.  She takes 0.25mg  before bedtime.  She tried 0.50mg  once but didn't make a difference.  However, she still wakes up at 5 AM.  Current treatment:  Ropinirole 0.25mg  one hour prior to bedtime; magnesium 150mg  daily Past treatment:  none  05/05/2020 LABS:  TSH 1.61; Mg 2.3; CMP with Na 139, K 4.4, Cl 103, CO2 31, Ca 9, glucose 86, BUN 17, Cr 0.97, t bili 0.8, ALP 58, AST 22, ALT 21; CBC with WBC 5, Hgb 13.9, HCT 42.3, PLT 247.  PAST MEDICAL HISTORY: Past Medical History:  Diagnosis Date  . Colon polyps   . GERD (gastroesophageal reflux disease)   . Hyperlipidemia   . Osteopenia     PAST SURGICAL HISTORY: Past Surgical History:  Procedure Laterality Date  . COLONOSCOPY W/ POLYPECTOMY    . MOHS SURGERY  2016   melanoma RLE  . UPPER GASTROINTESTINAL ENDOSCOPY  2011   Dr Henrene Pastor  . VESICOVAGINAL FISTULA CLOSURE W/ TAH  1990    MEDICATIONS: Current Outpatient Medications on File Prior to  Visit  Medication Sig Dispense Refill  . amLODipine (NORVASC) 5 MG tablet TAKE 1 TABLET(5 MG) BY MOUTH DAILY 90 tablet 3  . dexlansoprazole (DEXILANT) 60 MG capsule Take 1 capsule (60 mg total) by mouth as needed. 30 capsule 1  . estradiol (ESTRACE) 0.5 MG tablet Take 0.5 mg by mouth daily.    . famotidine-calcium carbonate-magnesium hydroxide (PEPCID COMPLETE) 10-800-165 MG chewable tablet Chew 1 tablet by mouth daily as needed. 60 tablet   . losartan (COZAAR) 25 MG tablet TAKE 1 TABLET(25 MG) BY MOUTH DAILY 90 tablet 3  . Magnesium 100 MG CAPS Take 150 mg by mouth.    Marland Kitchen rOPINIRole (REQUIP) 0.25 MG tablet Take one pill one hour prior to bed.  Increase to 2 pills if needed. 60 tablet 5  . triamcinolone (NASACORT AQ) 55 MCG/ACT AERO nasal inhaler Place 2 sprays into the nose daily. 1 Inhaler 12  . triamterene-hydrochlorothiazide (MAXZIDE-25) 37.5-25 MG per tablet Take 1 tablet by mouth as needed.      No current facility-administered medications on file prior to visit.    ALLERGIES: Allergies  Allergen Reactions  . Levofloxacin     REACTION: GI Intolerance as "horrible stomach pain"  . Metoprolol Succinate     REACTION: Tachycardia  . Penicillins     REACTION: yeast  . Sulfonamide Derivatives  REACTION: itching    FAMILY HISTORY: Family History  Problem Relation Age of Onset  . Heart disease Father   . Osteoporosis Mother   . Lymphoma Mother        non-hodgkins  . Stroke Neg Hx   . Diabetes Neg Hx   . Colon cancer Neg Hx     SOCIAL HISTORY: Social History   Socioeconomic History  . Marital status: Married    Spouse name: Not on file  . Number of children: 2  . Years of education: Not on file  . Highest education level: Not on file  Occupational History  . Not on file  Tobacco Use  . Smoking status: Never Smoker  . Smokeless tobacco: Never Used  Vaping Use  . Vaping Use: Never used  Substance and Sexual Activity  . Alcohol use: Yes    Comment:  10-12 glasses  of wine weekly   . Drug use: No  . Sexual activity: Not Currently  Other Topics Concern  . Not on file  Social History Narrative  . Not on file   Social Determinants of Health   Financial Resource Strain:   . Difficulty of Paying Living Expenses: Not on file  Food Insecurity:   . Worried About Charity fundraiser in the Last Year: Not on file  . Ran Out of Food in the Last Year: Not on file  Transportation Needs:   . Lack of Transportation (Medical): Not on file  . Lack of Transportation (Non-Medical): Not on file  Physical Activity:   . Days of Exercise per Week: Not on file  . Minutes of Exercise per Session: Not on file  Stress:   . Feeling of Stress : Not on file  Social Connections:   . Frequency of Communication with Friends and Family: Not on file  . Frequency of Social Gatherings with Friends and Family: Not on file  . Attends Religious Services: Not on file  . Active Member of Clubs or Organizations: Not on file  . Attends Archivist Meetings: Not on file  . Marital Status: Not on file  Intimate Partner Violence:   . Fear of Current or Ex-Partner: Not on file  . Emotionally Abused: Not on file  . Physically Abused: Not on file  . Sexually Abused: Not on file   PHYSICAL EXAM: Blood pressure (!) 156/81, pulse 81, resp. rate 18, height 5' (1.524 m), weight 130 lb (59 kg), SpO2 97 %. General: No acute distress.  Patient appears well-groomed.   Head:  Normocephalic/atraumatic Eyes:  fundi examined but not visualized Neck: supple, no paraspinal tenderness, full range of motion Back: No paraspinal tenderness Heart: regular rate and rhythm Lungs: Clear to auscultation bilaterally. Vascular: No carotid bruits. Neurological Exam: Mental status: alert and oriented to person, place, and time, recent and remote memory intact, fund of knowledge intact, attention and concentration intact, speech fluent and not dysarthric, language intact. Cranial nerves: CN I: not  tested CN II: pupils equal, round and reactive to light, visual fields intact CN III, IV, VI:  full range of motion, no nystagmus, no ptosis CN V: facial sensation intact CN VII: upper and lower face symmetric CN VIII: hearing intact CN IX, X: gag intact, uvula midline CN XI: sternocleidomastoid and trapezius muscles intact CN XII: tongue midline Bulk & Tone: normal, no fasciculations. Motor:  5/5 throughout  Sensation:  Pinprick and vibration sensation intact.  Deep Tendon Reflexes:  2+ throughout, toes downgoing.   Finger to  nose testing:  Without dysmetria.   Heel to shin:  Without dysmetria.   Gait:  Normal station and stride. Romberg negative.  IMPRESSION: Restless leg syndrome  PLAN: 1.  Advised to increase ropinirole to 0.5mg  one hour before bed and stay on dose.  If no improvement in 2 weeks, we can increase dose to 0.75mg . 2.  Check B12, ferritin, CK 3.  Follow up 4 to 6 months.  Thank you for allowing me to take part in the care of this patient.  Metta Clines, DO  CC: Billey Gosling, MD

## 2020-06-08 ENCOUNTER — Encounter: Payer: Self-pay | Admitting: Neurology

## 2020-06-08 ENCOUNTER — Ambulatory Visit: Payer: Medicare Other | Admitting: Neurology

## 2020-06-08 ENCOUNTER — Other Ambulatory Visit (INDEPENDENT_AMBULATORY_CARE_PROVIDER_SITE_OTHER): Payer: Medicare Other

## 2020-06-08 ENCOUNTER — Other Ambulatory Visit: Payer: Self-pay

## 2020-06-08 VITALS — BP 156/81 | HR 81 | Resp 18 | Ht 60.0 in | Wt 130.0 lb

## 2020-06-08 DIAGNOSIS — G2581 Restless legs syndrome: Secondary | ICD-10-CM

## 2020-06-08 DIAGNOSIS — R252 Cramp and spasm: Secondary | ICD-10-CM

## 2020-06-08 LAB — VITAMIN B12: Vitamin B-12: 285 pg/mL (ref 211–911)

## 2020-06-08 LAB — CK: Total CK: 107 U/L (ref 7–177)

## 2020-06-08 LAB — FERRITIN: Ferritin: 24.5 ng/mL (ref 10.0–291.0)

## 2020-06-08 NOTE — Patient Instructions (Signed)
1.  Continue ropinirole.  Try taking 2 tablets 1 hour before bedtime.  We can increase dose in 2 weeks if needed. 2.  Check B12 and ferritin level 3.  Follow up 4 to 6 months.

## 2020-06-09 ENCOUNTER — Other Ambulatory Visit: Payer: Self-pay

## 2020-06-09 DIAGNOSIS — R252 Cramp and spasm: Secondary | ICD-10-CM

## 2020-07-05 ENCOUNTER — Encounter: Payer: Self-pay | Admitting: Internal Medicine

## 2020-07-26 ENCOUNTER — Other Ambulatory Visit: Payer: Self-pay | Admitting: Internal Medicine

## 2020-08-09 ENCOUNTER — Telehealth: Payer: Self-pay | Admitting: Internal Medicine

## 2020-08-09 NOTE — Progress Notes (Signed)
°  Chronic Care Management   Note  08/09/2020 Name: CLOTINE HEINER MRN: [id number] DOB: 07/06/1946  LARI LINSON is a 74 y.o. year old female who is a primary care patient of Burns, Claudina Lick, MD. I reached out to Franki Cabot by phone today in response to a referral sent by Ms. Roda Shutters Wildes's PCP, Binnie Rail, MD.   Ms. Coscia was given information about Chronic Care Management services today including:  1. CCM service includes personalized support from designated clinical staff supervised by her physician, including individualized plan of care and coordination with other care providers 2. 24/7 contact phone numbers for assistance for urgent and routine care needs. 3. Service will only be billed when office clinical staff spend 20 minutes or more in a month to coordinate care. 4. Only one practitioner may furnish and bill the service in a calendar month. 5. The patient may stop CCM services at any time (effective at the end of the month) by phone call to the office staff.   Patient agreed to services and verbal consent obtained.   Follow up plan:   Carley Perdue [employer] Scheduler

## 2020-09-09 DIAGNOSIS — Z1231 Encounter for screening mammogram for malignant neoplasm of breast: Secondary | ICD-10-CM | POA: Diagnosis not present

## 2020-09-26 DIAGNOSIS — R413 Other amnesia: Secondary | ICD-10-CM | POA: Insufficient documentation

## 2020-09-26 NOTE — Patient Instructions (Addendum)
°  A referral was ordered for Neuropsychologist.       Someone from their office will call you to schedule an appointment.

## 2020-09-26 NOTE — Progress Notes (Signed)
Subjective:    Patient ID: Ruth Jones, female    DOB: [birth year], 75 y.o.   MRN: [id number]  HPI The patient is here for an acute visit.   Memory concerns -  She was in [place] staying with her son and daughter-in-law and helping with the grandkids.  She was getting directions from her daughter-in-law about where to pick up her grandchild and went to be in the carpool line, etc.  Her daughter in law snapped at her and said said she was asking the same questions multiple times and that she should have her memory evaluated.  She feels she wanted to make sure she was doing the right thing and was going to be in the right place when she needed to be there.  She typically asks a lot of questions.  She knows her daughter-in-law is stressed because her breast cancer has recurred.  She has a think she was probably a little stress in the situation.  She does not feel that she has a memory problem, but because her daughter-in-law said this to work she felt the need to have this evaluated further.  She did discuss this with her husband.  Her husband did say she asks questions more than once but so does he.  He did not seem concerned.  She has no difficulty getting where she needs to - no getting lost while driving.  She does all the cooking at home 1 difficulty doing anything around the house.   Medications and allergies reviewed with patient and updated if appropriate.  Patient Active Problem List   Diagnosis Date Noted  . Memory difficulties 09/26/2020  . RLS (restless legs syndrome) 05/04/2020  . Muscle cramping 05/04/2020  . Leg pain 04/14/2019  . Plantar fasciitis, bilateral 10/22/2018  . Raynaud's phenomenon 04/04/2016  . 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 History of skin cancer 02/02/2015  . 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 Snoring 01/17/2012  . Essential hypertension 08/02/2009  . Osteopenia 08/02/2009  . GERD 02/11/1996  . Diverticulosis of large intestine 02/11/1996     Current Outpatient Medications on File Prior to Visit  Medication Sig Dispense Refill  . amLODipine (NORVASC) 5 MG tablet TAKE 1 TABLET(5 MG) BY MOUTH DAILY 90 tablet 3  . doxycycline (ADOXA) 75 MG tablet Take by mouth.    . estradiol (ESTRACE) 0.5 MG tablet Take 0.5 mg by mouth daily.    . famotidine-calcium carbonate-magnesium hydroxide (PEPCID COMPLETE) 10-800-165 MG chewable tablet Chew 1 tablet by mouth daily as needed. 60 tablet   . ferrous sulfate 325 (65 FE) MG tablet Iron (ferrous sulfate) 325 mg (65 mg iron) tablet  Take 1 tablet every day by oral route.    Marland Kitchen losartan (COZAAR) 25 MG tablet TAKE 1 TABLET(25 MG) BY MOUTH DAILY 90 tablet 3  . Magnesium 100 MG CAPS Take 150 mg by mouth.    Marland Kitchen rOPINIRole (REQUIP) 0.25 MG tablet Take one pill one hour prior to bed.  Increase to 2 pills if needed. 60 tablet 5  . triamcinolone (NASACORT AQ) 55 MCG/ACT AERO nasal inhaler Place 2 sprays into the nose daily. 1 Inhaler 12  . triamterene-hydrochlorothiazide (MAXZIDE-25) 37.5-25 MG per tablet Take 1 tablet by mouth as needed.      No current facility-administered medications on file prior to visit.    Past Medical History:  Diagnosis Date  . Colon polyps   . GERD (gastroesophageal reflux disease)   . Hyperlipidemia   . Osteopenia  Past Surgical History:  Procedure Laterality Date  . COLONOSCOPY W/ POLYPECTOMY    . MOHS SURGERY  2016   melanoma RLE  . UPPER GASTROINTESTINAL ENDOSCOPY  2011   Dr Henrene Pastor  . VESICOVAGINAL FISTULA CLOSURE W/ TAH  1990    Social History   Socioeconomic History  . Marital status: Married    Spouse name: Not on file  . Number of children: 2  . Years of education: Not on file  . Highest education level: Not on file  Occupational History  . Not on file  Tobacco Use  . Smoking status: Never Smoker  . Smokeless tobacco: Never Used  Vaping Use  . Vaping Use: Never used  Substance and Sexual Activity  . Alcohol use: Yes    Comment:  10-12  glasses of wine weekly   . Drug use: No  . Sexual activity: Not Currently  Other Topics Concern  . Not on file  Social History Narrative   Right handed   Two story home   Drinks caffeine   Social Determinants of Health   Financial Resource Strain: Not on file  Food Insecurity: Not on file  Transportation Needs: Not on file  Physical Activity: Not on file  Stress: Not on file  Social Connections: Not on file    Family History  Problem Relation Age of Onset  . Heart disease Father   . Osteoporosis Mother   . Lymphoma Mother        non-hodgkins  . Stroke Neg Hx   . Diabetes Neg Hx   . Colon cancer Neg Hx     Review of Systems  Constitutional: Negative for appetite change and fever.  Psychiatric/Behavioral: Negative for dysphoric mood and sleep disturbance. The patient is not nervous/anxious.        Objective:   Vitals:   09/28/20 1438  BP: 112/78  Pulse: 83  Temp: 98 F (36.7 C)  SpO2: 99%   BP Readings from Last 3 Encounters:  09/28/20 112/78  06/08/20 (!) 156/81  05/04/20 118/60   Wt Readings from Last 3 Encounters:  09/28/20 127 lb (57.6 kg)  06/08/20 130 lb (59 kg)  05/04/20 129 lb 6.4 oz (58.7 kg)   Body mass index is 24.8 kg/m.   Physical Exam Constitutional:      General: She is not in acute distress.    Appearance: Normal appearance. She is not ill-appearing.  HENT:     Head: Normocephalic and atraumatic.  Skin:    General: Skin is warm and dry.  Neurological:     Mental Status: She is alert.  Psychiatric:        Mood and Affect: Mood normal.        Behavior: Behavior normal.        Thought Content: Thought content normal.        Judgment: Judgment normal.            Assessment & Plan:    See Problem List for Assessment and Plan of chronic medical problems.    This visit occurred during the SARS-CoV-2 public health emergency.  Safety protocols were in place, including screening questions prior to the visit, additional usage  of staff PPE, and extensive cleaning of exam room while observing appropriate contact time as indicated for disinfecting solutions.

## 2020-09-28 ENCOUNTER — Ambulatory Visit (INDEPENDENT_AMBULATORY_CARE_PROVIDER_SITE_OTHER): Payer: Medicare Other | Admitting: Internal Medicine

## 2020-09-28 ENCOUNTER — Encounter: Payer: Self-pay | Admitting: Internal Medicine

## 2020-09-28 ENCOUNTER — Other Ambulatory Visit: Payer: Self-pay

## 2020-09-28 DIAGNOSIS — R413 Other amnesia: Secondary | ICD-10-CM

## 2020-09-28 MED ORDER — ROPINIROLE HCL 0.25 MG PO TABS
0.7500 mg | ORAL_TABLET | Freq: Every day | ORAL | 5 refills | Status: DC
Start: 2020-09-28 — End: 2021-02-28

## 2020-09-28 NOTE — Assessment & Plan Note (Addendum)
Acute Her daughter in law felt like she needed to be evaluated for memory issues because she was asking questions more than once she personally does not feel like she has a memory issue, but did feel the need to have this evaluated because she said that to her No significant anxiety or depression Exercises regularly No concerning behaviors or getting lost while driving Discussed that it is a good idea to have this further evaluated-we will refer to neuropsychology for further testing

## 2020-09-29 ENCOUNTER — Encounter: Payer: Self-pay | Admitting: Internal Medicine

## 2020-10-04 ENCOUNTER — Telehealth: Payer: Self-pay | Admitting: Pharmacist

## 2020-10-04 NOTE — Progress Notes (Signed)
Chronic Care Management Pharmacy Assistant   Name: Ruth Jones  MRN: [id number] DOB: 02/02/1946  Reason for Encounter: Initial Questions   PCP : Binnie Rail, MD  Allergies:   Allergies  Allergen Reactions  . Levofloxacin     REACTION: GI Intolerance as "horrible stomach pain"  . Metoprolol Succinate     REACTION: Tachycardia  . Penicillins     REACTION: yeast  . Sulfonamide Derivatives     REACTION: itching    Medications: Outpatient Encounter Medications as of 10/04/2020  Medication Sig  . amLODipine (NORVASC) 5 MG tablet TAKE 1 TABLET(5 MG) BY MOUTH DAILY  . doxycycline (ADOXA) 75 MG tablet Take by mouth.  . estradiol (ESTRACE) 0.5 MG tablet Take 0.5 mg by mouth daily.  . famotidine-calcium carbonate-magnesium hydroxide (PEPCID COMPLETE) 10-800-165 MG chewable tablet Chew 1 tablet by mouth daily as needed.  . ferrous sulfate 325 (65 FE) MG tablet Iron (ferrous sulfate) 325 mg (65 mg iron) tablet  Take 1 tablet every day by oral route.  Marland Kitchen losartan (COZAAR) 25 MG tablet TAKE 1 TABLET(25 MG) BY MOUTH DAILY  . Magnesium 100 MG CAPS Take 150 mg by mouth.  Marland Kitchen rOPINIRole (REQUIP) 0.25 MG tablet Take 3 tablets (0.75 mg total) by mouth at bedtime. Take one pill one hour prior to bed.  . triamcinolone (NASACORT AQ) 55 MCG/ACT AERO nasal inhaler Place 2 sprays into the nose daily.  Marland Kitchen triamterene-hydrochlorothiazide (MAXZIDE-25) 37.5-25 MG per tablet Take 1 tablet by mouth as needed.    No facility-administered encounter medications on file as of 10/04/2020.    Current Diagnosis: Patient Active Problem List   Diagnosis Date Noted  . Memory difficulties 09/26/2020  . RLS (restless legs syndrome) 05/04/2020  . Muscle cramping 05/04/2020  . Leg pain 04/14/2019  . Plantar fasciitis, bilateral 10/22/2018  . Raynaud's phenomenon 04/04/2016  . 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 History of skin cancer 02/02/2015  . 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 Snoring 01/17/2012   . Essential hypertension 08/02/2009  . Osteopenia 08/02/2009  . GERD 02/11/1996  . Diverticulosis of large intestine 02/11/1996    Goals Addressed   None     Follow-Up:  Pharmacist Review   Have you seen any other providers since your last visit? The patient states that she has not seen any other provider other than Dr. Quay Burow  Any changes in your medications or health? The patient stated that Dr. Quay Burow changed her requip to once a day  Any side effects from any medications? The patient states that she has some constipation with her iron pill sometimes and also diarrhea.   Do you have an symptoms or problems not managed by your medications? The patient states that she does not have any symptoms or problems not managed by medications  Any concerns about your health right now? The patient states that she does have some memory loss which is why she saw Dr. Quay Burow but does not think it is that big of a problem. Her daughter in law was more concerned  Has your provider asked that you check blood pressure, blood sugar, or follow special diet at home? The patient states that she does not check her blood pressure   Do you get any type of exercise on a regular basis? The patient states that she walks, does yoga, very active  Can you think of a goal you would like to reach for your health? The patient does not have a goal at  this time  Do you have any problems getting your medications? The patient states that she does not have a problem with getting medications from pharmacy  Is there anything that you would like to discuss during the appointment? The patient does not have anything specific to discuss at appointment  Please bring medications and supplements to appointment   Wendy Poet, [place] [phone]   Total:40

## 2020-10-06 ENCOUNTER — Other Ambulatory Visit: Payer: Self-pay

## 2020-10-06 ENCOUNTER — Ambulatory Visit (INDEPENDENT_AMBULATORY_CARE_PROVIDER_SITE_OTHER): Payer: Medicare Other | Admitting: Pharmacist

## 2020-10-06 DIAGNOSIS — I7300 Raynaud's syndrome without gangrene: Secondary | ICD-10-CM

## 2020-10-06 DIAGNOSIS — M8589 Other specified disorders of bone density and structure, multiple sites: Secondary | ICD-10-CM

## 2020-10-06 DIAGNOSIS — I10 Essential (primary) hypertension: Secondary | ICD-10-CM

## 2020-10-06 DIAGNOSIS — G2581 Restless legs syndrome: Secondary | ICD-10-CM

## 2020-10-06 DIAGNOSIS — K219 Gastro-esophageal reflux disease without esophagitis: Secondary | ICD-10-CM

## 2020-10-06 NOTE — Progress Notes (Signed)
Chronic Care Management Pharmacy Note  10/07/2020 Name:  Ruth Jones MRN:  [id number] DOB:  1945/11/17  Subjective: Ruth Jones is an 75 y.o. year old female who is a primary patient of Burns, Claudina Lick, MD.  The CCM team was consulted for assistance with disease management and care coordination needs.    Engaged with patient by telephone for initial visit in response to provider referral for pharmacy case management and/or care coordination services.   Consent to Services:  The patient was given the following information about Chronic Care Management services today, agreed to services, and gave verbal consent: 1. CCM service includes personalized support from designated clinical staff supervised by the primary care provider, including individualized plan of care and coordination with other care providers 2. 24/7 contact phone numbers for assistance for urgent and routine care needs. 3. Service will only be billed when office clinical staff spend 20 minutes or more in a month to coordinate care. 4. Only one practitioner may furnish and bill the service in a calendar month. 5.The patient may stop CCM services at any time (effective at the end of the month) by phone call to the office staff. 6. The patient will be responsible for cost sharing (co-pay) of up to 20% of the service fee (after annual deductible is met). Patient agreed to services and consent obtained.  Patient Care Team: Binnie Rail, MD as PCP - General (Internal Medicine) Charlton Haws, [facility] as Pharmacist (Pharmacist)  Recent office visits: 09/28/20 Dr Quay Burow OV: acute visit for memory concerns. Referred to neuropsych for further testing  05/04/20 Dr Quay Burow OV:  Chronic f/u, c/o RLS. Rx'd ropinirole and referred to neurology.  Recent consult visits: 09/09/20 Dr Stann Mainland (OB/GYN): f/u menopausal syndrome.  06/08/20 Dr Tomi Likens (neurology): initial consult for RLS. Advised to increase ropinirole to 0.5 mg 1 hr before bed. If no  improvement in 2 weeks, increase to 0.75 mg. Ferritin and B12 were low-normal, so advised OTC B12 and iron supplements daily  Hospital visits: None in previous 6 months  Objective:  Lab Results  Component Value Date   CREATININE 0.97 (H) 05/05/2020   BUN 17 05/05/2020   GFR 64.32 04/14/2019   NA 139 05/05/2020   K 4.4 05/05/2020   CALCIUM 9.0 05/05/2020   CO2 31 05/05/2020    Lab Results  Component Value Date/Time   GFR 64.32 04/14/2019 10:03 AM   GFR 59.66 (L) 04/10/2018 10:42 AM    Last diabetic Eye exam: No results found for: HMDIABEYEEXA  Last diabetic Foot exam: No results found for: HMDIABFOOTEX   Lab Results  Component Value Date   CHOL 198 05/05/2020   HDL 67 05/05/2020   LDLCALC 108 (H) 05/05/2020   LDLDIRECT 124.8 10/09/2011   TRIG 122 05/05/2020   CHOLHDL 3.0 05/05/2020    Hepatic Function Latest Ref Rng & Units 05/05/2020 04/14/2019 04/10/2018  Total Protein 6.1 - 8.1 g/dL 5.9(L) 7.0 6.8  Albumin 3.5 - 5.2 g/dL - 4.4 4.0  AST 10 - 35 U/L $Remo'22 21 23  'SkVju$ ALT 6 - 29 U/L $Remo'21 19 19  'rHJww$ Alk Phosphatase 39 - 117 U/L - 58 54  Total Bilirubin 0.2 - 1.2 mg/dL 0.8 1.2 0.9  Bilirubin, Direct 0.0 - 0.3 mg/dL - - -    Lab Results  Component Value Date/Time   TSH 1.61 05/05/2020 09:04 AM   TSH 1.77 04/14/2019 10:03 AM    CBC Latest Ref Rng & Units 05/05/2020 04/14/2019 04/10/2018  WBC  3.8 - 10.8 Thousand/uL 5.0 6.3 5.0  Hemoglobin 11.7 - 15.5 g/dL 45.3 64.6 80.3  Hematocrit 35.0 - 45.0 % 42.3 42.9 42.1  Platelets 140 - 400 Thousand/uL 247 246.0 263.0   Iron/TIBC/Ferritin/ %Sat    Component Value Date/Time   FERRITIN 24.5 06/08/2020 0954    Lab Results  Component Value Date/Time   VD25OH 32.56 08/20/2015 01:59 PM   VD25OH 37.62 02/02/2015 12:17 PM   Clinical ASCVD: No  The 10-year ASCVD risk score Denman George DC Jr., et al., 2013) is: 18.5%   Values used to calculate the score:     Age: 17 years     Sex: Female     Is Non-Hispanic African American: No     Diabetic:  No     Tobacco smoker: No     Systolic Blood Pressure: 112 mmHg     Is BP treated: Yes     HDL Cholesterol: 67 mg/dL     Total Cholesterol: 198 mg/dL    Depression screen [facility] 2/9 05/04/2020 04/14/2019 04/08/2018  Decreased Interest 0 1 0  Down, Depressed, Hopeless 0 0 0  PHQ - 2 Score 0 1 0  Altered sleeping - 1 -  Tired, decreased energy - 0 -  Change in appetite - 0 -  Feeling bad or failure about yourself  - 0 -  Trouble concentrating - 0 -  Moving slowly or fidgety/restless - 0 -  Suicidal thoughts - 0 -  PHQ-9 Score - 2 -     Social History   Tobacco Use  Smoking Status Never Smoker  Smokeless Tobacco Never Used   BP Readings from Last 3 Encounters:  09/28/20 112/78  06/08/20 (!) 156/81  05/04/20 118/60   Pulse Readings from Last 3 Encounters:  09/28/20 83  06/08/20 81  05/04/20 67   Wt Readings from Last 3 Encounters:  09/28/20 127 lb (57.6 kg)  06/08/20 130 lb (59 kg)  05/04/20 129 lb 6.4 oz (58.7 kg)    Assessment/Interventions: Review of patient past medical history, allergies, medications, health status, including review of consultants reports, laboratory and other test data, was performed as part of comprehensive evaluation and provision of chronic care management services.   SDOH:  (Social Determinants of Health) assessments and interventions performed: Yes SDOH Interventions   Flowsheet Row Most Recent Value  SDOH Interventions   Financial Strain Interventions Intervention Not Indicated      CCM Care Plan  Allergies  Allergen Reactions  . Levofloxacin     REACTION: GI Intolerance as "horrible stomach pain"  . Metoprolol Succinate     REACTION: Tachycardia  . Penicillins     REACTION: yeast  . Sulfonamide Derivatives     REACTION: itching    Medications Reviewed Today    Reviewed by Kathyrn Sheriff, [facility] (Pharmacist) on 10/06/20 at 1426  Med List Status: <None>  Medication Order Taking? Sig Documenting Provider Last Dose Status  Informant  amLODipine (NORVASC) 5 MG tablet [id number] Yes TAKE 1 TABLET(5 MG) BY MOUTH DAILY  Patient taking differently: Take 5 mg by mouth in the morning and at bedtime.   Pincus Sanes, MD Taking Active   Calcium-Magnesium-Vitamin D (CALCIUM MAGNESIUM PO) [id number] Yes Take by mouth. [provider] Taking Active   estradiol (ESTRACE) 0.5 MG tablet [id number] Yes Take 0.5 mg by mouth daily. [provider] Taking Active   famotidine-calcium carbonate-magnesium hydroxide (PEPCID COMPLETE) 10-800-165 MG chewable tablet [id number] Yes Chew 1 tablet by mouth daily as needed.  Binnie Rail, MD Taking Active   ferrous sulfate 325 (65 FE) MG tablet [id number] Yes Take 325 mg by mouth daily. Take 1/2 daily [provider] Taking Active   fluticasone (FLONASE) 50 MCG/ACT nasal spray [id number] Yes Place 2 sprays into both nostrils at bedtime. [provider] Taking Active   losartan (COZAAR) 25 MG tablet [id number] Yes TAKE 1 TABLET(25 MG) BY MOUTH DAILY Burns, Claudina Lick, MD Taking Active   rOPINIRole (REQUIP) 0.25 MG tablet [id number] Yes Take 3 tablets (0.75 mg total) by mouth at bedtime. Take one pill one hour prior to bed. Binnie Rail, MD Taking Active   vitamin B-12 (CYANOCOBALAMIN) 1000 MCG tablet [id number] Yes Take 1,000 mcg by mouth daily. [provider] Taking Active           Patient Active Problem List   Diagnosis Date Noted  . Memory difficulties 09/26/2020  . RLS (restless legs syndrome) 05/04/2020  . Muscle cramping 05/04/2020  . Leg pain 04/14/2019  . Plantar fasciitis, bilateral 10/22/2018  . Raynaud's phenomenon 04/04/2016  . 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 History of skin cancer 02/02/2015  . 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 Snoring 01/17/2012  . Essential hypertension 08/02/2009  . Osteopenia 08/02/2009  . GERD 02/11/1996  . Diverticulosis of large intestine 02/11/1996    Immunization History  Administered Date(s)  Administered  . Fluad Quad(high Dose 65+) 04/14/2019  . Influenza Whole 06/22/2011  . Influenza, High Dose Seasonal PF 06/26/2013, 06/24/2015, 06/06/2016, 05/09/2018  . Influenza,inj,Quad PF,6+ Mos 04/30/2014  . Influenza-Unspecified 06/10/2017, 04/13/2018, 05/24/2020  . Moderna Sars-Covid-2 Vaccination 09/03/2019, 10/01/2019, 06/28/2020  . Pneumococcal Conjugate-13 07/28/2015  . Pneumococcal Polysaccharide-23 08/02/2009  . Td 08/22/2003  . Tdap 02/02/2015  . Zoster 11/18/2007  . Zoster Recombinat (Shingrix) 01/14/2017, 06/27/2017    Conditions to be addressed/monitored:  Hypertension, GERD, Osteopenia and Postmenopausal syndrome, RLS  Care Plan : [place]  Updates made by Charlton Haws, [place] since 10/07/2020 12:00 AM    Problem: Hypertension, GERD, Osteopenia and Postmenopausal syndrome, RLS   Priority: High    Long-Range Goal: Disease Management   Start Date: 10/07/2020  Expected End Date: 10/07/2021  This Visit's Progress: On track  Priority: High  Note:   Current Barriers:  . Unable to independently monitor therapeutic efficacy  Pharmacist Clinical Goal(s):  Marland Kitchen Over the next 365 days, patient will achieve adherence to monitoring guidelines and medication adherence to achieve therapeutic efficacy through collaboration with PharmD and provider.   Interventions: . 1:1 collaboration with Binnie Rail, MD regarding development and update of comprehensive plan of care as evidenced by provider attestation and co-signature . Inter-disciplinary care team collaboration (see longitudinal plan of care) . Comprehensive medication review performed; medication list updated in electronic medical record  Hypertension (BP goal <130/80) -controlled  -Hx Raynaud's and Meniere's -Current treatment: . Amlodipine 5 mg daily BID . Losartan 25 mg daily -Current home readings: not checking due to not having a monitor. -Denies hypotensive/hypertensive symptoms -Educated on  BP goals and benefits of medications for prevention of heart attack, stroke and kidney damage; Daily salt intake goal < 2300 mg; Benefits of BP medications -Counseled to monitor BP at home as needed; recommend Omron monitor for cost effectiveness -Recommended to continue current medication  Osteopenia (Goal: prevent fractures) -controlled -Last DEXA Scan: 05/24/2020   T-Score femoral neck: -1.4  T-Score lumbar spine: +1.1  10-year probability of major osteoporotic fracture: 11.8%  10-year probability of hip fracture: 2.4% -  Patient is not a candidate for pharmacologic treatment -Current treatment  . Calcium-magnesium-vitamin D -Recommend [phone] units of vitamin D daily. Recommend 1200 mg of calcium daily from dietary and supplemental sources.  Postmenopausal sx (Goal: manage symptoms) -controlled - per patient report -Current treatment  . Estradiol 1 mg daily -Patient is satisfied with current regimen and denies issues -Recommended to continue current medication  GERD (Goal: manage symptoms) -controlled - rarely needs to use medcation -Current treatment  . Pepcid Complete (famotidine-Ca-Mg) PRN -Recommended to continue current medication  RLS (Goal: manage symptoms) -controlled - increasing to 3 tabs has helped -Current treatment  . Ropinirole 0.25 mg - 3 tab 1 hr before HS -Patient is satisfied with current regimen and denies issues -Recommended to continue current medication  Health Maintenance -Vaccine gaps: none -Current therapy:  . Fluticasone nasal spray . Ferrous sulfate 325 mg daily - 1/2 tab daily . Vitamin B12 1000 mcg daily -Patient is satisfied with current therapy and denies issues -Counseled on diet and exercise extensively Recommended to continue current medication  Patient Goals/Self-Care Activities . Over the next 365 days, patient will:  - take medications as prescribed focus on medication adherence by pill box  Follow Up Plan: Telephone follow  up appointment with care management team member scheduled for: 1 year      Medication Assistance: None required.  Patient affirms current coverage meets needs.  Patient's preferred pharmacy is:  [id number] - [address] [place] [address] Phone: [phone] Fax: [phone]  Uses pill box? Yes Pt endorses 100% compliance  We discussed: Current pharmacy is preferred with insurance plan and patient is satisfied with pharmacy services Patient decided to: Continue current medication management strategy  Care Plan and Follow Up Patient Decision:  Patient agrees to Care Plan and Follow-up.  Plan: Telephone follow up appointment with care management team member scheduled for:  1 year  Charlene Brooke, PharmD, [facility] Clinical Pharmacist [facility] [phone]

## 2020-10-07 NOTE — Patient Instructions (Addendum)
Visit Information  Phone number for Pharmacist: [phone]  Thank you for meeting with me to discuss your medications! I look forward to working with you to achieve your health care goals. Below is a summary of what we talked about during the visit:  Goals Addressed            This Visit's Progress   . Manage My Medicine       Timeframe:  Long-Range Goal Priority:  Medium Start Date:  10/07/20                           Expected End Date:    10/07/21                   Follow Up Date 04/20/21   - call for medicine refill 2 or 3 days before it runs out - call if I am sick and can't take my medicine - keep a list of all the medicines I take; vitamins and herbals too - use a pillbox to sort medicine    Why is this important?   . These steps will help you keep on track with your medicines.   Notes:       Patient Care Plan: CCM Pharmacy Care Plan    Problem Identified: Hypertension, GERD, Osteopenia and Postmenopausal syndrome, RLS   Priority: High    Long-Range Goal: Disease Management   Start Date: 10/07/2020  Expected End Date: 10/07/2021  This Visit's Progress: On track  Priority: High  Note:   Current Barriers:  . Unable to independently monitor therapeutic efficacy  Pharmacist Clinical Goal(s):  Marland Kitchen Over the next 365 days, patient will achieve adherence to monitoring guidelines and medication adherence to achieve therapeutic efficacy through collaboration with PharmD and provider.   Interventions: . 1:1 collaboration with Binnie Rail, MD regarding development and update of comprehensive plan of care as evidenced by provider attestation and co-signature . Inter-disciplinary care team collaboration (see longitudinal plan of care) . Comprehensive medication review performed; medication list updated in electronic medical record  Hypertension (BP goal <130/80) -controlled  -Hx Raynaud's and Meniere's -Current treatment: . Amlodipine 5 mg daily BID . Losartan 25 mg  daily -Current home readings: not checking due to not having a monitor. -Denies hypotensive/hypertensive symptoms -Educated on BP goals and benefits of medications for prevention of heart attack, stroke and kidney damage; Daily salt intake goal < 2300 mg; Benefits of BP medications -Counseled to monitor BP at home as needed; recommend Omron monitor for cost effectiveness -Recommended to continue current medication  Osteopenia (Goal: prevent fractures) -controlled -Last DEXA Scan: 05/24/2020   T-Score femoral neck: -1.4  T-Score lumbar spine: +1.1  10-year probability of major osteoporotic fracture: 11.8%  10-year probability of hip fracture: 2.4% -Patient is not a candidate for pharmacologic treatment -Current treatment  . Calcium-magnesium-vitamin D -Recommend [phone] units of vitamin D daily. Recommend 1200 mg of calcium daily from dietary and supplemental sources.  Postmenopausal sx (Goal: manage symptoms) -controlled - per patient report -Current treatment  . Estradiol 1 mg daily -Patient is satisfied with current regimen and denies issues -Recommended to continue current medication  GERD (Goal: manage symptoms) -controlled - rarely needs to use medcation -Current treatment  . Pepcid Complete (famotidine-Ca-Mg) PRN -Recommended to continue current medication  RLS (Goal: manage symptoms) -controlled - increasing to 3 tabs has helped -Current treatment  . Ropinirole 0.25 mg - 3 tab 1 hr before HS -Patient is  satisfied with current regimen and denies issues -Recommended to continue current medication  Health Maintenance -Vaccine gaps: none -Current therapy:  . Fluticasone nasal spray . Ferrous sulfate 325 mg daily - 1/2 tab daily . Vitamin B12 1000 mcg daily -Patient is satisfied with current therapy and denies issues -Counseled on diet and exercise extensively Recommended to continue current medication  Patient Goals/Self-Care Activities . Over the next 365 days,  patient will:  - take medications as prescribed focus on medication adherence by pill box  Follow Up Plan: Telephone follow up appointment with care management team member scheduled for: 1 year      Ms. Ekblad was given information about Chronic Care Management services today including:  1. CCM service includes personalized support from designated clinical staff supervised by her physician, including individualized plan of care and coordination with other care providers 2. 24/7 contact phone numbers for assistance for urgent and routine care needs. 3. Standard insurance, coinsurance, copays and deductibles apply for chronic care management only during months in which we provide at least 20 minutes of these services. Most insurances cover these services at 100%, however patients may be responsible for any copay, coinsurance and/or deductible if applicable. This service may help you avoid the need for more expensive face-to-face services. 4. Only one practitioner may furnish and bill the service in a calendar month. 5. The patient may stop CCM services at any time (effective at the end of the month) by phone call to the office staff.  Patient agreed to services and verbal consent obtained.   The patient verbalized understanding of instructions, educational materials, and care plan provided today and agreed to receive a mailed copy of patient instructions, educational materials, and care plan.  Telephone follow up appointment with pharmacy team member scheduled for: 1 year  Charlene Brooke, PharmD, BCACP Clinical Pharmacist [facility] protect organs, store calcium, anchor muscles, and support the whole body. Keeping your bones strong is important, especially as you get older. You can take actions to help keep your bones strong and healthy. Why is keeping my bones healthy important? Keeping your bones healthy is important because  your body constantly replaces bone cells. Cells get old, and new cells take their place. As we age, we lose bone cells because the body may not be able to make enough new cells to replace the old cells. The amount of bone cells and bone tissue you have is referred to as bone mass. The higher your bone mass, the stronger your bones. The aging process leads to an overall loss of bone mass in the body, which can increase the likelihood of:  Joint pain and stiffness.  Broken bones.  A condition in which the bones become weak and brittle (osteoporosis). A large decline in bone mass occurs in older adults. In women, it occurs about the time of menopause.   What actions can I take to keep my bones healthy? Good health habits are important for maintaining healthy bones. This includes eating nutritious foods and exercising regularly. To have healthy bones, you need to get enough of the right minerals and vitamins. Most nutrition experts recommend getting these nutrients from the foods that you eat. In some cases, taking supplements may also be recommended. Doing certain types of exercise is also important for bone health. What are the nutritional recommendations for healthy bones? Eating a well-balanced diet with plenty of calcium and vitamin D will help to protect your  bones. Nutritional recommendations vary from person to person. Ask your health care provider what is healthy for you. Here are some general guidelines. Get enough calcium Calcium is the most important (essential) mineral for bone health. Most people can get enough calcium from their diet, but supplements may be recommended for people who are at risk for osteoporosis. Good sources of calcium include:  Dairy products, such as low-fat or nonfat milk, cheese, and yogurt.  Dark green leafy vegetables, such as bok choy and broccoli.  Calcium-fortified foods, such as orange juice, cereal, bread, soy beverages, and tofu products.  Nuts, such as  almonds. Follow these recommended amounts for daily calcium intake:  Children, age 58-3: 700 mg.  Children, age 66-8: 1,000 mg.  Children, age 30-13: 1,300 mg.  Teens, age 582-18: 1,300 mg.  Adults, age 666-50: 1,000 mg.  Adults, age 581-70: ? Men: 1,000 mg. ? Women: 1,200 mg.  Adults, age 661 or older: 1,200 mg.  Pregnant and breastfeeding females: ? Teens: 1,300 mg. ? Adults: 1,000 mg. Get enough vitamin D Vitamin D is the most essential vitamin for bone health. It helps the body absorb calcium. Sunlight stimulates the skin to make vitamin D, so be sure to get enough sunlight. If you live in a cold climate or you do not get outside often, your health care provider may recommend that you take vitamin D supplements. Good sources of vitamin D in your diet include:  Egg yolks.  Saltwater fish.  Milk and cereal fortified with vitamin D. Follow these recommended amounts for daily vitamin D intake:  Children and teens, age 58-18: 600 international units.  Adults, age 74 or younger: 400-800 international units.  Adults, age 66 or older: 800-1,000 international units. Get other important nutrients Other nutrients that are important for bone health include:  Phosphorus. This mineral is found in meat, poultry, dairy foods, nuts, and legumes. The recommended daily intake for adult men and adult women is 700 mg.  Magnesium. This mineral is found in seeds, nuts, dark green vegetables, and legumes. The recommended daily intake for adult men is 400-420 mg. For adult women, it is 310-320 mg.  Vitamin K. This vitamin is found in green leafy vegetables. The recommended daily intake is 120 mg for adult men and 90 mg for adult women.   What type of physical activity is best for building and maintaining healthy bones? Weight-bearing and strength-building activities are important for building and maintaining healthy bones. Weight-bearing activities cause muscles and bones to work against gravity.  Strength-building activities increase the strength of the muscles that support bones. Weight-bearing and muscle-building activities include:  Walking and hiking.  Jogging and running.  Dancing.  Gym exercises.  Lifting weights.  Tennis and racquetball.  Climbing stairs.  Aerobics. Adults should get at least 30 minutes of moderate physical activity on most days. Children should get at least 60 minutes of moderate physical activity on most days. Ask your health care provider what type of exercise is best for you.   How can I find out if my bone mass is low? Bone mass can be measured with an X-ray test called a bone mineral density (BMD) test. This test is recommended for all women who are age 588 or older. It may also be recommended for:  Men who are age 33 or older.  People who are at risk for osteoporosis because of: ? Having bones that break easily. ? Having a long-term disease that weakens bones, such as kidney disease or  rheumatoid arthritis. ? Having menopause earlier than normal. ? Taking medicine that weakens bones, such as steroids, thyroid hormones, or hormone treatment for breast cancer or prostate cancer. ? Smoking. ? Drinking three or more alcoholic drinks a day. If you find that you have a low bone mass, you may be able to prevent osteoporosis or further bone loss by changing your diet and lifestyle. Where can I find more information? For more information, check out the following websites:  [place]: AviationTales.fr  [employer] of Health: www.bones.SouthExposed.es  International Osteoporosis Foundation: Administrator.iofbonehealth.org Summary  The aging process leads to an overall loss of bone mass in the body, which can increase the likelihood of broken bones and osteoporosis.  Eating a well-balanced diet with plenty of calcium and vitamin D will help to protect your bones.  Weight-bearing and strength-building activities are also important  for building and maintaining strong bones.  Bone mass can be measured with an X-ray test called a bone mineral density (BMD) test. This information is not intended to replace advice given to you by your health care provider. Make sure you discuss any questions you have with your health care provider. Document Revised: 09/03/2017 Document Reviewed: 09/03/2017 Elsevier Patient Education  2021 [employer].

## 2020-10-13 ENCOUNTER — Telehealth: Payer: Self-pay | Admitting: Internal Medicine

## 2020-10-13 NOTE — Telephone Encounter (Signed)
Yes she should have one.  Screening usually stops between 75-80 yrs ago - since she is very healthy and it has been 10 years - GI will want to do one more.......sorry but she should have one

## 2020-10-13 NOTE — Telephone Encounter (Signed)
Patient called and said she got a letter in the mail that it was time for a colonoscopy, she said it had been 10 years since her  last and she was wondering if Dr. Quay Burow thought she should get another one. Please advise. She can be reached at [phone]

## 2020-10-14 ENCOUNTER — Other Ambulatory Visit: Payer: Self-pay

## 2020-10-14 DIAGNOSIS — I10 Essential (primary) hypertension: Secondary | ICD-10-CM

## 2020-10-14 NOTE — Telephone Encounter (Signed)
Called and spoke with patient today.

## 2020-12-13 ENCOUNTER — Other Ambulatory Visit (INDEPENDENT_AMBULATORY_CARE_PROVIDER_SITE_OTHER): Payer: Medicare Other

## 2020-12-13 DIAGNOSIS — R252 Cramp and spasm: Secondary | ICD-10-CM

## 2020-12-13 LAB — FERRITIN: Ferritin: 17.7 ng/mL (ref 10.0–291.0)

## 2020-12-13 LAB — VITAMIN B12: Vitamin B-12: 621 pg/mL (ref 211–911)

## 2020-12-13 NOTE — Progress Notes (Deleted)
Virtual Visit via Video Note The purpose of this virtual visit is to provide medical care while limiting exposure to the novel coronavirus.    Consent was obtained for video visit:  yes Answered questions that patient had about telehealth interaction:  yes I discussed the limitations, risks, security and privacy concerns of performing an evaluation and management service by telemedicine. I also discussed with the patient that there may be a patient responsible charge related to this service. The patient expressed understanding and agreed to proceed.  Pt location: Home Physician Location: office Name of referring provider:  Binnie Rail, MD I connected with Ruth Jones at patients initiation/request on 12/14/2020 at  1:30 PM EDT by video enabled telemedicine application and verified that I am speaking with the correct person using two identifiers. Pt MRN:  [id number] Pt DOB:  1945-11-22 Video Participants:  Ruth Jones  Assessment and Plan:   1.  Restless leg syndrome.  *** 2.  Memory difficulties.    1.  Start ferrous sulfate 325mg  daily.  Repeat ferritin level in 6 months. 2.  Check MRI of brain without contrast 3.  Refer for neuropsychological evaluation 4.  ***  History of Present Illness:  Ruth Jones is a 75 year old right-handed female with Meniere's disease who follows up for RLS but also for concerns regarding memory difficulties.  UPDATE: Current medications:  ropinriole 0.5mg mg one hour before bedtime, magnesium  Labs from 06/08/2020 showed B12 285, ferritin 24.5, CK 107.  She was advised to start ferrous sulfate 325mg  and B12 1011mcg daily.  Repeat labs from 12/13/2020 showed improved B12 of 621 but low ferritin of 17.7.    Her daughter-in-law was concerned about her memory because she frequently repeats questions.  ***.  She denies disorientation driving on familiar routes, denies trouble managing medications, denies difficulty performing routine chores  around the house.  PCP referred for neuropsychological testing ***  HISTORY: In September 2021, she developed bilateral leg discomfort.  She describes it as a "electrodes going off in my legs" as well as aching of her legs.  She notices it when in bed.  It would keep her up or wake her up in the middle of the night.  She would need to get out of bed for relief and apply an OTC ointment on her legs.  When she gets out of bed, her legs feel heavy, like they are going to buckle.  She has some non-radiating back pain after strenuous activity but no numbness or paresthesias.  No new medications or change in diet.  Her PCP started her on ropinirole in September, which has helped.  She takes 0.25mg  before bedtime.  She tried 0.50mg  once but didn't make a difference.  However, she still wakes up at 5 AM.  TSH from September 2021 was 1.61.  Past treatment:  none  Past Medical History: Past Medical History:  Diagnosis Date  . Colon polyps   . GERD (gastroesophageal reflux disease)   . Hyperlipidemia   . Osteopenia     Medications: Outpatient Encounter Medications as of 12/14/2020  Medication Sig  . amLODipine (NORVASC) 5 MG tablet TAKE 1 TABLET(5 MG) BY MOUTH DAILY (Patient taking differently: Take 5 mg by mouth in the morning and at bedtime.)  . Calcium-Magnesium-Vitamin D (CALCIUM MAGNESIUM PO) Take by mouth.  . estradiol (ESTRACE) 0.5 MG tablet Take 0.5 mg by mouth daily.  . famotidine-calcium carbonate-magnesium hydroxide (PEPCID COMPLETE) 10-800-165 MG chewable tablet Chew 1 tablet by  mouth daily as needed.  . ferrous sulfate 325 (65 FE) MG tablet Take 325 mg by mouth daily. Take 1/2 daily  . fluticasone (FLONASE) 50 MCG/ACT nasal spray Place 2 sprays into both nostrils at bedtime.  Marland Kitchen losartan (COZAAR) 25 MG tablet TAKE 1 TABLET(25 MG) BY MOUTH DAILY  . rOPINIRole (REQUIP) 0.25 MG tablet Take 3 tablets (0.75 mg total) by mouth at bedtime. Take one pill one hour prior to bed.  . vitamin B-12  (CYANOCOBALAMIN) 1000 MCG tablet Take 1,000 mcg by mouth daily.   No facility-administered encounter medications on file as of 12/14/2020.    Allergies: Allergies  Allergen Reactions  . Levofloxacin     REACTION: GI Intolerance as "horrible stomach pain"  . Metoprolol Succinate     REACTION: Tachycardia  . Penicillins     REACTION: yeast  . Sulfonamide Derivatives     REACTION: itching    Family History: Family History  Problem Relation Age of Onset  . Heart disease Father   . Osteoporosis Mother   . Lymphoma Mother        non-hodgkins  . Stroke Neg Hx   . Diabetes Neg Hx   . Colon cancer Neg Hx     Observations/Objective:   *** No acute distress.  Alert and oriented.  Speech fluent and not dysarthric.  Language intact.  Eyes orthophoric on primary gaze.  Face symmetric.   Follow Up Instructions:    -I discussed the assessment and treatment plan with the patient. The patient was provided an opportunity to ask questions and all were answered. The patient agreed with the plan and demonstrated an understanding of the instructions.   The patient was advised to call back or seek an in-person evaluation if the symptoms worsen or if the condition fails to improve as anticipated.   Dudley Major, DO

## 2020-12-14 ENCOUNTER — Ambulatory Visit: Payer: Medicare Other | Admitting: Neurology

## 2020-12-15 ENCOUNTER — Telehealth: Payer: Self-pay | Admitting: Internal Medicine

## 2020-12-15 MED ORDER — MELOXICAM 15 MG PO TABS: 15.0000 mg | ORAL_TABLET | Freq: Every day | ORAL | 0 refills | Status: DC

## 2020-12-15 NOTE — Addendum Note (Signed)
Addended by: Binnie Rail on: 12/15/2020 09:41 PM   Modules accepted: Orders

## 2020-12-15 NOTE — Telephone Encounter (Signed)
1.Medication Requested: meloxicam (MOBIC) 15 MG tablet    2. Pharmacy (Name, [facility], [place]): [facility] [address]  3. On Med List: no   4. Last Visit with PCP: 09-28-20  5. Next visit date with PCP: 05-06-21   Agent: Please be advised that RX refills may take up to 3 business days. We ask that you follow-up with your pharmacy.

## 2020-12-15 NOTE — Telephone Encounter (Signed)
Has been a little last since she used this medication-what did she want to take it for?  My concern with his medication is and has a potential to upset the stomach, elevated blood pressure and damage to the kidneys.  We do like to avoid it when possible or take it for the least amount of time.

## 2020-12-15 NOTE — Telephone Encounter (Signed)
rx sent in 

## 2020-12-19 NOTE — Progress Notes (Signed)
NEUROLOGY FOLLOW UP OFFICE NOTE  Ruth Jones [id number]  Assessment/Plan:   1.  Restless leg syndrome 2.  Short-term memory problems  1.  Regarding memory, will check MRI of brain and order neuropsychological evaluation 2.  For RLS, ropinirole 0.75mg .  Start ferrous sulfate 325mg  daily and continue B12 1035mcg daily.  Repeat labs 6 months 3.  Follow up 6 months or sooner if needed.  Subjective:  Ruth Jones is a 75 year old right-handed female with Meniere's disease who follows up for RLS but also presents for new concern regarding memory.  UPDATE: Current medications:  Ropinrole 0.75mg  one hour before bed, magnesium 150mg  QD, Hyland's Restful Leg pill  Ropinirole was increased back in October.  B12 was 285.  She was advised to start B12 1086mcg daily.  Ferritin level was 24.5.  She was advised to start ferrous sulfate 325mg  daily.  Stopped ferrous sulfate because it upset her stomach.  Repeat labs from 12/12/2020 demonstrated B12 621 but ferritin was lower, now 17.7.  She restarted ferrous sulfate.  She will sometimes take Hyland's Restful Legs pill.  Her daughter-in-law was concern about her memory.  She was reportedly repeating questions.  She is completely independent.  No known family history of dementia.  TSH in September was 1.61.    HISTORY: In September 2021, she developed bilateral leg discomfort.  She describes it as a "electrodes going off in my legs" as well as aching of her legs.  She notices it when in bed.  It would keep her up or wake her up in the middle of the night.  She would need to get out of bed for relief and apply an OTC ointment on her legs.  When she gets out of bed, her legs feel heavy, like they are going to buckle.  She has some non-radiating back pain after strenuous activity but no numbness or paresthesias.  No new medications or change in diet.  Her PCP started her on ropinirole in September, which has helped.  She takes 0.25mg  before bedtime.   She tried 0.50mg  once but didn't make a difference.  However, she still wakes up at 5 AM.  Current treatment:  Ropinirole 0.25mg  one hour prior to bedtime; magnesium 150mg  daily Past treatment:  none  PAST MEDICAL HISTORY: Past Medical History:  Diagnosis Date  . Colon polyps   . GERD (gastroesophageal reflux disease)   . Hyperlipidemia   . Osteopenia     MEDICATIONS: Current Outpatient Medications on File Prior to Visit  Medication Sig Dispense Refill  . amLODipine (NORVASC) 5 MG tablet TAKE 1 TABLET(5 MG) BY MOUTH DAILY (Patient taking differently: Take 5 mg by mouth in the morning and at bedtime.) 90 tablet 3  . Calcium-Magnesium-Vitamin D (CALCIUM MAGNESIUM PO) Take by mouth.    . estradiol (ESTRACE) 0.5 MG tablet Take 0.5 mg by mouth daily.    . famotidine-calcium carbonate-magnesium hydroxide (PEPCID COMPLETE) 10-800-165 MG chewable tablet Chew 1 tablet by mouth daily as needed. 60 tablet   . ferrous sulfate 325 (65 FE) MG tablet Take 325 mg by mouth daily. Take 1/2 daily    . fluticasone (FLONASE) 50 MCG/ACT nasal spray Place 2 sprays into both nostrils at bedtime.    Marland Kitchen losartan (COZAAR) 25 MG tablet TAKE 1 TABLET(25 MG) BY MOUTH DAILY 90 tablet 3  . meloxicam (MOBIC) 15 MG tablet Take 1 tablet (15 mg total) by mouth daily. 30 tablet 0  . rOPINIRole (REQUIP) 0.25 MG tablet Take  3 tablets (0.75 mg total) by mouth at bedtime. Take one pill one hour prior to bed. 90 tablet 5  . vitamin B-12 (CYANOCOBALAMIN) 1000 MCG tablet Take 1,000 mcg by mouth daily.     No current facility-administered medications on file prior to visit.    ALLERGIES: Allergies  Allergen Reactions  . Levofloxacin     REACTION: GI Intolerance as "horrible stomach pain"  . Metoprolol Succinate     REACTION: Tachycardia  . Penicillins     REACTION: yeast  . Sulfonamide Derivatives     REACTION: itching    FAMILY HISTORY: Family History  Problem Relation Age of Onset  . Heart disease Father   .  Osteoporosis Mother   . Lymphoma Mother        non-hodgkins  . Stroke Neg Hx   . Diabetes Neg Hx   . Colon cancer Neg Hx       Objective:  Blood pressure 132/72, pulse 62, height 5\' 2"  (1.575 m), weight 130 lb 9.6 oz (59.2 kg), SpO2 96 %. General: No acute distress.  Patient appears well-groomed.       Metta Clines, DO  CC: Billey Gosling, MD

## 2020-12-20 ENCOUNTER — Encounter: Payer: Self-pay | Admitting: Neurology

## 2020-12-20 ENCOUNTER — Other Ambulatory Visit: Payer: Self-pay

## 2020-12-20 ENCOUNTER — Ambulatory Visit (INDEPENDENT_AMBULATORY_CARE_PROVIDER_SITE_OTHER): Payer: Medicare Other | Admitting: Neurology

## 2020-12-20 VITALS — BP 132/72 | HR 62 | Ht 62.0 in | Wt 130.6 lb

## 2020-12-20 DIAGNOSIS — G2581 Restless legs syndrome: Secondary | ICD-10-CM

## 2020-12-20 DIAGNOSIS — R413 Other amnesia: Secondary | ICD-10-CM

## 2020-12-20 NOTE — Patient Instructions (Addendum)
1.  Continue ropinirole 0.75mg  one hour before bed.  May use Restful Legs.  Continue ferrous sulfate 2.  Check MRI of brain without contrast. We have sent a referral to [facility] for your MRI and they will call you directly to schedule your appointment. They are located at [place]. If you need to contact them directly please call [phone].  3.  Neurocognitive testing 4.  Follow up 6 months (or sooner if needed)

## 2020-12-29 ENCOUNTER — Telehealth: Payer: Self-pay | Admitting: Pharmacist

## 2021-01-04 NOTE — Telephone Encounter (Cosign Needed)
Three attempts were made to reach for for Hypertension Adherence call. Unable to reach patient, left message for patient to return call.   [place] Pharmacist Assistant [phone]

## 2021-01-07 ENCOUNTER — Telehealth: Payer: Self-pay | Admitting: Internal Medicine

## 2021-01-07 NOTE — Telephone Encounter (Signed)
LVM for pt to rtn my call to schedule AWV with [facility]. Please schedule this appt if pt calls the office.  °

## 2021-02-07 ENCOUNTER — Ambulatory Visit: Payer: Medicare Other

## 2021-02-27 ENCOUNTER — Other Ambulatory Visit: Payer: Self-pay | Admitting: Internal Medicine

## 2021-03-04 DIAGNOSIS — H43813 Vitreous degeneration, bilateral: Secondary | ICD-10-CM | POA: Diagnosis not present

## 2021-03-04 DIAGNOSIS — D3131 Benign neoplasm of right choroid: Secondary | ICD-10-CM | POA: Diagnosis not present

## 2021-03-04 DIAGNOSIS — H35373 Puckering of macula, bilateral: Secondary | ICD-10-CM | POA: Diagnosis not present

## 2021-03-04 DIAGNOSIS — H04123 Dry eye syndrome of bilateral lacrimal glands: Secondary | ICD-10-CM | POA: Diagnosis not present

## 2021-03-04 DIAGNOSIS — H02831 Dermatochalasis of right upper eyelid: Secondary | ICD-10-CM | POA: Diagnosis not present

## 2021-03-11 ENCOUNTER — Encounter: Payer: Medicare Other | Admitting: Counselor

## 2021-03-14 NOTE — Patient Instructions (Addendum)
Medications changes include :   meloxicam 15 mg daily with food  Your prescription(s) have been submitted to your pharmacy. Please take as directed and contact our office if you believe you are having problem(s) with the medication(s).    Plantar Fasciitis Rehab Ask your health care provider which exercises are safe for you. Do exercises exactly as told by your health care provider and adjust them as directed. It is normal to feel mild stretching, pulling, tightness, or discomfort as you do these exercises. Stop right away if you feel sudden pain or your pain gets worse. Do not begin these exercises until told by your health care provider. Stretching and range-of-motion exercises These exercises warm up your muscles and joints and improve the movement andflexibility of your foot. These exercises also help to relieve pain. Plantar fascia stretch  Sit with your left / right leg crossed over your opposite knee. Hold your heel with one hand with that thumb near your arch. With your other hand, hold your toes and gently pull them back toward the top of your foot. You should feel a stretch on the base (bottom) of your toes, or the bottom of your foot (plantar fascia), or both. Hold this stretch for__________ seconds. Slowly release your toes and return to the starting position. Repeat __________ times. Complete this exercise __________ times a day. Gastrocnemius stretch, standing This exercise is also called a calf (gastroc) stretch. It stretches the muscles in the back of the upper calf. Stand with your hands against a wall. Extend your left / right leg behind you, and bend your front knee slightly. Keeping your heels on the floor, your toes facing forward, and your back knee straight, shift your weight toward the wall. Do not arch your back. You should feel a gentle stretch in your upper calf. Hold this position for __________ seconds. Repeat __________ times. Complete this exercise  __________ times a day. Soleus stretch, standing This exercise is also called a calf (soleus) stretch. It stretches the muscles in the back of the lower calf. Stand with your hands against a wall. Extend your left / right leg behind you, and bend your front knee slightly. Keeping your heels on the floor and your toes facing forward, bend your back knee and shift your weight slightly over your back leg. You should feel a gentle stretch deep in your lower calf. Hold this position for __________ seconds. Repeat __________ times. Complete this exercise __________ times a day. Gastroc and soleus stretch, standing step This exercise stretches the muscles in the back of the lower leg. These muscles are in the upper calf (gastrocnemius) and the lower calf (soleus). Stand with the ball of your left / right foot on the front of a step. The ball of your foot is on the walking surface, right under your toes. Keep your other foot firmly on the same step. Hold on to the wall or a railing for balance. Slowly lift your other foot, allowing your body weight to press your heel down over the edge of the front of the step. Keep knee straight and unbent. You should feel a stretch in your calf. Hold this position for __________ seconds. Return both feet to the step. Repeat this exercise with a slight bend in your left / right knee. Repeat __________ times with your left / right knee straight and __________ times with your left / right knee bent. Complete this exercise __________ timesa day. Balance exercise This exercise builds your balance  and strength control of your arch to helptake pressure off your plantar fascia. Single leg stand If this exercise is too easy, you can try it with your eyes closed or while standing on a pillow. Without shoes, stand near a railing or in a doorway. You may hold on to the railing or door frame as needed. Stand on your left / right foot. Keep your big toe down on the floor and lift  the arch of your foot. You should feel a stretch across the bottom of your foot and your arch. Do not let your foot roll inward. Hold this position for __________ seconds. Repeat __________ times. Complete this exercise __________ times a day. This information is not intended to replace advice given to you by your health care provider. Make sure you discuss any questions you have with your healthcare provider. Document Revised: 05/20/2020 Document Reviewed: 05/20/2020 Elsevier Patient Education  [place].

## 2021-03-14 NOTE — Progress Notes (Signed)
Subjective:    Patient ID: Ruth Jones, female    DOB: Jul 26, 1946, 75 y.o.   MRN: [id number]  HPI The patient is here for an acute visit - plantar fasciitis  She has taken the meloxicam daily for a month and it helps, but her symptoms have recurred.  She has daily b/l foot pain.  She has not seen anyone for this and not stretching.    Medications and allergies reviewed with patient and updated if appropriate.  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     Calcium-Magnesium-Vitamin D (CALCIUM MAGNESIUM PO) Take by mouth.     doxycycline (ADOXA) 75 MG tablet Take by mouth.     estradiol (ESTRACE) 0.5 MG tablet Take 0.5 mg by mouth daily.     famotidine-calcium carbonate-magnesium hydroxide (PEPCID COMPLETE) 10-800-165 MG chewable tablet Chew 1 tablet by mouth daily as needed. 60 tablet    ferrous sulfate 325 (65 FE) MG tablet Take 325 mg by mouth daily. Take 1/2 daily     fluticasone (FLONASE) 50 MCG/ACT nasal spray Place 2 sprays into both nostrils at bedtime.     Homeopathic Products (LEG CRAMPS PO) Take by mouth.     Homeopathic Products ([place]) FOAM Apply topically. Patient takes  for restless legs     losartan (COZAAR) 25 MG tablet TAKE 1 TABLET(25 MG) BY MOUTH DAILY 90 tablet 3   metroNIDAZOLE (METROCREAM) 0.75 % cream Apply topically daily.     vitamin B-12 (CYANOCOBALAMIN) 1000 MCG tablet Take 1,000 mcg by mouth daily.     No current facility-administered medications on file prior to visit.    Past Medical History:  Diagnosis Date   Colon polyps    GERD (gastroesophageal reflux disease)    Hyperlipidemia    Osteopenia     Past Surgical History:  Procedure Laterality Date   COLONOSCOPY W/ POLYPECTOMY     MOHS SURGERY  2016   melanoma RLE   UPPER GASTROINTESTINAL ENDOSCOPY  2011   Dr Henrene Pastor   VESICOVAGINAL FISTULA CLOSURE W/ TAH  1990    Social History   Socioeconomic History   Marital status: Married    Spouse name: Not on file   Number of children: 2   Years of education: Not on file   Highest education level: Not on file  Occupational History   Not on file  Tobacco Use   Smoking status: Never   Smokeless tobacco: Never  Vaping Use   Vaping Use: Never used  Substance and Sexual Activity   Alcohol use: Yes    Comment:  10-12 glasses of wine weekly    Drug use: No   Sexual activity: Not Currently  Other Topics  Concern   Not on file  Social History Narrative   Right handed   Two story home   Drinks caffeine   Social Determinants of Health   Financial Resource Strain: Low Risk    Difficulty of Paying Living Expenses: Not hard at all  Food Insecurity: Not on file  Transportation Needs: Not on file  Physical Activity: Not on file  Stress: Not on file  Social Connections: Not on file    Family History  Problem Relation Age of Onset   Heart disease Father    Osteoporosis Mother    Lymphoma Mother        non-hodgkins   Stroke Neg Hx    Diabetes Neg Hx    Colon cancer Neg Hx     Review of Systems     Objective:   Vitals:   03/15/21 1514  BP: 130/76  Pulse: 64  Temp: 98.6 F (37 C)  SpO2: 98%   BP Readings from  Last 3 Encounters:  03/15/21 130/76  12/20/20 132/72  09/28/20 112/78   Wt Readings from Last 3 Encounters:  03/15/21 131 lb (59.4 kg)  12/20/20 130 lb 9.6 oz (59.2 kg)  09/28/20 127 lb (57.6 kg)   Body mass index is 23.96 kg/m.   Physical Exam         Assessment & Plan:    See Problem List for Assessment and Plan of chronic medical problems.    This visit occurred during the SARS-CoV-2 public health emergency.  Safety protocols were in place, including screening questions prior to the visit, additional usage of staff PPE, and extensive cleaning of exam room while observing appropriate contact time as indicated for disinfecting solutions.

## 2021-03-15 ENCOUNTER — Ambulatory Visit (INDEPENDENT_AMBULATORY_CARE_PROVIDER_SITE_OTHER): Payer: Medicare Other | Admitting: Internal Medicine

## 2021-03-15 ENCOUNTER — Encounter: Payer: Self-pay | Admitting: Internal Medicine

## 2021-03-15 ENCOUNTER — Other Ambulatory Visit: Payer: Self-pay

## 2021-03-15 VITALS — BP 130/76 | HR 64 | Temp 98.6°F | Ht 62.0 in | Wt 131.0 lb

## 2021-03-15 DIAGNOSIS — M722 Plantar fascial fibromatosis: Secondary | ICD-10-CM

## 2021-03-15 MED ORDER — ROPINIROLE HCL 0.25 MG PO TABS
0.7500 mg | ORAL_TABLET | Freq: Every day | ORAL | 1 refills | Status: DC
Start: 2021-03-15 — End: 2021-09-20

## 2021-03-15 MED ORDER — MELOXICAM 15 MG PO TABS
15.0000 mg | ORAL_TABLET | Freq: Every day | ORAL | 2 refills | Status: DC
Start: 2021-03-15 — End: 2021-04-12

## 2021-03-15 NOTE — Assessment & Plan Note (Signed)
Chronic meloxicam has helped in the past - will start meloxicam 15 mg qd with food again - discussed concern with long term side effects - will try x 1 month and then decrease dose if possible to qod or prn No other nsaids while on meloxicam Stressed the importance of stretching, icing No barefoot - sneakers w/ good support - change freq Deferred referral at this time - does not want an injection Would consider PT if no improvement

## 2021-03-22 ENCOUNTER — Telehealth: Payer: Self-pay | Admitting: Internal Medicine

## 2021-03-22 NOTE — Telephone Encounter (Signed)
LVM for pt to rtn my call to schedule AWV with [facility]. Please schedule this appt if pt calls the office.  °

## 2021-03-28 ENCOUNTER — Telehealth: Payer: Self-pay | Admitting: Pharmacist

## 2021-03-28 NOTE — Progress Notes (Signed)
    Chronic Care Management Pharmacy Assistant   Name: Ruth Jones  MRN: [id number] DOB: 1946/06/08  Reason for Encounter: Disease State   Conditions to be addressed/monitored: General    Recent office visits:  03/15/21 Dr. Quay Burow (PCP) Reason: Plantar Fascitis  Medication changes: meloxicam 15 mg daily with food  Recent consult visits:  12/20/20 Metta Clines, DO Neurology Reason: Restless leg syndrome 2.  Short-term memory problems Orders: will check MRI of brain and order neuropsychological evaluation Medication changes:Start ferrous sulfate '325mg'$  daily  Hospital visits:  None in previous 6 months  Medications: Outpatient Encounter Medications as of 03/28/2021  Medication Sig   amLODipine (NORVASC) 5 MG tablet TAKE 1 TABLET(5 MG) BY MOUTH DAILY (Patient taking differently: Take 5 mg by mouth in the morning and at bedtime.)   Bacillus Coagulans-Inulin (ALIGN PREBIOTIC-PROBIOTIC PO) Take by mouth.   Calcium-Magnesium-Vitamin D (CALCIUM MAGNESIUM PO) Take by mouth.   doxycycline (ADOXA) 75 MG tablet Take by mouth.   estradiol (ESTRACE) 0.5 MG tablet Take 0.5 mg by mouth daily.   famotidine-calcium carbonate-magnesium hydroxide (PEPCID COMPLETE) 10-800-165 MG chewable tablet Chew 1 tablet by mouth daily as needed.   ferrous sulfate 325 (65 FE) MG tablet Take 325 mg by mouth daily. Take 1/2 daily   fluticasone (FLONASE) 50 MCG/ACT nasal spray Place 2 sprays into both nostrils at bedtime.   Homeopathic Products (LEG CRAMPS PO) Take by mouth.   Homeopathic Products ([place]) FOAM Apply topically. Patient takes for restless legs   losartan (COZAAR) 25 MG tablet TAKE 1 TABLET(25 MG) BY MOUTH DAILY   meloxicam (MOBIC) 15 MG tablet Take 1 tablet (15 mg total) by mouth daily.   metroNIDAZOLE (METROCREAM) 0.75 % cream Apply topically daily.   rOPINIRole (REQUIP) 0.25 MG tablet Take 3 tablets (0.75 mg total) by mouth at bedtime.   vitamin B-12 (CYANOCOBALAMIN) 1000 MCG tablet Take  1,000 mcg by mouth daily.   No facility-administered encounter medications on file as of 03/28/2021.    Pharmacist Review  Have you had any problems recently with your health? Patient states that she saw Dr. Quay Burow a few weeks ago for plantar fascitis and does not have any new health problems that Dr. Quay Burow hasn't addressed  Have you had any problems with your pharmacy? Patient states that she is not having any problems with getting medications or the cost of medications from the pharmacy  What issues or side effects are you having with your medications? Patient states that she is not having any side effects from medications  What would you like me to pass along to [facility] for them to help you with?  Patient states that she is doing fine and does not have any concerns at this time  What can we do to take care of you better?  Patient states not at this time  Star Rating Drugs: Losartan 25 mg last fill 02/20/21 90 ds  Ethelene Hal Clinical Pharmacist Assistant [phone]   Time spent:20

## 2021-03-29 ENCOUNTER — Encounter: Payer: Medicare Other | Admitting: Counselor

## 2021-04-09 ENCOUNTER — Other Ambulatory Visit: Payer: Self-pay | Admitting: Internal Medicine

## 2021-04-26 ENCOUNTER — Encounter: Payer: Medicare Other | Admitting: Counselor

## 2021-05-04 ENCOUNTER — Encounter: Payer: Self-pay | Admitting: Counselor

## 2021-05-05 ENCOUNTER — Encounter: Payer: Self-pay | Admitting: Internal Medicine

## 2021-05-05 DIAGNOSIS — D2262 Melanocytic nevi of left upper limb, including shoulder: Secondary | ICD-10-CM | POA: Diagnosis not present

## 2021-05-05 DIAGNOSIS — Z85820 Personal history of malignant melanoma of skin: Secondary | ICD-10-CM | POA: Diagnosis not present

## 2021-05-05 DIAGNOSIS — L821 Other seborrheic keratosis: Secondary | ICD-10-CM | POA: Diagnosis not present

## 2021-05-05 DIAGNOSIS — L820 Inflamed seborrheic keratosis: Secondary | ICD-10-CM | POA: Diagnosis not present

## 2021-05-05 DIAGNOSIS — D2261 Melanocytic nevi of right upper limb, including shoulder: Secondary | ICD-10-CM | POA: Diagnosis not present

## 2021-05-05 DIAGNOSIS — D485 Neoplasm of uncertain behavior of skin: Secondary | ICD-10-CM | POA: Diagnosis not present

## 2021-05-05 DIAGNOSIS — L565 Disseminated superficial actinic porokeratosis (DSAP): Secondary | ICD-10-CM | POA: Diagnosis not present

## 2021-05-05 DIAGNOSIS — B078 Other viral warts: Secondary | ICD-10-CM | POA: Diagnosis not present

## 2021-05-05 DIAGNOSIS — Z85828 Personal history of other malignant neoplasm of skin: Secondary | ICD-10-CM | POA: Diagnosis not present

## 2021-05-05 DIAGNOSIS — D2271 Melanocytic nevi of right lower limb, including hip: Secondary | ICD-10-CM | POA: Diagnosis not present

## 2021-05-05 DIAGNOSIS — D225 Melanocytic nevi of trunk: Secondary | ICD-10-CM | POA: Diagnosis not present

## 2021-05-05 NOTE — Progress Notes (Signed)
Subjective:    Patient ID: Ruth Jones, female    DOB: [birth year], 75 y.o.   MRN: [id number]   This visit occurred during the SARS-CoV-2 public health emergency.  Safety protocols were in place, including screening questions prior to the visit, additional usage of staff PPE, and extensive cleaning of exam room while observing appropriate contact time as indicated for disinfecting solutions.    HPI She is here for a physical exam.   Last night she had a very bad night with her restless leg syndrome.  Typically she does okay so she is not sure what was different.  She is pretty sure she took the medication, but maybe she did not.  She still has plantar fasciitis.  She does feel like the meloxicam helps better than anything else.  She typically wears good foot wear.  Medications and allergies reviewed with patient and updated if appropriate.  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     Calcium-Magnesium-Vitamin D (CALCIUM MAGNESIUM PO) Take by mouth.     doxycycline (ADOXA) 75 MG tablet Take by mouth.     estradiol (ESTRACE) 0.5 MG tablet Take 0.5 mg by mouth daily.     famotidine-calcium  carbonate-magnesium hydroxide (PEPCID COMPLETE) 10-800-165 MG chewable tablet Chew 1 tablet by mouth daily as needed. 60 tablet    ferrous sulfate 325 (65 FE) MG tablet Take 325 mg by mouth daily. Take 1/2 daily     fluticasone (FLONASE) 50 MCG/ACT nasal spray Place 2 sprays into both nostrils at bedtime.     Homeopathic Products (LEG CRAMPS PO) Take by mouth.     Homeopathic Products ([place]) FOAM Apply topically. Patient takes for restless legs     losartan (COZAAR) 25 MG tablet TAKE 1 TABLET(25 MG) BY MOUTH DAILY 90 tablet 3   meloxicam (MOBIC) 15 MG tablet TAKE 1 TABLET(15 MG) BY MOUTH DAILY 90 tablet 1   metroNIDAZOLE (METROCREAM) 0.75 % cream Apply topically daily.     rOPINIRole (REQUIP) 0.25 MG tablet Take 3 tablets (0.75 mg total) by mouth at bedtime. 270 tablet 1   vitamin B-12 (CYANOCOBALAMIN) 1000 MCG tablet Take 1,000 mcg by mouth daily.     No current facility-administered medications on file prior to visit.    Past Medical History:  Diagnosis Date   Colon polyps    GERD (gastroesophageal reflux disease)    Hyperlipidemia    Osteopenia     Past Surgical History:  Procedure Laterality Date   COLONOSCOPY W/ POLYPECTOMY     MOHS SURGERY  2016   melanoma RLE   UPPER GASTROINTESTINAL ENDOSCOPY  2011   Dr Henrene Pastor   VESICOVAGINAL FISTULA CLOSURE W/ TAH  1990  Social History   Socioeconomic History   Marital status: Married    Spouse name: Not on file   Number of children: 2   Years of education: Not on file   Highest education level: Not on file  Occupational History   Not on file  Tobacco Use   Smoking status: Never   Smokeless tobacco: Never  Vaping Use   Vaping Use: Never used  Substance and Sexual Activity   Alcohol use: Yes    Comment:  10-12 glasses of wine weekly    Drug use: No   Sexual activity: Not Currently  Other Topics Concern   Not on file  Social History Narrative   Right handed   Two story home   Drinks caffeine   Social  Determinants of Health   Financial Resource Strain: Low Risk    Difficulty of Paying Living Expenses: Not hard at all  Food Insecurity: Not on file  Transportation Needs: Not on file  Physical Activity: Not on file  Stress: Not on file  Social Connections: Not on file    Family History  Problem Relation Age of Onset   Heart disease Father    Osteoporosis Mother    Lymphoma Mother        non-hodgkins   Stroke Neg Hx    Diabetes Neg Hx    Colon cancer Neg Hx     Review of Systems  Constitutional:  Negative for chills and fever.  Eyes:  Negative for visual disturbance.  Respiratory:  Negative for cough, shortness of breath and wheezing.   Cardiovascular:  Positive for leg swelling (R ankle - mild). Negative for chest pain and palpitations.  Gastrointestinal:  Negative for abdominal pain, blood in stool, constipation, diarrhea and nausea.  Genitourinary:  Negative for dysuria.  Musculoskeletal:  Positive for arthralgias (knees occ). Negative for back pain.  Skin:  Negative for color change and rash.  Neurological:  Positive for headaches (occ). Negative for dizziness and light-headedness.  Psychiatric/Behavioral:  Negative for dysphoric mood. The patient is not nervous/anxious.       Objective:   Vitals:   05/06/21 1127  BP: 120/74  Pulse: 68  Temp: 98.5 F (36.9 C)  SpO2: 99%   Filed Weights   05/06/21 1127  Weight: 127 lb (57.6 kg)   Body mass index is 23.23 kg/m.  BP Readings from Last 3 Encounters:  05/06/21 120/74  03/15/21 130/76  12/20/20 132/72    Wt Readings from Last 3 Encounters:  05/06/21 127 lb (57.6 kg)  03/15/21 131 lb (59.4 kg)  12/20/20 130 lb 9.6 oz (59.2 kg)   Depression screen [facility] 2/9 05/04/2020 04/14/2019 04/08/2018 04/06/2017 04/04/2016  Decreased Interest 0 1 0 0 0  Down, Depressed, Hopeless 0 0 0 0 0  PHQ - 2 Score 0 1 0 0 0  Altered sleeping - 1 - - -  Tired, decreased energy - 0 - - -  Change in appetite - 0 - - -  Feeling bad or  failure about yourself  - 0 - - -  Trouble concentrating - 0 - - -  Moving slowly or fidgety/restless - 0 - - -  Suicidal thoughts - 0 - - -  PHQ-9 Score - 2 - - -    GAD 7 : Generalized Anxiety Score 04/14/2019  Nervous, Anxious, on Edge 0  Control/stop worrying 0  Worry too much - different things 0  Trouble relaxing 0  Restless 0  Easily annoyed or irritable  0  Afraid - awful might happen 0  Total GAD 7 Score 0        Physical Exam Constitutional: She appears well-developed and well-nourished. No distress.  HENT:  Head: Normocephalic and atraumatic.  Right Ear: External ear normal. Normal ear canal and TM Left Ear: External ear normal.  Normal ear canal and TM Mouth/Throat: Oropharynx is clear and moist.  Eyes: Conjunctivae and EOM are normal.  Neck: Neck supple. No tracheal deviation present. No thyromegaly present.  No carotid bruit  Cardiovascular: Normal rate, regular rhythm and normal heart sounds.   No murmur heard.  No edema. Pulmonary/Chest: Effort normal and breath sounds normal. No respiratory distress. She has no wheezes. She has no rales.  Breast: deferred   Abdominal: Soft. She exhibits no distension. There is no tenderness.  Lymphadenopathy: She has no cervical adenopathy.  Skin: Skin is warm and dry. She is not diaphoretic.  Psychiatric: She has a normal mood and affect. Her behavior is normal.     Lab Results  Component Value Date   WBC 5.0 05/05/2020   HGB 13.9 05/05/2020   HCT 42.3 05/05/2020   PLT 247 05/05/2020   GLUCOSE 86 05/05/2020   CHOL 198 05/05/2020   TRIG 122 05/05/2020   HDL 67 05/05/2020   LDLDIRECT 124.8 10/09/2011   LDLCALC 108 (H) 05/05/2020   ALT 21 05/05/2020   AST 22 05/05/2020   NA 139 05/05/2020   K 4.4 05/05/2020   CL 103 05/05/2020   CREATININE 0.97 (H) 05/05/2020   BUN 17 05/05/2020   CO2 31 05/05/2020   TSH 1.61 05/05/2020         Assessment & Plan:   Physical exam: Screening blood work   ordered Exercise  regular Weight  normal Substance abuse  none Sees derm   Screened for depression using the PHQ 9 scale.  No evidence of depression.   Screened for anxiety using GAD7 Scale.  No evidence of anxiety.   Reviewed recommended immunizations.   Health Maintenance  Topic Date Due   COVID-19 Vaccine (4 - Booster for Moderna series) 09/20/2020   INFLUENZA VACCINE  03/21/2021   DEXA SCAN  05/25/2023   TETANUS/TDAP  02/01/2025   Hepatitis C Screening  Completed   PNA vac Low Risk Adult  Completed   Zoster Vaccines- Shingrix  Completed   HPV VACCINES  Aged Out          See Problem List for Assessment and Plan of chronic medical problems.

## 2021-05-05 NOTE — Patient Instructions (Addendum)
Blood work was ordered.     Medications changes include :   none   Please followup in 1 year   Health Maintenance, Female Adopting a healthy lifestyle and getting preventive care are important in promoting health and wellness. Ask your health care provider about: The right schedule for you to have regular tests and exams. Things you can do on your own to prevent diseases and keep yourself healthy. What should I know about diet, weight, and exercise? Eat a healthy diet  Eat a diet that includes plenty of vegetables, fruits, low-fat dairy products, and lean protein. Do not eat a lot of foods that are high in solid fats, added sugars, or sodium. Maintain a healthy weight Body mass index (BMI) is used to identify weight problems. It estimates body fat based on height and weight. Your health care provider can help determine your BMI and help you achieve or maintain a healthy weight. Get regular exercise Get regular exercise. This is one of the most important things you can do for your health. Most adults should: Exercise for at least 150 minutes each week. The exercise should increase your heart rate and make you sweat (moderate-intensity exercise). Do strengthening exercises at least twice a week. This is in addition to the moderate-intensity exercise. Spend less time sitting. Even light physical activity can be beneficial. Watch cholesterol and blood lipids Have your blood tested for lipids and cholesterol at 77 years of age, then have this test every 5 years. Have your cholesterol levels checked more often if: Your lipid or cholesterol levels are high. You are older than 77 years of age. You are at high risk for heart disease. What should I know about cancer screening? Depending on your health history and family history, you may need to have cancer screening at various ages. This may include screening for: Breast cancer. Cervical cancer. Colorectal cancer. Skin cancer. Lung  cancer. What should I know about heart disease, diabetes, and high blood pressure? Blood pressure and heart disease High blood pressure causes heart disease and increases the risk of stroke. This is more likely to develop in people who have high blood pressure readings, are of African descent, or are overweight. Have your blood pressure checked: Every 3-5 years if you are 49-63 years of age. Every year if you are 25 years old or older. Diabetes Have regular diabetes screenings. This checks your fasting blood sugar level. Have the screening done: Once every three years after age 25 if you are at a normal weight and have a low risk for diabetes. More often and at a younger age if you are overweight or have a high risk for diabetes. What should I know about preventing infection? Hepatitis B If you have a higher risk for hepatitis B, you should be screened for this virus. Talk with your health care provider to find out if you are at risk for hepatitis B infection. Hepatitis C Testing is recommended for: Everyone born from [birth year] through 1967. Anyone with known risk factors for hepatitis C. Sexually transmitted infections (STIs) Get screened for STIs, including gonorrhea and chlamydia, if: You are sexually active and are younger than 77 years of age. You are older than 77 years of age and your health care provider tells you that you are at risk for this type of infection. Your sexual activity has changed since you were last screened, and you are at increased risk for chlamydia or gonorrhea. Ask your health care provider if you are at  risk. Ask your health care provider about whether you are at high risk for HIV. Your health care provider may recommend a prescription medicine to help prevent HIV infection. If you choose to take medicine to prevent HIV, you should first get tested for HIV. You should then be tested every 3 months for as long as you are taking the medicine. Pregnancy If you are about  to stop having your period (premenopausal) and you may become pregnant, seek counseling before you get pregnant. Take 400 to 800 micrograms (mcg) of folic acid every day if you become pregnant. Ask for birth control (contraception) if you want to prevent pregnancy. Osteoporosis and menopause Osteoporosis is a disease in which the bones lose minerals and strength with aging. This can result in bone fractures. If you are 3 years old or older, or if you are at risk for osteoporosis and fractures, ask your health care provider if you should: Be screened for bone loss. Take a calcium or vitamin D supplement to lower your risk of fractures. Be given hormone replacement therapy (HRT) to treat symptoms of menopause. Follow these instructions at home: Lifestyle Do not use any products that contain nicotine or tobacco, such as cigarettes, e-cigarettes, and chewing tobacco. If you need help quitting, ask your health care provider. Do not use street drugs. Do not share needles. Ask your health care provider for help if you need support or information about quitting drugs. Alcohol use Do not drink alcohol if: Your health care provider tells you not to drink. You are pregnant, may be pregnant, or are planning to become pregnant. If you drink alcohol: Limit how much you use to 0-1 drink a day. Limit intake if you are breastfeeding. Be aware of how much alcohol is in your drink. In the [place] one drink equals one 12 oz bottle of beer (355 mL), one 5 oz glass of wine (148 mL), or one 1 oz glass of hard liquor (44 mL). General instructions Schedule regular health, dental, and eye exams. Stay current with your vaccines. Tell your health care provider if: You often feel depressed. You have ever been abused or do not feel safe at home. Summary Adopting a healthy lifestyle and getting preventive care are important in promoting health and wellness. Follow your health care provider's instructions about  healthy diet, exercising, and getting tested or screened for diseases. Follow your health care provider's instructions on monitoring your cholesterol and blood pressure. This information is not intended to replace advice given to you by your health care provider. Make sure you discuss any questions you have with your health care provider. Document Revised: 10/15/2020 Document Reviewed: 07/31/2018 Elsevier Patient Education  2022 [employer].

## 2021-05-05 NOTE — Assessment & Plan Note (Addendum)
Chronic dexa up to date Continue calcium and vitamin D Continue regular exercise

## 2021-05-06 ENCOUNTER — Other Ambulatory Visit: Payer: Self-pay

## 2021-05-06 ENCOUNTER — Ambulatory Visit (INDEPENDENT_AMBULATORY_CARE_PROVIDER_SITE_OTHER): Payer: Medicare Other | Admitting: Internal Medicine

## 2021-05-06 VITALS — BP 120/74 | HR 68 | Temp 98.5°F | Ht 62.0 in | Wt 127.0 lb

## 2021-05-06 DIAGNOSIS — M722 Plantar fascial fibromatosis: Secondary | ICD-10-CM

## 2021-05-06 DIAGNOSIS — Z1331 Encounter for screening for depression: Secondary | ICD-10-CM | POA: Diagnosis not present

## 2021-05-06 DIAGNOSIS — K219 Gastro-esophageal reflux disease without esophagitis: Secondary | ICD-10-CM | POA: Diagnosis not present

## 2021-05-06 DIAGNOSIS — G2581 Restless legs syndrome: Secondary | ICD-10-CM | POA: Diagnosis not present

## 2021-05-06 DIAGNOSIS — Z0000 Encounter for general adult medical examination without abnormal findings: Secondary | ICD-10-CM

## 2021-05-06 DIAGNOSIS — I10 Essential (primary) hypertension: Secondary | ICD-10-CM

## 2021-05-06 DIAGNOSIS — M8589 Other specified disorders of bone density and structure, multiple sites: Secondary | ICD-10-CM | POA: Diagnosis not present

## 2021-05-06 LAB — CBC WITH DIFFERENTIAL/PLATELET
Basophils Absolute: 0.1 10*3/uL (ref 0.0–0.1)
Basophils Relative: 0.7 % (ref 0.0–3.0)
Eosinophils Absolute: 0.2 10*3/uL (ref 0.0–0.7)
Eosinophils Relative: 1.9 % (ref 0.0–5.0)
HCT: 47.2 % — ABNORMAL HIGH (ref 36.0–46.0)
Hemoglobin: 15.8 g/dL — ABNORMAL HIGH (ref 12.0–15.0)
Lymphocytes Relative: 23.3 % (ref 12.0–46.0)
Lymphs Abs: 1.9 10*3/uL (ref 0.7–4.0)
MCHC: 33.4 g/dL (ref 30.0–36.0)
MCV: 96.3 fl (ref 78.0–100.0)
Monocytes Absolute: 0.7 10*3/uL (ref 0.1–1.0)
Monocytes Relative: 8.9 % (ref 3.0–12.0)
Neutro Abs: 5.4 10*3/uL (ref 1.4–7.7)
Neutrophils Relative %: 65.2 % (ref 43.0–77.0)
Platelets: 272 10*3/uL (ref 150.0–400.0)
RBC: 4.91 Mil/uL (ref 3.87–5.11)
RDW: 13.2 % (ref 11.5–15.5)
WBC: 8.2 10*3/uL (ref 4.0–10.5)

## 2021-05-06 LAB — COMPREHENSIVE METABOLIC PANEL
ALT: 31 U/L (ref 0–35)
AST: 31 U/L (ref 0–37)
Albumin: 4.4 g/dL (ref 3.5–5.2)
Alkaline Phosphatase: 81 U/L (ref 39–117)
BUN: 22 mg/dL (ref 6–23)
CO2: 27 mEq/L (ref 19–32)
Calcium: 9.9 mg/dL (ref 8.4–10.5)
Chloride: 100 mEq/L (ref 96–112)
Creatinine, Ser: 1.01 mg/dL (ref 0.40–1.20)
GFR: 53.86 mL/min — ABNORMAL LOW (ref >60.00)
Glucose, Bld: 77 mg/dL (ref 70–99)
Potassium: 4.4 mEq/L (ref 3.5–5.1)
Sodium: 138 mEq/L (ref 135–145)
Total Bilirubin: 1.2 mg/dL (ref 0.2–1.2)
Total Protein: 7.5 g/dL (ref 6.0–8.3)

## 2021-05-06 LAB — LIPID PANEL
Cholesterol: 239 mg/dL — ABNORMAL HIGH (ref 0–200)
HDL: 79.9 mg/dL (ref >39.00)
LDL Cholesterol: 138 mg/dL — ABNORMAL HIGH (ref 0–99)
NonHDL: 158.87
Total CHOL/HDL Ratio: 3
Triglycerides: 105 mg/dL (ref 0.0–149.0)
VLDL: 21 mg/dL (ref 0.0–40.0)

## 2021-05-06 LAB — VITAMIN D 25 HYDROXY (VIT D DEFICIENCY, FRACTURES): VITD: 53.92 ng/mL (ref 30.00–100.00)

## 2021-05-06 LAB — TSH: TSH: 1.91 u[IU]/mL (ref 0.35–5.50)

## 2021-05-06 NOTE — Assessment & Plan Note (Addendum)
Chronic Controlled most of the time, but did have a bad night last night She will try 1 mg of the Requip tonight and let me know if we need to adjust the dose over the next few days depending on her symptoms Continue requip 0.75 mg HS for now

## 2021-05-06 NOTE — Assessment & Plan Note (Addendum)
Chronic BP well controlled Continue amlodipine 5 mg qd, losartan 25 mg qd cmp

## 2021-05-06 NOTE — Assessment & Plan Note (Signed)
Chronic-intermittent Has had this off and on again Okay to take meloxicam 15 mg daily as needed-encouraged only take this short-term because of long-term risks which we have discussed Continue regular stretching, using a tennis ball or ice water bottle and good foot wear Avoid barefoot

## 2021-05-06 NOTE — Assessment & Plan Note (Addendum)
Chronic Intermittent-having some increased recently-discussed okay to take Prilosec for 14 days and then go back to Pepcid Complete OTC

## 2021-05-08 ENCOUNTER — Encounter: Payer: Self-pay | Admitting: Internal Medicine

## 2021-06-23 ENCOUNTER — Ambulatory Visit: Payer: Medicare Other | Admitting: Neurology

## 2021-07-15 ENCOUNTER — Other Ambulatory Visit: Payer: Self-pay | Admitting: Internal Medicine

## 2021-08-06 ENCOUNTER — Other Ambulatory Visit: Payer: Self-pay | Admitting: Internal Medicine

## 2021-08-08 ENCOUNTER — Other Ambulatory Visit: Payer: Self-pay | Admitting: Internal Medicine

## 2021-08-17 ENCOUNTER — Encounter: Payer: Self-pay | Admitting: Psychology

## 2021-08-17 ENCOUNTER — Encounter: Payer: Medicare Other | Admitting: Counselor

## 2021-08-24 ENCOUNTER — Encounter: Payer: Medicare Other | Admitting: Counselor

## 2021-09-08 ENCOUNTER — Telehealth: Payer: Medicare Other

## 2021-09-18 ENCOUNTER — Other Ambulatory Visit: Payer: Self-pay | Admitting: Internal Medicine

## 2021-09-20 DIAGNOSIS — Z1231 Encounter for screening mammogram for malignant neoplasm of breast: Secondary | ICD-10-CM | POA: Diagnosis not present

## 2021-10-21 ENCOUNTER — Telehealth: Payer: Medicare Other

## 2021-10-21 NOTE — Progress Notes (Unsigned)
Chronic Care Management Pharmacy Note  Requip dose taking? HLD? Ever taken statin? CP 11 minutes   10/21/2021 Name:  Ruth KETTLEWELL MRN:  [id number] DOB:  05-Aug-1946  Summary: ***  Recommendations/Changes made from today's visit: ***  Plan: ***   Subjective: Ruth Jones is an 76 y.o. year old female who is a primary patient of Burns, Claudina Lick, MD.  The CCM team was consulted for assistance with disease management and care coordination needs.    Engaged with patient by telephone for follow up visit in response to provider referral for pharmacy case management and/or care coordination services.   Consent to Services:  The patient was given the following information about Chronic Care Management services today, agreed to services, and gave verbal consent: 1. CCM service includes personalized support from designated clinical staff supervised by the primary care provider, including individualized plan of care and coordination with other care providers 2. 24/7 contact phone numbers for assistance for urgent and routine care needs. 3. Service will only be billed when office clinical staff spend 20 minutes or more in a month to coordinate care. 4. Only one practitioner may furnish and bill the service in a calendar month. 5.The patient may stop CCM services at any time (effective at the end of the month) by phone call to the office staff. 6. The patient will be responsible for cost sharing (co-pay) of up to 20% of the service fee (after annual deductible is met). Patient agreed to services and consent obtained.  Patient Care Team: Binnie Rail, MD as PCP - General (Internal Medicine) Charlton Haws, [facility] as Pharmacist (Pharmacist)  Recent office visits: 05/06/2021 - Dr. Quay Burow - possible to increase requip to $RemoveB'1mg'iTuneOAA$  nightly if needed - no changes to medications at this time - follow up in 1 year   Recent consult visits: None in previous 6 months   Hospital visits: None in  previous 6 months  Objective:  Lab Results  Component Value Date   CREATININE 1.01 05/06/2021   BUN 22 05/06/2021   GFR 53.86 (L) 05/06/2021   NA 138 05/06/2021   K 4.4 05/06/2021   CALCIUM 9.9 05/06/2021   CO2 27 05/06/2021    Lab Results  Component Value Date/Time   GFR 53.86 (L) 05/06/2021 12:16 PM   GFR 64.32 04/14/2019 10:03 AM    Last diabetic Eye exam: No results found for: HMDIABEYEEXA  Last diabetic Foot exam: No results found for: HMDIABFOOTEX   Lab Results  Component Value Date   CHOL 239 (H) 05/06/2021   HDL 79.90 05/06/2021   LDLCALC 138 (H) 05/06/2021   LDLDIRECT 124.8 10/09/2011   TRIG 105.0 05/06/2021   CHOLHDL 3 05/06/2021    Hepatic Function Latest Ref Rng & Units 05/06/2021 05/05/2020 04/14/2019  Total Protein 6.0 - 8.3 g/dL 7.5 5.9(L) 7.0  Albumin 3.5 - 5.2 g/dL 4.4 - 4.4  AST 0 - 37 U/L $Remo'31 22 21  'hzdAe$ ALT 0 - 35 U/L $Remo'31 21 19  'ZJnFt$ Alk Phosphatase 39 - 117 U/L 81 - 58  Total Bilirubin 0.2 - 1.2 mg/dL 1.2 0.8 1.2  Bilirubin, Direct 0.0 - 0.3 mg/dL - - -    Lab Results  Component Value Date/Time   TSH 1.91 05/06/2021 12:16 PM   TSH 1.61 05/05/2020 09:04 AM    CBC Latest Ref Rng & Units 05/06/2021 05/05/2020 04/14/2019  WBC 4.0 - 10.5 K/uL 8.2 5.0 6.3  Hemoglobin 12.0 - 15.0 g/dL 15.8(H) 13.9 14.5  Hematocrit  36.0 - 46.0 % 47.2(H) 42.3 42.9  Platelets 150.0 - 400.0 K/uL 272.0 247 246.0   Iron/TIBC/Ferritin/ %Sat    Component Value Date/Time   FERRITIN 17.7 12/13/2020 1443    Lab Results  Component Value Date/Time   VD25OH 53.92 05/06/2021 12:16 PM   VD25OH 32.56 08/20/2015 01:59 PM   Clinical ASCVD: No  The 10-year ASCVD risk score (Arnett DK, et al., 2019) is: 23.7%   Values used to calculate the score:     Age: 83 years     Sex: Female     Is Non-Hispanic African American: No     Diabetic: No     Tobacco smoker: No     Systolic Blood Pressure: 120 mmHg     Is BP treated: Yes     HDL Cholesterol: 79.9 mg/dL     Total Cholesterol: 239  mg/dL    Depression screen [facility] 2/9 05/04/2020 04/14/2019 04/08/2018  Decreased Interest 0 1 0  Down, Depressed, Hopeless 0 0 0  PHQ - 2 Score 0 1 0  Altered sleeping - 1 -  Tired, decreased energy - 0 -  Change in appetite - 0 -  Feeling bad or failure about yourself  - 0 -  Trouble concentrating - 0 -  Moving slowly or fidgety/restless - 0 -  Suicidal thoughts - 0 -  PHQ-9 Score - 2 -     Social History   Tobacco Use  Smoking Status Never  Smokeless Tobacco Never   BP Readings from Last 3 Encounters:  05/06/21 120/74  03/15/21 130/76  12/20/20 132/72   Pulse Readings from Last 3 Encounters:  05/06/21 68  03/15/21 64  12/20/20 62   Wt Readings from Last 3 Encounters:  05/06/21 127 lb (57.6 kg)  03/15/21 131 lb (59.4 kg)  12/20/20 130 lb 9.6 oz (59.2 kg)    Assessment/Interventions: Review of patient past medical history, allergies, medications, health status, including review of consultants reports, laboratory and other test data, was performed as part of comprehensive evaluation and provision of chronic care management services.   SDOH:  (Social Determinants of Health) assessments and interventions performed: Yes    CCM Care Plan  Allergies  Allergen Reactions   Levofloxacin     REACTION: GI Intolerance as "horrible stomach pain"   Metoprolol Succinate     REACTION: Tachycardia   Penicillins     REACTION: yeast   Sulfonamide Derivatives     REACTION: itching    Medications Reviewed Today     Reviewed by Pincus Sanes, MD (Physician) on 05/05/21 at 1651  Med List Status: <None>   Medication Order Taking? Sig Documenting Provider Last Dose Status Informant  amLODipine (NORVASC) 5 MG tablet [id number]  TAKE 1 TABLET(5 MG) BY MOUTH DAILY  Patient taking differently: Take 5 mg by mouth in the morning and at bedtime.   Pincus Sanes, MD  Active   Bacillus Coagulans-Inulin (ALIGN PREBIOTIC-PROBIOTIC PO) [id number]  Take by mouth. [provider]   Active   Calcium-Magnesium-Vitamin D (CALCIUM MAGNESIUM PO) [id number]  Take by mouth. [provider]  Active   doxycycline (ADOXA) 75 MG tablet [id number]  Take by mouth. [provider]  Active   estradiol (ESTRACE) 0.5 MG tablet [id number]  Take 0.5 mg by mouth daily. [provider]  Active   famotidine-calcium carbonate-magnesium hydroxide (PEPCID COMPLETE) 10-800-165 MG chewable tablet [id number]  Chew 1 tablet by mouth daily as needed. Pincus Sanes, MD  Active  ferrous sulfate 325 (65 FE) MG tablet [id number]  Take 325 mg by mouth daily. Take 1/2 daily [provider]  Active   fluticasone (FLONASE) 50 MCG/ACT nasal spray [id number]  Place 2 sprays into both nostrils at bedtime. [provider]  Active   Homeopathic Products (LEG CRAMPS PO) [id number]  Take by mouth. [provider]  Active   Homeopathic Products ([place]) FOAM [id number]  Apply topically. Patient takes for restless legs [provider]  Active   losartan (COZAAR) 25 MG tablet [id number]  TAKE 1 TABLET(25 MG) BY MOUTH DAILY Burns, Claudina Lick, MD  Active   meloxicam (MOBIC) 15 MG tablet [id number]  TAKE 1 TABLET(15 MG) BY MOUTH DAILY Burns, Claudina Lick, MD  Active   metroNIDAZOLE (METROCREAM) 0.75 % cream [id number]  Apply topically daily. [provider]  Active   rOPINIRole (REQUIP) 0.25 MG tablet [id number]  Take 3 tablets (0.75 mg total) by mouth at bedtime. Binnie Rail, MD  Active   vitamin B-12 (CYANOCOBALAMIN) 1000 MCG tablet [id number]  Take 1,000 mcg by mouth daily. 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   Snoring 01/17/2012   Hyperlipidemia 08/02/2009   Essential hypertension 08/02/2009   Osteopenia 08/02/2009   GERD 02/11/1996   Diverticulosis of large intestine 02/11/1996    Immunization History  Administered Date(s) Administered   Fluad Quad(high Dose 65+) 04/14/2019   Influenza Whole 06/22/2011   Influenza, High Dose Seasonal PF 06/26/2013, 06/24/2015, 06/06/2016, 05/09/2018   Influenza,inj,Quad PF,6+ Mos 04/30/2014   Influenza-Unspecified 06/10/2017, 04/13/2018, 05/24/2020   Moderna Sars-Covid-2 Vaccination 09/03/2019, 10/01/2019, 06/28/2020   Pneumococcal Conjugate-13 07/28/2015   Pneumococcal Polysaccharide-23 08/02/2009   Td 08/22/2003   Tdap 02/02/2015   Zoster Recombinat (Shingrix) 01/14/2017, 06/27/2017   Zoster, Live 11/18/2007    Conditions to be addressed/monitored:  Hypertension, GERD, Osteopenia and Postmenopausal syndrome, RLS  There are no care plans that you recently modified to display for this patient.     Medication Assistance: None required.  Patient affirms current coverage meets needs.  Patient's preferred pharmacy is:  [id number] - [address], [place] [address] Phone: [phone] Fax: [phone]  Uses pill box? Yes Pt endorses 100% compliance  We discussed: Current pharmacy is preferred with insurance plan and patient is satisfied with pharmacy services Patient decided to: Continue current medication management strategy  Care Plan and Follow Up Patient Decision:  Patient agrees to Care Plan and Follow-up.  Plan: Telephone follow up appointment with care management team member scheduled for:  1 year  ***

## 2021-10-25 ENCOUNTER — Telehealth: Payer: Self-pay | Admitting: Internal Medicine

## 2021-10-25 NOTE — Telephone Encounter (Signed)
LM with patients husband to have pt rtn my call. To schedule awv with nha.  ?

## 2021-11-02 ENCOUNTER — Other Ambulatory Visit: Payer: Self-pay | Admitting: Internal Medicine

## 2021-11-03 ENCOUNTER — Other Ambulatory Visit: Payer: Self-pay | Admitting: Internal Medicine

## 2021-11-07 ENCOUNTER — Ambulatory Visit (INDEPENDENT_AMBULATORY_CARE_PROVIDER_SITE_OTHER): Payer: Medicare Other

## 2021-11-07 ENCOUNTER — Other Ambulatory Visit: Payer: Self-pay

## 2021-11-07 VITALS — BP 126/64 | HR 67 | Temp 98.2°F | Ht 62.0 in | Wt 139.0 lb

## 2021-11-07 DIAGNOSIS — Z0000 Encounter for general adult medical examination without abnormal findings: Secondary | ICD-10-CM

## 2021-11-07 NOTE — Progress Notes (Signed)
? ?Subjective:  ? Ruth Jones is a 76 y.o. female who presents for Medicare Annual (Subsequent) preventive examination. ? ?Review of Systems    ? ?Cardiac Risk Factors include: advanced age (>53mn, >>72women);hypertension;dyslipidemia;family history of premature cardiovascular disease ? ?   ?Objective:  ?  ?Today's Vitals  ? 11/07/21 1428  ?BP: 126/64  ?Pulse: 67  ?Temp: 98.2 ?F (36.8 ?C)  ?TempSrc: Temporal  ?SpO2: 97%  ?Weight: 139 lb (63 kg)  ?Height: '5\' 2"'$  (1.575 m)  ? ?Body mass index is 25.42 kg/m?. ? ?Advanced Directives 11/07/2021 06/08/2020 04/08/2018  ?Does Patient Have a Medical Advance Directive? Yes Yes No  ?Type of Advance Directive Living will;Healthcare Power of Attorney - -  ?Does patient want to make changes to medical advance directive? No - Patient declined - -  ?Copy of HRichardsonin Chart? No - copy requested - -  ?Would patient like information on creating a medical advance directive? - - Yes (ED - Information included in AVS)  ? ? ?Current Medications (verified) ?Outpatient Encounter Medications as of 11/07/2021  ?Medication Sig  ? amLODipine (NORVASC) 5 MG tablet TAKE 1 TABLET(5 MG) BY MOUTH IN THE MORNING AND AT BEDTIME (Patient taking differently: 5 mg daily. morning)  ? Bacillus Coagulans-Inulin (ALIGN PREBIOTIC-PROBIOTIC PO) Take by mouth.  ? Black Pepper-Turmeric (TURMERIC COMPLEX/BLACK PEPPER PO) Take 1,000 mg by mouth. 1 capsule per day  ? Calcium-Magnesium-Vitamin D (CALCIUM MAGNESIUM PO) Take by mouth.  ? doxycycline (ADOXA) 75 MG tablet Take by mouth. rosea  ? estradiol (ESTRACE) 0.5 MG tablet Take 0.5 mg by mouth daily.  ? famotidine-calcium carbonate-magnesium hydroxide (PEPCID COMPLETE) 10-800-165 MG chewable tablet Chew 1 tablet by mouth daily as needed.  ? ferrous sulfate 325 (65 FE) MG tablet Take 325 mg by mouth daily. Take 1/2 daily  ? Homeopathic Products (LEG CRAMPS PO) Take by mouth. For restful leg  ? Homeopathic Products ([place] FOAM  Apply topically. Patient takes for restless legs  ? losartan (COZAAR) 25 MG tablet TAKE 1 TABLET(25 MG) BY MOUTH DAILY  ? meloxicam (MOBIC) 15 MG tablet TAKE 1 TABLET(15 MG) BY MOUTH DAILY  ? metroNIDAZOLE (METROCREAM) 0.75 % cream Apply topically daily.  ? rOPINIRole (REQUIP) 0.25 MG tablet TAKE 3 TABLETS(0.75 MG) BY MOUTH AT BEDTIME  ? UNABLE TO FIND 1 capsule. Med Name: women's daily probiotic trunature  ? vitamin B-12 (CYANOCOBALAMIN) 1000 MCG tablet Take 1,000 mcg by mouth daily.  ? fluticasone (FLONASE) 50 MCG/ACT nasal spray Place 2 sprays into both nostrils at bedtime. (Patient not taking: Reported on 11/07/2021)  ? ?No facility-administered encounter medications on file as of 11/07/2021.  ? ? ?Allergies (verified) ?Levofloxacin, Metoprolol succinate, Penicillins, and Sulfonamide derivatives  ? ?History: ?Past Medical History:  ?Diagnosis Date  ? Colon polyps   ? GERD (gastroesophageal reflux disease)   ? Hyperlipidemia   ? Osteopenia   ? ?Past Surgical History:  ?Procedure Laterality Date  ? COLONOSCOPY W/ POLYPECTOMY    ? MOHS SURGERY  2016  ? melanoma RLE  ? UPPER GASTROINTESTINAL ENDOSCOPY  2011  ? Dr PHenrene Pastor ? VESICOVAGINAL FISTULA CLOSURE W/ TAH  1990  ? ?Family History  ?Problem Relation Age of Onset  ? Heart disease Father   ? Osteoporosis Mother   ? Lymphoma Mother   ?     non-hodgkins  ? Stroke Neg Hx   ? Diabetes Neg Hx   ? Colon cancer Neg Hx   ? ?Social History  ? ?  Socioeconomic History  ? Marital status: Married  ?  Spouse name: Not on file  ? Number of children: 2  ? Years of education: Not on file  ? Highest education level: Not on file  ?Occupational History  ? Not on file  ?Tobacco Use  ? Smoking status: Never  ? Smokeless tobacco: Never  ?Vaping Use  ? Vaping Use: Never used  ?Substance and Sexual Activity  ? Alcohol use: Yes  ?  Comment:  10-12 glasses of wine weekly   ? Drug use: No  ? Sexual activity: Not Currently  ?Other Topics Concern  ? Not on file  ?Social History Narrative  ? Right  handed  ? Two story home  ? Drinks caffeine  ? ?Social Determinants of Health  ? ?Financial Resource Strain: Low Risk   ? Difficulty of Paying Living Expenses: Not hard at all  ?Food Insecurity: No Food Insecurity  ? Worried About Charity fundraiser in the Last Year: Never true  ? Ran Out of Food in the Last Year: Never true  ?Transportation Needs: No Transportation Needs  ? Lack of Transportation (Medical): No  ? Lack of Transportation (Non-Medical): No  ?Physical Activity: Sufficiently Active  ? Days of Exercise per Week: 7 days  ? Minutes of Exercise per Session: 30 min  ?Stress: No Stress Concern Present  ? Feeling of Stress : Not at all  ?Social Connections: Socially Integrated  ? Frequency of Communication with Friends and Family: More than three times a week  ? Frequency of Social Gatherings with Friends and Family: More than three times a week  ? Attends Religious Services: More than 4 times per year  ? Active Member of Clubs or Organizations: Yes  ? Attends Archivist Meetings: More than 4 times per year  ? Marital Status: Married  ? ? ?Tobacco Counseling ?Counseling given: Not Answered ? ? ?Clinical Intake: ? ?Pre-visit preparation completed: Yes ? ?Pain : No/denies pain ? ?  ? ?BMI - recorded: 25.42 ?Nutritional Status: BMI 25 -29 Overweight ?Nutritional Risks: None ?Diabetes: No ? ?How often do you need to have someone help you when you read instructions, pamphlets, or other written materials from your doctor or pharmacy?: 1 - Never ?What is the last grade level you completed in school?: 2 years of Graduate School ? ?Diabetic? no ? ?Interpreter Needed?: No ? ?Information entered by :: Lisette Abu, LPN ? ? ?Activities of Daily Living ?In your present state of health, do you have any difficulty performing the following activities: 11/07/2021  ?Hearing? N  ?Vision? N  ?Difficulty concentrating or making decisions? N  ?Walking or climbing stairs? N  ?Dressing or bathing? N  ?Doing errands,  shopping? N  ?Preparing Food and eating ? N  ?Using the Toilet? N  ?In the past six months, have you accidently leaked urine? N  ?Do you have problems with loss of bowel control? N  ?Managing your Medications? N  ?Managing your Finances? N  ?Housekeeping or managing your Housekeeping? N  ?Some recent data might be hidden  ? ? ?Patient Care Team: ?Binnie Rail, MD as PCP - General (Internal Medicine) ?Foltanski, Cleaster Corin, [facility] as Pharmacist (Pharmacist) ?Calvert Cantor, MD as Consulting Physician (Ophthalmology) ? ?Indicate any recent Medical Services you may have received from other than [facility] providers in the past year (date may be approximate). ? ?   ?Assessment:  ? This is a routine wellness examination for [facility]. ? ?Hearing/Vision screen ?Hearing Screening -  Comments:: Patient denied any hearing difficulty.   ?No hearing aids. ? ?Vision Screening - Comments:: Patient does wear corrective lenses/contacts.   ?Eye exam done by: [facility] ? ?Dietary issues and exercise activities discussed: ?Current Exercise Habits: Home exercise routine, Type of exercise: walking;treadmill;stretching;strength training/weights, Time (Minutes): 30, Frequency (Times/Week): 7, Weekly Exercise (Minutes/Week): 210, Intensity: Moderate, Exercise limited by: Other - see comments (RLS) ? ? Goals Addressed   ? ?  ?  ?  ?  ? This Visit's Progress  ?  Patient Stated     ?  My goal is to lose 5 pounds. ?  ? ?  ?Depression Screen ?PHQ 2/9 Scores 11/07/2021 05/04/2020 04/14/2019 04/08/2018 04/06/2017 04/04/2016 08/24/2014  ?PHQ - 2 Score 0 0 1 0 0 0 0  ?PHQ- 9 Score - - 2 - - - -  ?  ?Fall Risk ?Fall Risk  11/07/2021 06/08/2020 05/04/2020 04/08/2018 04/04/2016  ?Falls in the past year? 0 0 0 No No  ?Number falls in past yr: 0 0 0 - -  ?Injury with Fall? 0 0 0 - -  ?Risk for fall due to : No Fall Risks - - - -  ?Follow up Falls evaluation completed - - - -  ? ? ?FALL RISK PREVENTION PERTAINING TO THE HOME: ? ?Any stairs in or around the home? Yes   ?If so, are there any without handrails? No  ?Home free of loose throw rugs in walkways, pet beds, electrical cords, etc? No  ?Adequate lighting in your home to reduce risk of falls? No  ? ?ASSISTIVE DEVICES

## 2021-11-07 NOTE — Patient Instructions (Signed)
Ruth Jones , ?Thank you for taking time to come for your Medicare Wellness Visit. I appreciate your ongoing commitment to your health goals. Please review the following plan we discussed and let me know if I can assist you in the future.  ? ?Screening recommendations/referrals: ?Colonoscopy: discontinued ?Mammogram: 06/24/2019; due every 1-2 years ?Bone Density: 05/24/2020; due every 3 years ?Recommended yearly ophthalmology/optometry visit for glaucoma screening and checkup ?Recommended yearly dental visit for hygiene and checkup ? ?Vaccinations: ?Influenza vaccine: 05/10/2021 ?Pneumococcal vaccine: 08/02/2009, 07/28/2015 ?Tdap vaccine: 02/02/2015; due every 10 years ?Shingles vaccine: 01/14/2017, 06/27/2017   ?Covid-19: 09/03/2019, 10/01/2019, 06/28/2020, 01/11/2021, 05/16/2021 ? ?Advanced directives: Please bring a copy of your health care power of attorney and living will to the office at your convenience. ? ?Conditions/risks identified: Yes; Client understands the importance of follow-up appointments with providers by attending scheduled visits and discussed goals to eat healthier, increase physical activity 5 times a week for 30 minutes each, exercise the brain by doing stimulating brain exercises (reading, adult coloring, crafting, listening to music, puzzles, etc.), socialize and enjoy life more, get enough sleep at least 8-9 hours average per night and make time for laughter. ? ?Next appointment: Please schedule your next Medicare Wellness Visit with your Nurse Health Advisor in 1 year by calling [phone]. ? ? ?Preventive Care 78 Years and Older, Female ?Preventive care refers to lifestyle choices and visits with your health care provider that can promote health and wellness. ?What does preventive care include? ?A yearly physical exam. This is also called an annual well check. ?Dental exams once or twice a year. ?Routine eye exams. Ask your health care provider how often you should have your eyes  checked. ?Personal lifestyle choices, including: ?Daily care of your teeth and gums. ?Regular physical activity. ?Eating a healthy diet. ?Avoiding tobacco and drug use. ?Limiting alcohol use. ?Practicing safe sex. ?Taking low-dose aspirin every day. ?Taking vitamin and mineral supplements as recommended by your health care provider. ?What happens during an annual well check? ?The services and screenings done by your health care provider during your annual well check will depend on your age, overall health, lifestyle risk factors, and family history of disease. ?Counseling  ?Your health care provider may ask you questions about your: ?Alcohol use. ?Tobacco use. ?Drug use. ?Emotional well-being. ?Home and relationship well-being. ?Sexual activity. ?Eating habits. ?History of falls. ?Memory and ability to understand (cognition). ?Work and work Statistician. ?Reproductive health. ?Screening  ?You may have the following tests or measurements: ?Height, weight, and BMI. ?Blood pressure. ?Lipid and cholesterol levels. These may be checked every 5 years, or more frequently if you are over 76 years old. ?Skin check. ?Lung cancer screening. You may have this screening every year starting at age 67 if you have a 30-pack-year history of smoking and currently smoke or have quit within the past 15 years. ?Fecal occult blood test (FOBT) of the stool. You may have this test every year starting at age 35. ?Flexible sigmoidoscopy or colonoscopy. You may have a sigmoidoscopy every 5 years or a colonoscopy every 10 years starting at age 81. ?Hepatitis C blood test. ?Hepatitis B blood test. ?Sexually transmitted disease (STD) testing. ?Diabetes screening. This is done by checking your blood sugar (glucose) after you have not eaten for a while (fasting). You may have this done every 1-3 years. ?Bone density scan. This is done to screen for osteoporosis. You may have this done starting at age 32. ?Mammogram. This may be done every 1-2  years. Talk  to your health care provider about how often you should have regular mammograms. ?Talk with your health care provider about your test results, treatment options, and if necessary, the need for more tests. ?Vaccines  ?Your health care provider may recommend certain vaccines, such as: ?Influenza vaccine. This is recommended every year. ?Tetanus, diphtheria, and acellular pertussis (Tdap, Td) vaccine. You may need a Td booster every 10 years. ?Zoster vaccine. You may need this after age 48. ?Pneumococcal 13-valent conjugate (PCV13) vaccine. One dose is recommended after age 54. ?Pneumococcal polysaccharide (PPSV23) vaccine. One dose is recommended after age 31. ?Talk to your health care provider about which screenings and vaccines you need and how often you need them. ?This information is not intended to replace advice given to you by your health care provider. Make sure you discuss any questions you have with your health care provider. ?Document Released: 09/03/2015 Document Revised: 04/26/2016 Document Reviewed: 06/08/2015 ?Elsevier Interactive Patient Education ? 2017 [place]. ? ?Fall Prevention in the Home ?Falls can cause injuries. They can happen to people of all ages. There are many things you can do to make your home safe and to help prevent falls. ?What can I do on the outside of my home? ?Regularly fix the edges of walkways and driveways and fix any cracks. ?Remove anything that might make you trip as you walk through a door, such as a raised step or threshold. ?Trim any bushes or trees on the path to your home. ?Use bright outdoor lighting. ?Clear any walking paths of anything that might make someone trip, such as rocks or tools. ?Regularly check to see if handrails are loose or broken. Make sure that both sides of any steps have handrails. ?Any raised decks and porches should have guardrails on the edges. ?Have any leaves, snow, or ice cleared regularly. ?Use sand or salt on walking paths  during winter. ?Clean up any spills in your garage right away. This includes oil or grease spills. ?What can I do in the bathroom? ?Use night lights. ?Install grab bars by the toilet and in the tub and shower. Do not use towel bars as grab bars. ?Use non-skid mats or decals in the tub or shower. ?If you need to sit down in the shower, use a plastic, non-slip stool. ?Keep the floor dry. Clean up any water that spills on the floor as soon as it happens. ?Remove soap buildup in the tub or shower regularly. ?Attach bath mats securely with double-sided non-slip rug tape. ?Do not have throw rugs and other things on the floor that can make you trip. ?What can I do in the bedroom? ?Use night lights. ?Make sure that you have a light by your bed that is easy to reach. ?Do not use any sheets or blankets that are too big for your bed. They should not hang down onto the floor. ?Have a firm chair that has side arms. You can use this for support while you get dressed. ?Do not have throw rugs and other things on the floor that can make you trip. ?What can I do in the kitchen? ?Clean up any spills right away. ?Avoid walking on wet floors. ?Keep items that you use a lot in easy-to-reach places. ?If you need to reach something above you, use a strong step stool that has a grab bar. ?Keep electrical cords out of the way. ?Do not use floor polish or wax that makes floors slippery. If you must use wax, use non-skid floor wax. ?Do not  have throw rugs and other things on the floor that can make you trip. ?What can I do with my stairs? ?Do not leave any items on the stairs. ?Make sure that there are handrails on both sides of the stairs and use them. Fix handrails that are broken or loose. Make sure that handrails are as long as the stairways. ?Check any carpeting to make sure that it is firmly attached to the stairs. Fix any carpet that is loose or worn. ?Avoid having throw rugs at the top or bottom of the stairs. If you do have throw  rugs, attach them to the floor with carpet tape. ?Make sure that you have a light switch at the top of the stairs and the bottom of the stairs. If you do not have them, ask someone to add them for you. ?What else can I do to

## 2021-11-13 ENCOUNTER — Encounter: Payer: Self-pay | Admitting: Internal Medicine

## 2021-11-13 NOTE — Progress Notes (Signed)
? ? ? ? ?Subjective:  ? ? Patient ID: Ruth Jones, female    DOB: 08/30/1945, 76 y.o.   MRN: [id number] ? ?This visit occurred during the SARS-CoV-2 public health emergency.  Safety protocols were in place, including screening questions prior to the visit, additional usage of staff PPE, and extensive cleaning of exam room while observing appropriate contact time as indicated for disinfecting solutions.   ? ? ?HPI ?Ruth Jones is here for follow up of her chronic medical problems, including RLS ? ?Currently taking ropinirole 0.75 mg at bedtime.  She also uses 2 over-the-counter medications that help.  1 is a spray and 1 is a gel. ? ?The Requip does help, but some nights she notices that it wears off around 5:00 in the morning, but not always.  She wondered if she should or could increase the dose. ? ?She is also concerned about falling in a couple of months to use up and sitting still the plane.  She would like to be able to bring her topical medications with her because they do help provide some relief. ? ? ?Medications and allergies reviewed with patient and updated if appropriate. ? ?Current Outpatient Medications on File Prior to Visit  ?Medication Sig Dispense Refill  ? amLODipine (NORVASC) 5 MG tablet TAKE 1 TABLET(5 MG) BY MOUTH IN THE MORNING AND AT BEDTIME (Patient taking differently: 5 mg daily. morning) 90 tablet 0  ? Bacillus Coagulans-Inulin (ALIGN PREBIOTIC-PROBIOTIC PO) Take by mouth.    ? Black Pepper-Turmeric (TURMERIC COMPLEX/BLACK PEPPER PO) Take 1,000 mg by mouth. 1 capsule per day    ? Calcium-Magnesium-Vitamin D (CALCIUM MAGNESIUM PO) Take by mouth.    ? doxycycline (ADOXA) 75 MG tablet Take by mouth. rosea    ? estradiol (ESTRACE) 0.5 MG tablet Take 0.5 mg by mouth daily.    ? famotidine-calcium carbonate-magnesium hydroxide (PEPCID COMPLETE) 10-800-165 MG chewable tablet Chew 1 tablet by mouth daily as needed. 60 tablet   ? ferrous sulfate 325 (65 FE) MG tablet Take 325 mg by mouth daily. Take 1/2  daily    ? Homeopathic Products (LEG CRAMPS PO) Take by mouth. For restful leg    ? Homeopathic Products ([place]) FOAM Apply topically. Patient takes for restless legs    ? losartan (COZAAR) 25 MG tablet TAKE 1 TABLET(25 MG) BY MOUTH DAILY 90 tablet 2  ? meloxicam (MOBIC) 15 MG tablet TAKE 1 TABLET(15 MG) BY MOUTH DAILY 90 tablet 1  ? metroNIDAZOLE (METROCREAM) 0.75 % cream Apply topically daily.    ? UNABLE TO FIND 1 capsule. Med Name: women's daily probiotic trunature    ? vitamin B-12 (CYANOCOBALAMIN) 1000 MCG tablet Take 1,000 mcg by mouth daily.    ? fluticasone (FLONASE) 50 MCG/ACT nasal spray Place 2 sprays into both nostrils at bedtime. (Patient not taking: Reported on 11/07/2021)    ? ?No current facility-administered medications on file prior to visit.  ? ? ? ?Review of Systems ? ?   ?Objective:  ? ?Vitals:  ? 11/14/21 1107  ?BP: 140/76  ?Pulse: 74  ?Temp: 98.1 ?F (36.7 ?C)  ?SpO2: 98%  ? ?BP Readings from Last 3 Encounters:  ?11/14/21 140/76  ?11/07/21 126/64  ?05/06/21 120/74  ? ?Wt Readings from Last 3 Encounters:  ?11/14/21 132 lb (59.9 kg)  ?11/07/21 139 lb (63 kg)  ?05/06/21 127 lb (57.6 kg)  ? ?Body mass index is 24.14 kg/m?. ? ?  ?Physical Exam ?Constitutional:   ?   Appearance: Normal appearance.  ?  HENT:  ?   Head: Normocephalic.  ?Skin: ?   General: Skin is warm and dry.  ?Neurological:  ?   Mental Status: She is alert. Mental status is at baseline.  ?Psychiatric:     ?   Mood and Affect: Mood normal.  ? ?   ? ?Lab Results  ?Component Value Date  ? WBC 8.2 05/06/2021  ? HGB 15.8 (H) 05/06/2021  ? HCT 47.2 (H) 05/06/2021  ? PLT 272.0 05/06/2021  ? GLUCOSE 77 05/06/2021  ? CHOL 239 (H) 05/06/2021  ? TRIG 105.0 05/06/2021  ? HDL 79.90 05/06/2021  ? LDLDIRECT 124.8 10/09/2011  ? LDLCALC 138 (H) 05/06/2021  ? ALT 31 05/06/2021  ? AST 31 05/06/2021  ? NA 138 05/06/2021  ? K 4.4 05/06/2021  ? CL 100 05/06/2021  ? CREATININE 1.01 05/06/2021  ? BUN 22 05/06/2021  ? CO2 27 05/06/2021  ? TSH 1.91  05/06/2021  ? ? ? ?Assessment & Plan:  ? ? ?See Problem List for Assessment and Plan of chronic medical problems.  ? ? ?

## 2021-11-14 ENCOUNTER — Other Ambulatory Visit: Payer: Self-pay | Admitting: Internal Medicine

## 2021-11-14 ENCOUNTER — Ambulatory Visit (INDEPENDENT_AMBULATORY_CARE_PROVIDER_SITE_OTHER): Payer: Medicare Other | Admitting: Internal Medicine

## 2021-11-14 ENCOUNTER — Other Ambulatory Visit: Payer: Self-pay

## 2021-11-14 VITALS — BP 140/76 | HR 74 | Temp 98.1°F | Ht 62.0 in | Wt 132.0 lb

## 2021-11-14 DIAGNOSIS — G2581 Restless legs syndrome: Secondary | ICD-10-CM | POA: Diagnosis not present

## 2021-11-14 MED ORDER — ROPINIROLE HCL 0.25 MG PO TABS: ORAL_TABLET | ORAL | 1 refills | Status: DC

## 2021-11-14 MED ORDER — NONFORMULARY OR COMPOUNDED ITEM: 2 refills | Status: AC

## 2021-11-14 MED ORDER — ROPINIROLE HCL 1 MG PO TABS: 1.0000 mg | ORAL_TABLET | Freq: Three times a day (TID) | ORAL | 1 refills | Status: DC

## 2021-11-14 NOTE — Assessment & Plan Note (Signed)
Chronic ?Currently taking Requip 0.75 mg nightly ?RLS not ideally controlled-sometimes she is waking up around 5:00 in the morning ?Increase Requip to 1 mg at night-can try 1.25 mg if that is not effective ?She would benefit from being able to take her topical medications with her on the plane-they are overall the allowed ounces for traveling by TSA-we will write prescriptions and the pharmacist will be able to put a label on it so that she is able to take it with either ?Can also try small dose of Requip during the day before flying ?

## 2021-11-14 NOTE — Patient Instructions (Signed)
? ? ? ? ?  Medications changes include :   increase requip to 1 mg nightly ? ? ?Your prescription(s) have been sent to your pharmacy.  ? ? ?

## 2021-12-05 DIAGNOSIS — L718 Other rosacea: Secondary | ICD-10-CM | POA: Diagnosis not present

## 2022-01-28 ENCOUNTER — Other Ambulatory Visit: Payer: Self-pay | Admitting: Internal Medicine

## 2022-03-27 ENCOUNTER — Encounter: Payer: Self-pay | Admitting: Psychology

## 2022-03-28 ENCOUNTER — Ambulatory Visit: Payer: Medicare Other | Admitting: Psychology

## 2022-03-28 ENCOUNTER — Encounter: Payer: Self-pay | Admitting: Psychology

## 2022-03-28 ENCOUNTER — Ambulatory Visit (INDEPENDENT_AMBULATORY_CARE_PROVIDER_SITE_OTHER): Payer: Medicare Other | Admitting: Psychology

## 2022-03-28 DIAGNOSIS — I6781 Acute cerebrovascular insufficiency: Secondary | ICD-10-CM | POA: Diagnosis not present

## 2022-03-28 DIAGNOSIS — R4189 Other symptoms and signs involving cognitive functions and awareness: Secondary | ICD-10-CM

## 2022-03-28 DIAGNOSIS — G3184 Mild cognitive impairment, so stated: Secondary | ICD-10-CM | POA: Diagnosis not present

## 2022-03-28 HISTORY — DX: Mild cognitive impairment of uncertain or unknown etiology: G31.84

## 2022-03-28 NOTE — Progress Notes (Signed)
   Psychometrician Note   Cognitive testing was administered to Ruth Jones by Milana Kidney, B.S. (psychometrist) under the supervision of Dr. Christia Reading, Ph.D., licensed psychologist on 03/28/2022. Ms. Glade did not appear overtly distressed by the testing session per behavioral observation or responses across self-report questionnaires. Rest breaks were offered.    The battery of tests administered was selected by Dr. Christia Reading, Ph.D. with consideration to Ms. Menta's current level of functioning, the nature of her symptoms, emotional and behavioral responses during interview, level of literacy, observed level of motivation/effort, and the nature of the referral question. This battery was communicated to the psychometrist. Communication between Dr. Christia Reading, Ph.D. and the psychometrist was ongoing throughout the evaluation and Dr. Christia Reading, Ph.D. was immediately accessible at all times. Dr. Christia Reading, Ph.D. provided supervision to the psychometrist on the date of this service to the extent necessary to assure the quality of all services provided.    Ruth Jones will return within approximately 1-2 weeks for an interactive feedback session with Dr. Melvyn Novas at which time her test performances, clinical impressions, and treatment recommendations will be reviewed in detail. Ms. Mclouth understands she can contact our office should she require our assistance before this time.  A total of 125 minutes of billable time were spent face-to-face with Ms. Deloney by the psychometrist. This includes both test administration and scoring time. Billing for these services is reflected in the clinical report generated by Dr. Christia Reading, Ph.D.  This note reflects time spent with the psychometrician and does not include test scores or any clinical interpretations made by Dr. Melvyn Novas. The full report will follow in a separate note.

## 2022-03-28 NOTE — Progress Notes (Unsigned)
NEUROPSYCHOLOGICAL EVALUATION Ruth Jones. [facility] of Neurology  Date of Evaluation: March 28, 2022  Reason for Referral:   Ruth Jones is a 76 y.o. right-handed Caucasian female referred by Metta Clines, D.O., to characterize her current cognitive functioning and assist with diagnostic clarity and treatment planning in the context of subjective cognitive decline.   Assessment and Plan:   Clinical Impression(s): Ruth Jones pattern of performance is suggestive of a primary impairment surrounding semantic fluency and both encoding (i.e., learning) and retrieval aspects of memory. An isolated impairment was exhibited across a line orientation task; however, other visuospatial tasks were appropriate. A relative weakness was noted across phonemic fluency while performance variability was exhibited across executive functioning and recognition/consolidation aspects of memory. Performances were appropriate relative to age-matched peers across processing speed, attention/concentration, safety/judgment, receptive language, and confrontation naming. Ruth Jones denied difficulties completing instrumental activities of daily living (ADLs) independently. As such, given evidence for cognitive dysfunction described above, she meets criteria for a Mild Neurocognitive Disorder ("mild cognitive impairment") at the present time.  The etiology for ongoing dysfunction is unclear and potentially mixed at the present time. Neuroimaging back in 2012 revealed moderately extensive microvascular ischemic disease, suggesting the potential for a significant vascular contribution to current deficits. Patterns of dysfunction surrounding executive functioning, phonemic fluency, and encoding/retrieval aspects of memory could reasonably be seen in this presentation and this certainly remains plausible.  Unfortunately, I cannot rule out early stages of Alzheimer's disease and monitoring over  time regarding this illness will be very important moving forward. Across memory tasks, Ruth Jones did not benefit from repeated exposure to information and was essentially amnestic (0% to 17% retention) across all memory tasks. While she performed adequately across a list and story recognition tasks, figure recognition was quite impaired. Taken together, this could suggest the presence of rapid forgetting (as previously reported by her daughter-in-law) and an evolving memory storage impairment, both of which are hallmark characteristics of this illness. Amnestic memory would be unexpected in a primary vascular etiology caused solely by ischemic changes and would be far more suggestive of a degenerative illness. Impaired semantic fluency aligns with Alzheimer's disease concerns; however, intact confrontation naming is an encouraging finding. As stated above, continued medical monitoring will be important moving forward.   I do not see compelling evidence to warrant concerns surrounding frontotemporal lobar degeneration, Lewy body disease, or another more rare parkinsonian condition.  Recommendations: A repeat neuropsychological evaluation in 12-18 months (or sooner if functional decline is noted) is recommended to assess the trajectory of future cognitive decline should it occur. This will also aid in future efforts towards improved diagnostic clarity.  Ruth Jones should discuss medication options for memory loss and progressive cognitive decline with Dr. Tomi Likens. It is important to highlight that these medications have been shown to slow functional decline in some individuals. There is no current treatment which can stop or reverse cognitive decline when caused by a neurodegenerative illness.  Given that her last brain MRI (that I was able to locate and review) was performed in 2012, updated neuroimaging is also recommended.    Performance across neurocognitive testing is not a strong predictor of an  individual's safety operating a motor vehicle. Should her family wish to pursue a formalized driving evaluation, they could reach out to the following agencies: [employer] in [place]: [phone] Driver Rehabilitative Services: [facility]: [place]: [phone] or [phone]  Should there be further progression  of current deficits over time, Ruth Jones is unlikely to regain any independent living skills lost. Therefore, it is recommended that she remain as involved as possible in all aspects of household chores, finances, and medication management, with supervision to ensure adequate performance. She will likely benefit from the establishment and maintenance of a routine in order to maximize her functional abilities over time.  It will be important for Ruth Jones to have another person with her when in situations where she may need to process information, weigh the pros and cons of different options, and make decisions, in order to ensure that she fully understands and recalls all information to be considered.  Ruth Jones is encouraged to attend to lifestyle factors for brain health (e.g., regular physical exercise, good nutrition habits, regular participation in cognitively-stimulating activities, and general stress management techniques), which are likely to have benefits for both emotional adjustment and cognition. Optimal control of vascular risk factors (including safe cardiovascular exercise and adherence to dietary recommendations) is encouraged. Continued participation in activities which provide mental stimulation and social interaction is also recommended.   Important information should be provided to Ruth Jones in written format in all instances. This information should be placed in a highly frequented and easily visible location within her home to promote recall. External strategies such as written notes in a  consistently used memory journal, visual and nonverbal auditory cues such as a calendar on the refrigerator or appointments with alarm, such as on a cell phone, can also help maximize recall.  To address problems with executive dysfunction she may wish to consider:   -Avoiding external distractions when needing to concentrate   -Limiting exposure to fast paced environments with multiple sensory demands   -Writing down complicated information and using checklists   -Attempting and completing one task at a time (i.e., no multi-tasking)   -Verbalizing aloud each step of a task to maintain focus   -Reducing the amount of information considered at one time  Review of Records:   Ruth Jones was seen by [facility] Metta Clines, D.O.) on 06/08/2020 for symptoms of restless leg syndrome. About a month prior, she developed bilateral leg discomfort. This was described as "electrodes going off in my legs," as well as aching of her legs. Symptoms were noted while lying in bed and they can both keep her awake at night or wake her up in the middle of the night. When she gets out of bed to apply OTC ointment, her legs feel heavy like they are going to buckle. She also described some non-radiating back pain after strenuous activity but no numbness or paresthesias. Her PCP started her on ropinirole which did help.  She most recently saw Dr. Tomi Likens on 12/20/2020 for follow-up. Labs performed during her prior appointment in October suggested a vitamin B12 and Ferritin deficiency. Repeat labs on 12/12/2020 demonstrated appropriate B12 levels after supplementation; however Ferritin levels were actually lower. During this visit, Ruth Jones's daughter-in-law expressed some concern surrounding short-term memory, primarily her asking repetitive questions. ADLs were described as intact. Ultimately, Ruth Jones was referred for a comprehensive neuropsychological evaluation to characterize her cognitive abilities and to  assist with diagnostic clarity and treatment planning.   Head CT on 12/23/2001 in the context of head trauma was negative. Brain MRI on 11/12/2010 revealed age-appropriate atrophy and moderately extensive microvascular ischemic disease. No more recent neuroimaging was available for review.   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 1 capsule per day, Disp: , Rfl:    Calcium-Magnesium-Vitamin D (CALCIUM MAGNESIUM PO), Take by mouth., Disp: , Rfl:    doxycycline (ADOXA) 75 MG tablet, Take by mouth. rosea, Disp: , Rfl:    estradiol (ESTRACE) 0.5 MG tablet, Take 0.5 mg by mouth daily., Disp: , Rfl:    famotidine-calcium  carbonate-magnesium hydroxide (PEPCID COMPLETE) 10-800-165 MG chewable tablet, Chew 1 tablet by mouth daily as needed., Disp: 60 tablet, Rfl:    ferrous sulfate 325 (65 FE) MG tablet, Take 325 mg by mouth daily. Take 1/2 daily, Disp: , Rfl:    fluticasone (FLONASE) 50 MCG/ACT nasal spray, Place 2 sprays into both nostrils at bedtime. (Patient not taking: Reported on 11/07/2021), Disp: , Rfl:    Homeopathic Products (LEG CRAMPS PO), Take by mouth. For restful leg, Disp: , Rfl:    Homeopathic Products (THERAWORX RELIEF) FOAM, Apply topically. Patient takes for restless legs, Disp: , Rfl:    losartan (COZAAR) 25 MG tablet, TAKE 1 TABLET(25 MG) BY MOUTH DAILY, Disp: 90 tablet, Rfl: 2   meloxicam (MOBIC) 15 MG tablet, TAKE 1 TABLET(15 MG) BY MOUTH DAILY, Disp: 90 tablet, Rfl: 1   metroNIDAZOLE (METROCREAM) 0.75 % cream, Apply topically daily., Disp: , Rfl:    NONFORMULARY OR COMPOUNDED ITEM, Relaxing Leg Cream  Rub on legs twice a day and as needed for RLS Disp 4 oz container, Disp: 1 each, Rfl: 2   NONFORMULARY OR COMPOUNDED ITEM, Homeopathic muscle cramp & spasm spray Spray onto legs twice daily and as needed Disp 7.1 fl oz, Disp: 1 each, Rfl: 2   rOPINIRole (REQUIP) 0.25 MG tablet, TAKE 3 TABLETS(0.75 MG) BY MOUTH AT BEDTIME, Disp: 270 tablet, Rfl: 1   rOPINIRole (REQUIP) 1 MG tablet, TAKE 1 TABLET(1 MG) BY MOUTH THREE TIMES DAILY, Disp: 270 tablet, Rfl: 2   UNABLE TO FIND, 1 capsule. Med Name: women's daily probiotic trunature, Disp: , Rfl:    vitamin B-12 (CYANOCOBALAMIN) 1000 MCG tablet, Take 1,000 mcg by mouth daily., Disp: , Rfl:   Clinical Interview:   The following information was obtained during a clinical interview with Ruth Jones prior to cognitive testing.  Cognitive Symptoms: Decreased short-term memory: Largely denied. She did describe some instances where she will mix up details of previous conversation and have to ask clarifying questions. However, she denied complete forgetting of  past conversations or more significant memory concerns. More noticeable memory changes were said to be present for the past six or so months and she was unsure if they had worsened over time. Medical records suggest her daughter-in-law raising concerns regarding Ruth Jones asking repetitive questions in May 2022.  Decreased long-term memory: Denied. Decreased attention/concentration: Denied. Reduced processing speed: Denied. Difficulties with executive functions: Denied. She also denied trouble with impulsivity or any significant personality changes.  Difficulties with emotion regulation: Denied. Difficulties with receptive language: Denied. Difficulties with word finding: Denied. Decreased visuoperceptual ability: Denied.  Difficulties completing ADLs: Denied.  Additional Medical History: History of traumatic brain injury/concussion: Denied. History of stroke: Denied. History of seizure activity: Denied. History of known exposure to toxins: Denied. Symptoms of chronic pain: Denied. Experience of frequent headaches/migraines: Denied. Frequent instances of dizziness/vertigo: Denied.  Sensory changes: Denied.  Balance/coordination difficulties: Denied. She also denied any recent falls.  Other motor difficulties: Denied.  Sleep History: Estimated hours obtained each night: 7-8 hours.  Difficulties falling asleep: Denied. Difficulties staying asleep: Denied. Feels rested and refreshed upon awakening: Endorsed.  History of snoring: Denied. Medical records do mention prior snoring behaviors.  History of waking up gasping for air: Denied. Witnessed breath cessation while asleep: Denied.  History of vivid dreaming: Denied. Excessive movement while asleep: Endorsed. Symptoms were attributed to restless leg syndrome. She denied instances of hitting, kicking, or fighting in her sleep.  Instances of acting out her dreams: Denied.  Psychiatric/Behavioral Health History: Depression: She  described her mood as level and even-keeled. She denied to her knowledge any prior mental health concerns or diagnoses. Current or remote suicidal ideation, intent, or plan was denied.  Anxiety: Denied. Mania: Denied. Trauma History: Denied. Visual/auditory hallucinations: Denied. Delusional thoughts: Denied.  Tobacco: Denied. Alcohol: She reported consuming a glass of wine with dinner nightly and denied a history of problematic alcohol abuse or dependence.  Recreational drugs: Denied.  Family History: Problem Relation Age of Onset   Heart disease Father    Osteoporosis Mother    Lymphoma Mother        non-hodgkin's   Stroke Neg Hx    Diabetes Neg Hx    Colon cancer Neg Hx    This information was confirmed by Ruth Jones.  Academic/Vocational History: Highest level of educational attainment: 16 years. She graduated from high school and earned a Dietitian in elementary education. She reported completing a few graduate level courses but not enough to count for a year of additional schooling. She described herself as a good (A/B) student in academic settings. No relative weaknesses were identified.  History of developmental delay: Denied. History of grade repetition: Denied. Enrollment in special education courses: Denied. History of LD/ADHD: Denied.  Employment: Retired. She previously worked as a Pharmacist, hospital.  Evaluation Results:   Behavioral Observations: Ruth Jones was unaccompanied, arrived to her appointment on time, and was appropriately dressed and groomed. She appeared alert and oriented. Observed gait and station were within normal limits. Gross motor functioning appeared intact upon informal observation and no abnormal movements (e.g., tremors) were noted. Her affect was generally relaxed and positive, but did range appropriately given the subject being discussed during the clinical interview or the task at hand during testing procedures. Spontaneous speech was  fluent and word finding difficulties  were not observed during the clinical interview. Thought processes were coherent, organized, and normal in content. Insight into her cognitive difficulties appeared limited. Despite Ruth Jones denying all cognitive concerns during interview, objective testing did reveal ongoing deficits, especially with regard to memory functioning, suggesting that she may not have appropriate insight into the extent of this limitation.   During testing, sustained attention was appropriate. Task engagement was adequate and she persisted when challenged. Overall, Ruth Jones was cooperative with the clinical interview and subsequent testing procedures.   Adequacy of Effort: The validity of neuropsychological testing is limited by the extent to which the individual being tested may be assumed to have exerted adequate effort during testing. Ruth Jones expressed her intention to perform to the best of her abilities and exhibited adequate task engagement and persistence. Scores across stand-alone and embedded performance validity measures were within expectation. As such, the results of the current evaluation are believed to be a valid representation of Ruth Jones's current cognitive functioning.  Test Results: Ruth Jones. Connon was largely oriented at the time of the current evaluation. Points were lost for her being two days off when stating the current date.   Intellectual abilities based upon educational and vocational attainment were estimated to be in the average range. Premorbid abilities were estimated to be within the average range based upon a single-word reading test.   Processing speed was average to above average. Basic attention was variable but overall appropriate, ranging from the below average to above average normative ranges. More complex attention (e.g., working memory) was average. Executive functioning was variable, ranging from the well below average to average  normative ranges. She did perform in the well above average range across a task assessing safety and judgment.  Assessed receptive language abilities were above average. Likewise, Ruth Jones. Rollo did not exhibit any difficulties comprehending task instructions and answered all questions asked of her appropriately. Assessed expressive language was variable. Phonemic fluency was well below average to below average, semantic fluency was exceptionally low to well below average, and confrontation naming was average to above average.    Assessed visuospatial/visuoconstructional abilities were average outside of poor performance across a line orientation task.    Learning (i.e., encoding) of novel verbal information was well below average. Spontaneous delayed recall (i.e., retrieval) of previously learned information was exceptionally low. Retention rates were 17% (raw score of 1) across a story learning task, 0% across a list learning task, and 0% across a figure drawing task. Performance across recognition tasks was variable, ranging from the exceptionally low to average normative ranges, suggesting some limited evidence for information consolidation.   Results of emotional screening instruments suggested that recent symptoms of generalized anxiety were in the minimal range, while symptoms of depression were within normal limits. A screening instrument assessing recent sleep quality suggested the presence of minimal sleep dysfunction.  Tables of Scores:   Note: This summary of test scores accompanies the interpretive report and should not be considered in isolation without reference to the appropriate sections in the text. Descriptors are based on appropriate normative data and may be adjusted based on clinical judgment. Terms such as "Within Normal Limits" and "Outside Normal Limits" are used when a more specific description of the test score cannot be determined.       Percentile - Normative Descriptor > 98  - Exceptionally High 91-97 - Well Above Average 75-90 - Above Average 25-74 - Average 9-24 - Below Average 2-8 - Well Below Average < 2 -  Exceptionally Low       Validity:   DESCRIPTOR       Dot Counting Test: --- --- Within Normal Limits  RBANS Effort Index: --- --- Within Normal Limits  WAIS-IV Reliable Digit Span: --- --- Within Normal Limits       Orientation:      Raw Score Percentile   NAB Orientation, Form 1 27/29 --- ---       Cognitive Screening:      Raw Score Percentile   SLUMS: 20/30 --- ---       RBANS, Form A: Standard Score/ Scaled Score Percentile   Total Score 75 5 Well Below Average  Immediate Memory 69 2 Well Below Average    List Learning 4 2 Well Below Average    Story Memory 5 5 Well Below Average  Visuospatial/Constructional 84 14 Below Average    Figure Copy 10 50 Average    Line Orientation 11/20 3-9 Well Below Average  Language 88 21 Below Average    Picture Naming 10/10 51-75 Average    Semantic Fluency 5 5 Well Below Average  Attention 100 50 Average    Digit Span 12 75 Above Average    Coding 8 25 Average  Delayed Memory 60 <1 Exceptionally Low    List Recall 0/10 <2 Exceptionally Low    List Recognition 18/20 17-25 Below Average to Average    Story Recall 2 <1 Exceptionally Low    Story Recognition 8/12 8-15 Below Average    Figure Recall 1 <1 Exceptionally Low    Figure Recognition 0/8 1 Exceptionally Low        Intellectual Functioning:      Standard Score Percentile   Test of Premorbid Functioning: 97 42 Average       Attention/Executive Function:     Trail Making Test (TMT): Raw Score (T Score) Percentile     Part A 31 secs.,  0 errors (53) 62 Average    Part B 79 secs.,  0 errors (52) 58 Average         Scaled Score Percentile   WAIS-IV Digit Span: 9 37 Average    Forward 7 16 Below Average    Backward 10 50 Average    Sequencing 10 50 Average        Scaled Score Percentile   WAIS-IV Similarities: 10 50 Average        D-KEFS Color-Word Interference Test: Raw Score (Scaled Score) Percentile     Color Naming 28 secs. (12) 75 Above Average    Word Reading 23 secs. (11) 63 Average    Inhibition 72 secs. (10) 50 Average      Total Errors 5 errors (8) 25 Average    Inhibition/Switching 95 secs. (8) 25 Average      Total Errors 2 errors (11) 63 Average       D-KEFS Verbal Fluency Test: Raw Score (Scaled Score) Percentile     Letter Total Correct 23 (6) 9 Below Average    Category Total Correct 19 (4) 2 Well Below Average    Category Switching Total Correct 8 (5) 5 Well Below Average    Category Switching Accuracy 7 (6) 9 Below Average      Total Set Loss Errors 0 (13) 84 Above Average      Total Repetition Errors 3 (10) 50 Average       NAB Executive Functions Module, Form 1: T Score Percentile     Judgment 66 95 Well Above Average  Language:      Raw Score Percentile   Sentence Repetition: 13/22 11 Below Average       Verbal Fluency Test: Raw Score (T Score) Percentile     Phonemic Fluency (FAS) 23 (33) 5 Well Below Average    Animal Fluency 8 (21) <1 Exceptionally Low        NAB Language Module, Form 1: T Score Percentile     Auditory Comprehension 57 75 Above Average    Naming 30/31 (59) 82 Above Average       Visuospatial/Visuoconstruction:      Raw Score Percentile   Clock Drawing: 10/10 --- Within Normal Limits        Scaled Score Percentile   WAIS-IV Block Design: 10 50 Average       Mood and Personality:      Raw Score Percentile   Geriatric Depression Scale: 8 --- Within Normal Limits  Geriatric Anxiety Scale: 8 --- Minimal    Somatic 3 --- Minimal    Cognitive 1 --- Minimal    Affective 4 --- Mild       Additional Questionnaires:      Raw Score Percentile   PROMIS Sleep Disturbance Questionnaire: 12 --- None to Slight   Informed Consent and Coding/Compliance:   The current evaluation represents a clinical evaluation for the purposes previously outlined by the  referral source and is in no way reflective of a forensic evaluation.   Ruth Jones. Cupples was provided with a verbal description of the nature and purpose of the present neuropsychological evaluation. Also reviewed were the foreseeable risks and/or discomforts and benefits of the procedure, limits of confidentiality, and mandatory reporting requirements of this provider. The patient was given the opportunity to ask questions and receive answers about the evaluation. Oral consent to participate was provided by the patient.   This evaluation was conducted by Christia Reading, Ph.D., ABPP-CN, board certified clinical neuropsychologist. Ruth Jones. Zuccaro completed a clinical interview with Dr. Melvyn Novas, billed as one unit [phone], and 125 minutes of cognitive testing and scoring, billed as one unit [id number] and three additional units [id number]. Psychometrist Milana Kidney, B.S., assisted Dr. Melvyn Novas with test administration and scoring procedures. As a separate and discrete service, Dr. Melvyn Novas spent a total of 160 minutes in interpretation and report writing billed as one unit [phone] and two units 96133.

## 2022-03-29 ENCOUNTER — Encounter: Payer: Self-pay | Admitting: Psychology

## 2022-04-04 ENCOUNTER — Encounter: Payer: Medicare Other | Admitting: Psychology

## 2022-04-06 ENCOUNTER — Encounter: Payer: Self-pay | Admitting: Psychology

## 2022-04-06 ENCOUNTER — Encounter: Payer: Medicare Other | Admitting: Psychology

## 2022-04-27 DIAGNOSIS — H04123 Dry eye syndrome of bilateral lacrimal glands: Secondary | ICD-10-CM | POA: Diagnosis not present

## 2022-04-27 DIAGNOSIS — H524 Presbyopia: Secondary | ICD-10-CM | POA: Diagnosis not present

## 2022-04-27 DIAGNOSIS — D3131 Benign neoplasm of right choroid: Secondary | ICD-10-CM | POA: Diagnosis not present

## 2022-04-27 DIAGNOSIS — H43813 Vitreous degeneration, bilateral: Secondary | ICD-10-CM | POA: Diagnosis not present

## 2022-04-27 DIAGNOSIS — H35373 Puckering of macula, bilateral: Secondary | ICD-10-CM | POA: Diagnosis not present

## 2022-04-30 ENCOUNTER — Other Ambulatory Visit: Payer: Self-pay | Admitting: Internal Medicine

## 2022-05-09 DIAGNOSIS — L821 Other seborrheic keratosis: Secondary | ICD-10-CM | POA: Diagnosis not present

## 2022-05-09 DIAGNOSIS — Z85828 Personal history of other malignant neoplasm of skin: Secondary | ICD-10-CM | POA: Diagnosis not present

## 2022-05-09 DIAGNOSIS — Z85820 Personal history of malignant melanoma of skin: Secondary | ICD-10-CM | POA: Diagnosis not present

## 2022-05-09 DIAGNOSIS — L565 Disseminated superficial actinic porokeratosis (DSAP): Secondary | ICD-10-CM | POA: Diagnosis not present

## 2022-05-09 DIAGNOSIS — L570 Actinic keratosis: Secondary | ICD-10-CM | POA: Diagnosis not present

## 2022-05-09 DIAGNOSIS — D485 Neoplasm of uncertain behavior of skin: Secondary | ICD-10-CM | POA: Diagnosis not present

## 2022-05-09 DIAGNOSIS — D225 Melanocytic nevi of trunk: Secondary | ICD-10-CM | POA: Diagnosis not present

## 2022-05-31 ENCOUNTER — Other Ambulatory Visit: Payer: Self-pay | Admitting: Internal Medicine

## 2022-06-02 ENCOUNTER — Other Ambulatory Visit: Payer: Self-pay | Admitting: Internal Medicine

## 2022-07-12 DIAGNOSIS — D485 Neoplasm of uncertain behavior of skin: Secondary | ICD-10-CM | POA: Diagnosis not present

## 2022-07-12 DIAGNOSIS — L110 Acquired keratosis follicularis: Secondary | ICD-10-CM | POA: Diagnosis not present

## 2022-08-17 DIAGNOSIS — I10 Essential (primary) hypertension: Secondary | ICD-10-CM | POA: Diagnosis not present

## 2022-08-17 DIAGNOSIS — R058 Other specified cough: Secondary | ICD-10-CM | POA: Diagnosis not present

## 2022-08-17 DIAGNOSIS — R051 Acute cough: Secondary | ICD-10-CM | POA: Diagnosis not present

## 2022-08-17 DIAGNOSIS — Z6824 Body mass index (BMI) 24.0-24.9, adult: Secondary | ICD-10-CM | POA: Diagnosis not present

## 2022-08-24 ENCOUNTER — Ambulatory Visit: Payer: Medicare Other | Admitting: Internal Medicine

## 2022-09-21 DIAGNOSIS — Z1231 Encounter for screening mammogram for malignant neoplasm of breast: Secondary | ICD-10-CM | POA: Diagnosis not present

## 2022-09-21 LAB — HM MAMMOGRAPHY

## 2022-10-05 ENCOUNTER — Encounter: Payer: Self-pay | Admitting: Internal Medicine

## 2022-10-05 NOTE — Progress Notes (Signed)
Outside notes received. Information abstracted. Notes sent to scan.  

## 2022-11-09 ENCOUNTER — Telehealth: Payer: Self-pay

## 2022-11-09 NOTE — Telephone Encounter (Signed)
Called patient to schedule Medicare Annual Wellness Visit (AWV). Left message for patient to call back and schedule Medicare Annual Wellness Visit (AWV).  Last date of AWV: 11/08/22  Please schedule an appointment at any time with [facility].    , CMA (AAMA)  [facility]- AWV Program [phone]   

## 2022-12-10 ENCOUNTER — Encounter: Payer: Self-pay | Admitting: Internal Medicine

## 2022-12-10 NOTE — Progress Notes (Unsigned)
Subjective:    Patient ID: Ruth Jones, female    DOB: [birth year], 77 y.o.   MRN: [id number]      HPI Ruth Jones is here for a Physical exam and her chronic medical problems.   Doing well overall - no concerns.    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  Current Outpatient Medications on File Prior to Visit  Medication Sig Dispense Refill   amLODipine (NORVASC) 5 MG tablet TAKE 1 TABLET(5 MG) BY MOUTH IN THE MORNING AND AT BEDTIME 180 tablet 2   Bacillus Coagulans-Inulin (ALIGN PREBIOTIC-PROBIOTIC PO) Take by mouth.     Black Pepper-Turmeric (TURMERIC COMPLEX/BLACK PEPPER PO) Take 1,000 mg by mouth. 1 capsule per day     Calcium-Magnesium-Vitamin D (CALCIUM MAGNESIUM PO) Take by mouth.     doxycycline (ADOXA) 75 MG tablet Take by mouth. rosea     estradiol (ESTRACE) 0.5 MG tablet Take 0.5 mg by mouth daily.     famotidine-calcium carbonate-magnesium hydroxide (PEPCID COMPLETE) 10-800-165 MG chewable tablet Chew 1 tablet by mouth daily as needed. 60 tablet    ferrous sulfate 325 (65 FE) MG tablet Take 325 mg by mouth daily. Take 1/2 daily     Homeopathic Products (LEG CRAMPS PO) Take by mouth. For restful leg     Homeopathic Products (THERAWORX RELIEF) FOAM Apply topically. Patient takes for restless legs     losartan (COZAAR) 25 MG tablet TAKE 1 TABLET(25 MG) BY MOUTH DAILY 90 tablet 2   metroNIDAZOLE (METROCREAM) 0.75 % cream Apply topically daily.     NONFORMULARY OR COMPOUNDED ITEM Relaxing Leg Cream  Rub on legs twice a day and as needed for RLS Disp 4 oz container 1 each 2   NONFORMULARY OR COMPOUNDED ITEM Homeopathic muscle cramp & spasm spray Spray onto legs twice daily and as needed Disp 7.1 fl oz 1 each 2   rOPINIRole (REQUIP) 0.25 MG tablet TAKE 3 TABLETS(0.75 MG) BY MOUTH AT BEDTIME 270 tablet 1   rOPINIRole (REQUIP) 1 MG tablet TAKE 1 TABLET(1 MG)  BY MOUTH THREE TIMES DAILY 270 tablet 2   UNABLE TO FIND 1 capsule. Med Name: women's daily probiotic trunature     vitamin B-12 (CYANOCOBALAMIN) 1000 MCG tablet Take 1,000 mcg by mouth daily.     No current facility-administered medications on file prior to visit.    Review of Systems  Constitutional:  Negative for appetite change and fever.  Eyes:  Negative for visual disturbance.  Respiratory:  Negative for cough, shortness of breath and wheezing.   Cardiovascular:  Negative for chest pain, palpitations and leg swelling.  Gastrointestinal:  Negative for abdominal pain, blood in stool, constipation and diarrhea.       Gerd daily  Genitourinary:  Negative for dysuria.  Musculoskeletal:  Negative for arthralgias and back pain.       Plantar fasciitis - intermittent  Skin:  Negative for rash.  Neurological:  Negative for light-headedness and headaches.  Psychiatric/Behavioral:  Negative for dysphoric mood and sleep disturbance. The patient is not nervous/anxious.        Objective:   Vitals:   12/11/22 1125  BP: 124/72  Pulse: 70  Temp: 98.1 F (36.7 C)  SpO2: 97%   Filed Weights   12/11/22 1125  Weight: 135 lb (61.2 kg)   Body mass index is 24.69 kg/m.  BP Readings from Last 3 Encounters:  12/11/22 124/72  11/14/21 140/76  11/07/21 126/64    Wt Readings from Last 3 Encounters:  12/11/22 135 lb (61.2 kg)  11/14/21 132 lb (59.9 kg)  11/07/21 139 lb (63 kg)       Physical Exam Constitutional: She appears well-developed and well-nourished. No distress.  HENT:  Head: Normocephalic and atraumatic.  Right Ear: External ear normal. Normal ear canal and TM Left Ear: External ear normal.  Normal ear canal and TM Mouth/Throat: Oropharynx is clear and moist.  Eyes: Conjunctivae normal.  Neck: Neck supple. No tracheal deviation present. No thyromegaly present.  No carotid bruit  Cardiovascular: Normal rate, regular rhythm and normal heart sounds.   No murmur heard.   No edema. Pulmonary/Chest: Effort normal and breath sounds normal. No respiratory distress. She has no wheezes. She has no rales.  Breast: deferred   Abdominal: Soft. She exhibits no distension. There is no tenderness.  Lymphadenopathy: She has no cervical adenopathy.  Skin: Skin is warm and dry. She is not diaphoretic.  Psychiatric: She has a normal mood and affect. Her behavior is normal.     Lab Results  Component Value Date   WBC 8.2 05/06/2021   HGB 15.8 (H) 05/06/2021   HCT 47.2 (H) 05/06/2021   PLT 272.0 05/06/2021   GLUCOSE 77 05/06/2021   CHOL 239 (H) 05/06/2021   TRIG 105.0 05/06/2021   HDL 79.90 05/06/2021   LDLDIRECT 124.8 10/09/2011   LDLCALC 138 (H) 05/06/2021   ALT 31 05/06/2021   AST 31 05/06/2021   NA 138 05/06/2021   K 4.4 05/06/2021   CL 100 05/06/2021   CREATININE 1.01 05/06/2021   BUN 22 05/06/2021   CO2 27 05/06/2021   TSH 1.91 05/06/2021   The 10-year ASCVD risk score (Arnett DK, et al., 2019) is: 27.9%   Values used to calculate the score:     Age: 52 years     Sex: Female     Is Non-Hispanic African American: No     Diabetic: No     Tobacco smoker: No     Systolic Blood Pressure: 124 mmHg     Is BP treated: Yes     HDL Cholesterol: 79.9 mg/dL     Total Cholesterol: 239 mg/dL       Assessment & Plan:   Physical exam: Screening blood work  ordered Exercise    walking regularly, yoga Weight   normal Substance abuse  none   Reviewed recommended immunizations.   Health Maintenance  Topic Date Due   COVID-19 Vaccine (5 - 2023-24 season) 04/21/2022   Medicare Annual Wellness (AWV)  11/08/2022   INFLUENZA VACCINE  03/22/2023   DEXA SCAN  05/25/2023   DTaP/Tdap/Td (3 - Td or Tdap) 02/01/2025   Pneumonia Vaccine 27+ Years old  Completed   Hepatitis C Screening  Completed   Zoster Vaccines- Shingrix  Completed   HPV VACCINES  Aged Out   COLONOSCOPY (Pts 45-65yrs Insurance coverage will need to be confirmed)  Discontinued           See Problem List for Assessment and Plan of chronic medical problems.

## 2022-12-10 NOTE — Patient Instructions (Signed)
    Blood work was ordered.   The lab is on the first floor.    Medications changes include :   none     Return in about 1 year (around 10/23/2023) for Physical Exam.   Health Maintenance, Female Adopting a healthy lifestyle and getting preventive care are important in promoting health and wellness. Ask your health care provider about: The right schedule for you to have regular tests and exams. Things you can do on your own to prevent diseases and keep yourself healthy. What should I know about diet, weight, and exercise? Eat a healthy diet  Eat a diet that includes plenty of vegetables, fruits, low-fat dairy products, and lean protein. Do not eat a lot of foods that are high in solid fats, added sugars, or sodium. Maintain a healthy weight Body mass index (BMI) is used to identify weight problems. It estimates body fat based on height and weight. Your health care provider can help determine your BMI and help you achieve or maintain a healthy weight. Get regular exercise Get regular exercise. This is one of the most important things you can do for your health. Most adults should: Exercise for at least 150 minutes each week. The exercise should increase your heart rate and make you sweat (moderate-intensity exercise). Do strengthening exercises at least twice a week. This is in addition to the moderate-intensity exercise. Spend less time sitting. Even light physical activity can be beneficial. Watch cholesterol and blood lipids Have your blood tested for lipids and cholesterol at 79 years of age, then have this test every 5 years. Have your cholesterol levels checked more often if: Your lipid or cholesterol levels are high. You are older than 79 years of age. You are at high risk for heart disease. What should I know about cancer screening? Depending on your health history and family history, you may need to have cancer screening at various ages. This may include screening  for: Breast cancer. Cervical cancer. Colorectal cancer. Skin cancer. Lung cancer. What should I know about heart disease, diabetes, and high blood pressure? Blood pressure and heart disease High blood pressure causes heart disease and increases the risk of stroke. This is more likely to develop in people who have high blood pressure readings or are overweight. Have your blood pressure checked: Every 3-5 years if you are 18-39 years of age. Every year if you are 40 years old or older. Diabetes Have regular diabetes screenings. This checks your fasting blood sugar level. Have the screening done: Once every three years after age 40 if you are at a normal weight and have a low risk for diabetes. More often and at a younger age if you are overweight or have a high risk for diabetes. What should I know about preventing infection? Hepatitis B If you have a higher risk for hepatitis B, you should be screened for this virus. Talk with your health care provider to find out if you are at risk for hepatitis B infection. Hepatitis C Testing is recommended for: Everyone born from 1945 through 1965. Anyone with known risk factors for hepatitis C. Sexually transmitted infections (STIs) Get screened for STIs, including gonorrhea and chlamydia, if: You are sexually active and are younger than 79 years of age. You are older than 79 years of age and your health care provider tells you that you are at risk for this type of infection. Your sexual activity has changed since you were last screened, and you are at   increased risk for chlamydia or gonorrhea. Ask your health care provider if you are at risk. Ask your health care provider about whether you are at high risk for HIV. Your health care provider may recommend a prescription medicine to help prevent HIV infection. If you choose to take medicine to prevent HIV, you should first get tested for HIV. You should then be tested every 3 months for as long as you  are taking the medicine. Pregnancy If you are about to stop having your period (premenopausal) and you may become pregnant, seek counseling before you get pregnant. Take 400 to 800 micrograms (mcg) of folic acid every day if you become pregnant. Ask for birth control (contraception) if you want to prevent pregnancy. Osteoporosis and menopause Osteoporosis is a disease in which the bones lose minerals and strength with aging. This can result in bone fractures. If you are 65 years old or older, or if you are at risk for osteoporosis and fractures, ask your health care provider if you should: Be screened for bone loss. Take a calcium or vitamin D supplement to lower your risk of fractures. Be given hormone replacement therapy (HRT) to treat symptoms of menopause. Follow these instructions at home: Alcohol use Do not drink alcohol if: Your health care provider tells you not to drink. You are pregnant, may be pregnant, or are planning to become pregnant. If you drink alcohol: Limit how much you have to: 0-1 drink a day. Know how much alcohol is in your drink. In the [place] one drink equals one 12 oz bottle of beer (355 mL), one 5 oz glass of wine (148 mL), or one 1 oz glass of hard liquor (44 mL). Lifestyle Do not use any products that contain nicotine or tobacco. These products include cigarettes, chewing tobacco, and vaping devices, such as e-cigarettes. If you need help quitting, ask your health care provider. Do not use street drugs. Do not share needles. Ask your health care provider for help if you need support or information about quitting drugs. General instructions Schedule regular health, dental, and eye exams. Stay current with your vaccines. Tell your health care provider if: You often feel depressed. You have ever been abused or do not feel safe at home. Summary Adopting a healthy lifestyle and getting preventive care are important in promoting health and wellness. Follow your  health care provider's instructions about healthy diet, exercising, and getting tested or screened for diseases. Follow your health care provider's instructions on monitoring your cholesterol and blood pressure. This information is not intended to replace advice given to you by your health care provider. Make sure you discuss any questions you have with your health care provider. Document Revised: 12/27/2020 Document Reviewed: 12/27/2020 Elsevier Patient Education  2023 [employer]  

## 2022-12-11 ENCOUNTER — Ambulatory Visit (INDEPENDENT_AMBULATORY_CARE_PROVIDER_SITE_OTHER): Payer: Medicare Other | Admitting: Internal Medicine

## 2022-12-11 VITALS — BP 124/72 | HR 70 | Temp 98.1°F | Ht 62.0 in | Wt 135.0 lb

## 2022-12-11 DIAGNOSIS — E782 Mixed hyperlipidemia: Secondary | ICD-10-CM | POA: Diagnosis not present

## 2022-12-11 DIAGNOSIS — Z0000 Encounter for general adult medical examination without abnormal findings: Secondary | ICD-10-CM | POA: Diagnosis not present

## 2022-12-11 DIAGNOSIS — M8589 Other specified disorders of bone density and structure, multiple sites: Secondary | ICD-10-CM | POA: Diagnosis not present

## 2022-12-11 DIAGNOSIS — G2581 Restless legs syndrome: Secondary | ICD-10-CM | POA: Diagnosis not present

## 2022-12-11 DIAGNOSIS — I10 Essential (primary) hypertension: Secondary | ICD-10-CM

## 2022-12-11 DIAGNOSIS — G3184 Mild cognitive impairment, so stated: Secondary | ICD-10-CM

## 2022-12-11 DIAGNOSIS — K219 Gastro-esophageal reflux disease without esophagitis: Secondary | ICD-10-CM | POA: Diagnosis not present

## 2022-12-11 LAB — CBC WITH DIFFERENTIAL/PLATELET
Basophils Absolute: 0 10*3/uL (ref 0.0–0.1)
Basophils Relative: 0.6 % (ref 0.0–3.0)
Eosinophils Absolute: 0.6 10*3/uL (ref 0.0–0.7)
Eosinophils Relative: 7.6 % — ABNORMAL HIGH (ref 0.0–5.0)
HCT: 45.2 % (ref 36.0–46.0)
Hemoglobin: 15.2 g/dL — ABNORMAL HIGH (ref 12.0–15.0)
Lymphocytes Relative: 24.4 % (ref 12.0–46.0)
Lymphs Abs: 2 10*3/uL (ref 0.7–4.0)
MCHC: 33.7 g/dL (ref 30.0–36.0)
MCV: 95.7 fl (ref 78.0–100.0)
Monocytes Absolute: 0.7 10*3/uL (ref 0.1–1.0)
Monocytes Relative: 8.9 % (ref 3.0–12.0)
Neutro Abs: 4.8 10*3/uL (ref 1.4–7.7)
Neutrophils Relative %: 58.5 % (ref 43.0–77.0)
Platelets: 299 10*3/uL (ref 150.0–400.0)
RBC: 4.72 Mil/uL (ref 3.87–5.11)
RDW: 13 % (ref 11.5–15.5)
WBC: 8.2 10*3/uL (ref 4.0–10.5)

## 2022-12-11 LAB — COMPREHENSIVE METABOLIC PANEL
ALT: 30 U/L (ref 0–35)
AST: 29 U/L (ref 0–37)
Albumin: 4.2 g/dL (ref 3.5–5.2)
Alkaline Phosphatase: 65 U/L (ref 39–117)
BUN: 22 mg/dL (ref 6–23)
CO2: 30 mEq/L (ref 19–32)
Calcium: 9.6 mg/dL (ref 8.4–10.5)
Chloride: 100 mEq/L (ref 96–112)
Creatinine, Ser: 1.02 mg/dL (ref 0.40–1.20)
GFR: 52.63 mL/min — ABNORMAL LOW (ref >60.00)
Glucose, Bld: 86 mg/dL (ref 70–99)
Potassium: 4.5 mEq/L (ref 3.5–5.1)
Sodium: 137 mEq/L (ref 135–145)
Total Bilirubin: 0.9 mg/dL (ref 0.2–1.2)
Total Protein: 6.9 g/dL (ref 6.0–8.3)

## 2022-12-11 LAB — TSH: TSH: 1.51 u[IU]/mL (ref 0.35–5.50)

## 2022-12-11 LAB — LIPID PANEL
Cholesterol: 212 mg/dL — ABNORMAL HIGH (ref 0–200)
HDL: 58 mg/dL (ref >39.00)
LDL Cholesterol: 117 mg/dL — ABNORMAL HIGH (ref 0–99)
NonHDL: 154.08
Total CHOL/HDL Ratio: 4
Triglycerides: 187 mg/dL — ABNORMAL HIGH (ref 0.0–149.0)
VLDL: 37.4 mg/dL (ref 0.0–40.0)

## 2022-12-11 LAB — VITAMIN B12: Vitamin B-12: 711 pg/mL (ref 211–911)

## 2022-12-11 MED ORDER — AMLODIPINE BESYLATE 5 MG PO TABS: ORAL_TABLET | ORAL | 3 refills | Status: DC

## 2022-12-11 MED ORDER — ROPINIROLE HCL 1 MG PO TABS: 1.0000 mg | ORAL_TABLET | Freq: Every day | ORAL | 3 refills | Status: DC

## 2022-12-11 MED ORDER — ROPINIROLE HCL 0.5 MG PO TABS: 0.5000 mg | ORAL_TABLET | Freq: Two times a day (BID) | ORAL | 5 refills | Status: DC | PRN

## 2022-12-11 MED ORDER — AMLODIPINE BESYLATE 5 MG PO TABS: 5.0000 mg | ORAL_TABLET | Freq: Every day | ORAL | 3 refills | Status: DC

## 2022-12-11 MED ORDER — ROSUVASTATIN CALCIUM 5 MG PO TABS: 5.0000 mg | ORAL_TABLET | Freq: Every day | ORAL | 3 refills | Status: DC

## 2022-12-11 NOTE — Assessment & Plan Note (Addendum)
Chronic controlled Currently taking Requip to 1 mg at night Taking 0.5 mg requip during the day as needed

## 2022-12-11 NOTE — Assessment & Plan Note (Signed)
Chronic Blood pressure well controlled CMP Continue amlodipine 5 mg daily, losartan 25 mg daily

## 2022-12-11 NOTE — Assessment & Plan Note (Addendum)
Did have evaluation with Dr. Milbert Coulter 03/2022 Continue regular exercise, healthy diet, good sleep, brain exercises

## 2022-12-11 NOTE — Assessment & Plan Note (Signed)
Chronic Controlled Continue Pepcid complete as needed

## 2022-12-11 NOTE — Assessment & Plan Note (Signed)
Chronic dexa up to date Continue calcium and vitamin D Continue regular exercise 

## 2022-12-11 NOTE — Assessment & Plan Note (Addendum)
Chronic Regular exercise and healthy diet encouraged Check lipid panel today Start crestor 5 mg daily -recheck lipids in 2 months   Discussed elevated ASCVD risk and brain MRI from 2012-recommended statin

## 2023-01-25 ENCOUNTER — Other Ambulatory Visit: Payer: Self-pay | Admitting: Internal Medicine

## 2023-02-09 ENCOUNTER — Other Ambulatory Visit (INDEPENDENT_AMBULATORY_CARE_PROVIDER_SITE_OTHER): Payer: Medicare Other

## 2023-02-09 DIAGNOSIS — E782 Mixed hyperlipidemia: Secondary | ICD-10-CM | POA: Diagnosis not present

## 2023-02-09 LAB — HEPATIC FUNCTION PANEL
ALT: 28 U/L (ref 0–35)
AST: 30 U/L (ref 0–37)
Albumin: 4.2 g/dL (ref 3.5–5.2)
Alkaline Phosphatase: 62 U/L (ref 39–117)
Bilirubin, Direct: 0.2 mg/dL (ref 0.0–0.3)
Total Bilirubin: 1 mg/dL (ref 0.2–1.2)
Total Protein: 7.1 g/dL (ref 6.0–8.3)

## 2023-02-09 LAB — LIPID PANEL
Cholesterol: 158 mg/dL (ref 0–200)
HDL: 65.5 mg/dL (ref >39.00)
LDL Cholesterol: 68 mg/dL (ref 0–99)
NonHDL: 92.77
Total CHOL/HDL Ratio: 2
Triglycerides: 126 mg/dL (ref 0.0–149.0)
VLDL: 25.2 mg/dL (ref 0.0–40.0)

## 2023-04-03 ENCOUNTER — Ambulatory Visit (INDEPENDENT_AMBULATORY_CARE_PROVIDER_SITE_OTHER): Payer: Medicare Other

## 2023-04-03 ENCOUNTER — Encounter: Payer: Self-pay | Admitting: Internal Medicine

## 2023-04-03 VITALS — Ht 62.0 in | Wt 135.0 lb

## 2023-04-03 DIAGNOSIS — N183 Chronic kidney disease, stage 3 unspecified: Secondary | ICD-10-CM | POA: Insufficient documentation

## 2023-04-03 DIAGNOSIS — Z0000 Encounter for general adult medical examination without abnormal findings: Secondary | ICD-10-CM

## 2023-04-03 NOTE — Patient Instructions (Addendum)
      Blood work was ordered.   The lab is on the first floor.    Medications changes include :   none    An ultrasound of your kidneys was ordered and someone will call you to schedule an appointment.     Return in about 6 months (around 10/05/2023) for Physical Exam.

## 2023-04-03 NOTE — Progress Notes (Signed)
Subjective:   Ruth Jones is a 77 y.o. female who presents for Medicare Annual (Subsequent) preventive examination.  Visit Complete: Virtual  I connected with  Ruth Jones on 04/03/23 by a audio enabled telemedicine application and verified that I am speaking with the correct person using two identifiers.  Patient Location: Home  Provider Location: Office/Clinic  I discussed the limitations of evaluation and management by telemedicine. The patient expressed understanding and agreed to proceed.  Vital Signs: Unable to obtain new vitals due to this being a telehealth visit. Patient provided weight for this televisit.  Review of Systems     Cardiac Risk Factors include: advanced age (>48men, >43 women);family history of premature cardiovascular disease;dyslipidemia;hypertension     Objective:    Today's Vitals   04/03/23 1404  Weight: 135 lb (61.2 kg)  Height: 5\' 2"  (1.575 m)  PainSc: 0-No pain   Body mass index is 24.69 kg/m.     04/03/2023    2:06 PM 11/07/2021    2:43 PM 06/08/2020    9:15 AM 04/08/2018    1:35 PM  Advanced Directives  Does Patient Have a Medical Advance Directive? Yes Yes Yes No  Type of Estate agent of [place];Living will Living will;Healthcare Power of Attorney    Does patient want to make changes to medical advance directive?  No - Patient declined    Copy of Healthcare Power of Attorney in Chart? No - copy requested No - copy requested    Would patient like information on creating a medical advance directive?    Yes (ED - Information included in AVS)    Current Medications (verified) Outpatient Encounter Medications as of 04/03/2023  Medication Sig   amLODipine (NORVASC) 5 MG tablet Take 1 tablet (5 mg total) by mouth daily. TAKE 1 TABLET(5 MG) BY MOUTH IN THE MORNING AND AT BEDTIME   Bacillus Coagulans-Inulin (ALIGN PREBIOTIC-PROBIOTIC PO) Take by mouth.   Black Pepper-Turmeric (TURMERIC COMPLEX/BLACK PEPPER PO)  Take 1,000 mg by mouth. 1 capsule per day   Calcium-Magnesium-Vitamin D (CALCIUM MAGNESIUM PO) Take by mouth.   doxycycline (ADOXA) 75 MG tablet Take by mouth. rosea   estradiol (ESTRACE) 0.5 MG tablet Take 0.5 mg by mouth daily.   famotidine-calcium carbonate-magnesium hydroxide (PEPCID COMPLETE) 10-800-165 MG chewable tablet Chew 1 tablet by mouth daily as needed.   ferrous sulfate 325 (65 FE) MG tablet Take 325 mg by mouth daily. Take 1/2 daily   Homeopathic Products (LEG CRAMPS PO) Take by mouth. For restful leg   Homeopathic Products (THERAWORX RELIEF) FOAM Apply topically. Patient takes for restless legs   losartan (COZAAR) 25 MG tablet TAKE 1 TABLET(25 MG) BY MOUTH DAILY   metroNIDAZOLE (METROCREAM) 0.75 % cream Apply topically daily.   NONFORMULARY OR COMPOUNDED ITEM Relaxing Leg Cream  Rub on legs twice a day and as needed for RLS Disp 4 oz container   NONFORMULARY OR COMPOUNDED ITEM Homeopathic muscle cramp & spasm spray Spray onto legs twice daily and as needed Disp 7.1 fl oz   rOPINIRole (REQUIP) 0.5 MG tablet Take 1 tablet (0.5 mg total) by mouth 2 (two) times daily as needed.   rOPINIRole (REQUIP) 1 MG tablet Take 1 tablet (1 mg total) by mouth at bedtime.   rosuvastatin (CRESTOR) 5 MG tablet Take 1 tablet (5 mg total) by mouth daily.   UNABLE TO FIND 1 capsule. Med Name: women's daily probiotic trunature   vitamin B-12 (CYANOCOBALAMIN) 1000 MCG tablet Take 1,000 mcg by  mouth daily.   No facility-administered encounter medications on file as of 04/03/2023.    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   Mild cognitive impairment with memory loss 03/28/2022   Muscle cramping 05/04/2020   Osteopenia    Plantar fasciitis, bilateral 10/22/2018   Raynaud's phenomenon 04/04/2016   Nose, hands, feet   RLS (restless legs syndrome) 05/04/2020   Sensorineural hearing loss (SNHL) of both ears 05/09/2019   Snoring 01/17/2012   Past Surgical History:  Procedure Laterality Date   COLONOSCOPY W/ POLYPECTOMY     MOHS SURGERY  2016   melanoma RLE   UPPER GASTROINTESTINAL ENDOSCOPY  2011   Dr Marina Goodell   VESICOVAGINAL FISTULA CLOSURE W/ TAH  1990   Family History  Problem Relation Age of Onset   Heart disease Father    Osteoporosis Mother    Lymphoma Mother        non-hodgkins   Stroke Neg Hx    Diabetes Neg Hx    Colon cancer Neg Hx    Social History   Socioeconomic History   Marital status: Married    Spouse name: Not on file   Number of children: 2   Years of education: 16   Highest education level: Bachelor's degree (e.g., BA, AB, BS)  Occupational History   Occupation: Retired    Comment: Runner, broadcasting/film/video  Tobacco Use   Smoking status: Never   Smokeless tobacco: Never  Vaping Use   Vaping status: Never Used  Substance and Sexual Activity   Alcohol use: Yes    Alcohol/week: 7.0 standard drinks of alcohol    Types: 7 Glasses of wine per week    Comment: 1 glass of wine nightly   Drug use: No   Sexual activity: Not Currently  Other Topics Concern   Not on file  Social History Narrative   Right handed   Two story home   Drinks caffeine   Social Determinants of Health   Financial Resource Strain: Low Risk  (04/03/2023)   Overall Financial Resource Strain (CARDIA)    Difficulty of Paying Living Expenses: Not hard at all  Food Insecurity: No Food Insecurity (04/03/2023)    Hunger Vital Sign    Worried About Running Out of Food in the Last Year: Never true    Ran Out of Food in the Last Year: Never true  Transportation Needs: No Transportation Needs (04/03/2023)   PRAPARE - Administrator, Civil Service (Medical): No    Lack of Transportation (Non-Medical): No  Physical Activity: Sufficiently Active (04/03/2023)   Exercise Vital Sign    Days of Exercise per Week: 3 days    Minutes of Exercise per Session: 60 min  Stress: No Stress Concern Present (04/03/2023)   [employer] of Occupational Health - Occupational Stress Questionnaire    Feeling of Stress : Not at all  Social Connections: Socially Integrated (04/03/2023)   Social Connection and  Isolation Panel [NHANES]    Frequency of Communication with Friends and Family: More than three times a week    Frequency of Social Gatherings with Friends and Family: More than three times a week    Attends Religious Services: More than 4 times per year    Active Member of [employer] or Organizations: Yes    Attends Engineer, structural: More than 4 times per year    Marital Status: Married    Tobacco Counseling Counseling given: Not Answered   Clinical Intake:  Pre-visit preparation completed: Yes  Pain : No/denies pain Pain Score: 0-No pain     BMI - recorded: 24.69 Nutritional Status: BMI of 19-24  Normal Nutritional Risks: None Diabetes: No  How often do you need to have someone help you when you read instructions, pamphlets, or other written materials from your doctor or pharmacy?: 1 - Never What is the last grade level you completed in school?: COLLEGE GRADUATE  Interpreter Needed?: No  Information entered by ::  N. , LPN.   Activities of Daily Living    04/03/2023    2:08 PM  In your present state of health, do you have any difficulty performing the following activities:  Hearing? 0  Vision? 0  Difficulty concentrating or making decisions? 1  Comment  MEMORY  Walking or climbing stairs? 0  Dressing or bathing? 0  Doing errands, shopping? 0  Preparing Food and eating ? N  Using the Toilet? N  In the past six months, have you accidently leaked urine? N  Do you have problems with loss of bowel control? N  Managing your Medications? N  Managing your Finances? N  Housekeeping or managing your Housekeeping? N    Patient Care Team: Pincus Sanes, MD as PCP - General (Internal Medicine) Kathyrn Sheriff, [facility] (Inactive) as Pharmacist (Pharmacist) Nelson Chimes, MD as Consulting Physician (Ophthalmology)  Indicate any recent Medical Services you may have received from other than [facility] providers in the past year (date may be approximate).     Assessment:   This is a routine wellness examination for [facility].  Hearing/Vision screen Hearing Screening - Comments:: Patient denied any hearing difficulty.   No hearing aids.  Vision Screening - Comments:: Patient does wear corrective lenses/contacts.  Annual eye exam done by: [facility]   Dietary issues and exercise activities discussed:     Goals Addressed             This Visit's Progress    Patient Stated       Maintain my health.      Depression Screen    04/03/2023    2:07 PM 12/11/2022   11:33 AM 11/07/2021    2:47 PM 05/04/2020    2:52 PM 04/14/2019   10:37 AM 04/08/2018    1:41 PM 04/06/2017   12:14 PM  PHQ 2/9 Scores  PHQ - 2 Score 0 0 0 0 1 0 0  PHQ- 9 Score 0 1   2      Fall Risk    04/03/2023    2:07 PM 12/11/2022   11:32 AM 11/07/2021    2:46 PM 06/08/2020    9:15 AM 05/04/2020    2:52 PM  Fall Risk   Falls in the past year? 0 0 0 0 0  Number falls in past yr: 0 0 0 0 0  Injury with Fall? 0 0 0 0 0  Risk for fall due to : No Fall Risks  No Fall Risks No Fall Risks    Follow up Falls prevention discussed Falls evaluation completed Falls evaluation completed      MEDICARE RISK AT HOME:  Medicare Risk at Home - 04/03/23 1406     Any stairs in  or around the home? Yes    If so, are there any without handrails? No    Home free of loose throw rugs in walkways, pet beds, electrical cords, etc? Yes    Adequate lighting in your home to reduce risk of falls? Yes    Life alert? No    Use of a cane, walker or w/c? No    Grab bars in the bathroom? No    Shower chair or bench in shower? Yes    Elevated toilet seat or a handicapped toilet? Yes             TIMED UP AND GO:  Was the test performed?  No    Cognitive Function:        04/03/2023    2:08 PM  6CIT Screen  What Year? 0 points  What month? 0 points  What time? 0 points  Count back from 20 0 points  Months in reverse 0 points  Repeat phrase 0 points  Total Score 0 points    Immunizations Immunization History  Administered Date(s) Administered   Fluad Quad(high Dose 65+) 04/14/2019   Influenza Whole 06/22/2011   Influenza, High Dose Seasonal PF 06/26/2013, 06/24/2015, 06/06/2016, 05/09/2018   Influenza,inj,Quad PF,6+ Mos 04/30/2014   Influenza-Unspecified 06/10/2017, 04/13/2018, 05/24/2020, 05/16/2021   Moderna Covid-19 Vaccine Bivalent Booster 78yrs & up 05/16/2021   Moderna SARS-COV2 Booster Vaccination 01/11/2021   Moderna Sars-Covid-2 Vaccination 09/03/2019, 10/01/2019, 06/28/2020   Pneumococcal Conjugate-13 07/28/2015   Pneumococcal Polysaccharide-23 08/02/2009   Td 08/22/2003   Tdap 02/02/2015   Zoster Recombinant(Shingrix) 01/14/2017, 06/27/2017   Zoster, Live 11/18/2007    TDAP status: Up to date  Flu Vaccine status: Up to date  Pneumococcal vaccine status: Up to date  Covid-19 vaccine status: Completed vaccines  Qualifies for Shingles Vaccine? Yes   Zostavax completed Yes   Shingrix Completed?: Yes  Screening Tests Health Maintenance  Topic Date Due   COVID-19 Vaccine (5 - 2023-24 season) 04/21/2022   INFLUENZA VACCINE  03/22/2023   DEXA SCAN  05/25/2023   Medicare Annual Wellness (AWV)  04/02/2024   DTaP/Tdap/Td (3 - Td or Tdap)  02/01/2025   Pneumonia Vaccine 65+ Years old  Completed   Hepatitis C Screening  Completed   Zoster Vaccines- Shingrix  Completed   HPV VACCINES  Aged Out   Colonoscopy  Discontinued    Health Maintenance  Health Maintenance Due  Topic Date Due   COVID-19 Vaccine (5 - 2023-24 season) 04/21/2022   INFLUENZA VACCINE  03/22/2023    Colorectal cancer screening: No longer required.   Mammogram status: Completed 09/21/2022. Repeat every year  Bone Density status: Completed 05/24/2020. Results reflect: Bone density results: OSTEOPENIA. Repeat every 3 years.  Lung Cancer Screening: (Low Dose CT Chest recommended if Age 21-80 years, 20 pack-year currently smoking OR have quit w/in 15years.) does not qualify.   Lung Cancer Screening Referral: no  Additional Screening:  Hepatitis C Screening: does qualify; Completed 07/28/2015  Vision Screening: Recommended annual ophthalmology exams for early detection of glaucoma and other disorders of the eye. Is the patient up to date with their annual eye exam?  Yes  Who is the provider or what is the name of the office in which  the patient attends annual eye exams? [facility] If pt is not established with a provider, would they like to be referred to a provider to establish care? No .   Dental Screening: Recommended annual dental exams for proper oral hygiene  Diabetic Foot Exam: Diabetic Foot Exam: Completed N/A  Community Resource Referral / Chronic Care Management: CRR required this visit?  No   CCM required this visit?  No     Plan:     I have personally reviewed and noted the following in the patient's chart:   Medical and social history Use of alcohol, tobacco or illicit drugs  Current medications and supplements including opioid prescriptions. Patient is not currently taking opioid prescriptions. Functional ability and status Nutritional status Physical activity Advanced directives List of other  physicians Hospitalizations, surgeries, and ER visits in previous 12 months Vitals Screenings to include cognitive, depression, and falls Referrals and appointments  In addition, I have reviewed and discussed with patient certain preventive protocols, quality metrics, and best practice recommendations. A written personalized care plan for preventive services as well as general preventive health recommendations were provided to patient.     Mickeal Needy, LPN   11/27/8117   After Visit Summary: (Mail) Due to this being a telephonic visit, the after visit summary with patients personalized plan was offered to patient via mail   Nurse Notes: Patient has current diagnosis of cognitive impairment. Patient is followed by primary care/neurology for ongoing assessment. Patient was able to complete 6CIT.

## 2023-04-03 NOTE — Progress Notes (Unsigned)
Subjective:    Patient ID: Ruth Jones, female    DOB: 1945-09-02, 77 y.o.   MRN: [id number]     HPI Miray is here for follow up of her chronic medical problems.  Overall doing well.  RLS is a little bit worse, but adjusted her medication and seems to be helping.  Woke up one night with right thrid finger flexed - had to move finger back - now has pain in the finger.    Medications and allergies reviewed with patient and updated if appropriate.  Current Outpatient Medications on File Prior to Visit  Medication Sig Dispense Refill   Bacillus Coagulans-Inulin (ALIGN PREBIOTIC-PROBIOTIC PO) Take by mouth.     Black Pepper-Turmeric (TURMERIC COMPLEX/BLACK PEPPER PO) Take 1,000 mg by mouth. 1 capsule per day     Calcium-Magnesium-Vitamin D (CALCIUM MAGNESIUM PO) Take by mouth.     doxycycline (ADOXA) 75 MG tablet Take by mouth. rosea     estradiol (ESTRACE) 0.5 MG tablet Take 0.5 mg by mouth daily.     famotidine-calcium carbonate-magnesium hydroxide (PEPCID COMPLETE) 10-800-165 MG chewable tablet Chew 1 tablet by mouth daily as needed. 60 tablet    ferrous sulfate 325 (65 FE) MG tablet Take 325 mg by mouth daily. Take 1/2 daily     Homeopathic Products (LEG CRAMPS PO) Take by mouth. For restful leg     Homeopathic Products (THERAWORX RELIEF) FOAM Apply topically. Patient takes for restless legs     losartan (COZAAR) 25 MG tablet TAKE 1 TABLET(25 MG) BY MOUTH DAILY 90 tablet 2   metroNIDAZOLE (METROCREAM) 0.75 % cream Apply topically daily.     NONFORMULARY OR COMPOUNDED ITEM Relaxing Leg Cream  Rub on legs twice a day and as needed for RLS Disp 4 oz container 1 each 2   NONFORMULARY OR COMPOUNDED ITEM Homeopathic muscle cramp & spasm spray Spray onto legs twice daily and as needed Disp 7.1 fl oz 1 each 2   rOPINIRole (REQUIP) 0.5 MG tablet Take 1 tablet (0.5 mg total) by mouth 2 (two) times daily as needed. 60 tablet 5   rOPINIRole (REQUIP) 1 MG tablet Take 1 tablet (1 mg  total) by mouth at bedtime. 90 tablet 3   rosuvastatin (CRESTOR) 5 MG tablet Take 1 tablet (5 mg total) by mouth daily. 90 tablet 3   UNABLE TO FIND 1 capsule. Med Name: women's daily probiotic trunature     vitamin B-12 (CYANOCOBALAMIN) 1000 MCG tablet Take 1,000 mcg by mouth daily.     No current facility-administered medications on file prior to visit.     Review of Systems  Constitutional:  Negative for fever.  Respiratory:  Negative for cough, shortness of breath and wheezing.   Cardiovascular:  Negative for chest pain, palpitations and leg swelling.  Musculoskeletal:  Positive for arthralgias (right middle finger).  Neurological:  Negative for light-headedness and headaches.       Objective:   Vitals:   04/04/23 1508  BP: 130/70  Pulse: 73  Temp: 98.2 F (36.8 C)  SpO2: 95%   BP Readings from Last 3 Encounters:  04/04/23 130/70  12/11/22 124/72  11/14/21 140/76   Wt Readings from Last 3 Encounters:  04/04/23 137 lb 6.4 oz (62.3 kg)  04/03/23 135 lb (61.2 kg)  12/11/22 135 lb (61.2 kg)   Body mass index is 25.13 kg/m.    Physical Exam Constitutional:      General: She is not in acute distress.  Appearance: Normal appearance.  HENT:     Head: Normocephalic and atraumatic.  Eyes:     Conjunctiva/sclera: Conjunctivae normal.  Cardiovascular:     Rate and Rhythm: Normal rate and regular rhythm.     Heart sounds: Normal heart sounds.  Pulmonary:     Effort: Pulmonary effort is normal. No respiratory distress.     Breath sounds: Normal breath sounds. No wheezing.  Musculoskeletal:     Cervical back: Neck supple.     Right lower leg: No edema.     Left lower leg: No edema.  Lymphadenopathy:     Cervical: No cervical adenopathy.  Skin:    General: Skin is warm and dry.     Findings: No rash.  Neurological:     Mental Status: She is alert. Mental status is at baseline.  Psychiatric:        Mood and Affect: Mood normal.        Behavior: Behavior  normal.        Lab Results  Component Value Date   WBC 8.2 12/11/2022   HGB 15.2 (H) 12/11/2022   HCT 45.2 12/11/2022   PLT 299.0 12/11/2022   GLUCOSE 86 12/11/2022   CHOL 158 02/09/2023   TRIG 126.0 02/09/2023   HDL 65.50 02/09/2023   LDLDIRECT 124.8 10/09/2011   LDLCALC 68 02/09/2023   ALT 28 02/09/2023   AST 30 02/09/2023   NA 137 12/11/2022   K 4.5 12/11/2022   CL 100 12/11/2022   CREATININE 1.02 12/11/2022   BUN 22 12/11/2022   CO2 30 12/11/2022   TSH 1.51 12/11/2022     Assessment & Plan:    See Problem List for Assessment and Plan of chronic medical problems.

## 2023-04-03 NOTE — Patient Instructions (Addendum)
[name] , Thank you for taking time to come for your Medicare Wellness Visit. I appreciate your ongoing commitment to your health goals. Please review the following plan we discussed and let me know if I can assist you in the future.   Referrals/Orders/Follow-Ups/Clinician Recommendations: no  This is a list of the screening recommended for you and due dates:  Health Maintenance  Topic Date Due   COVID-19 Vaccine (5 - 2023-24 season) 04/21/2022   Flu Shot  03/22/2023   DEXA scan (bone density measurement)  05/25/2023   Medicare Annual Wellness Visit  04/02/2024   DTaP/Tdap/Td vaccine (3 - Td or Tdap) 02/01/2025   Pneumonia Vaccine  Completed   Hepatitis C Screening  Completed   Zoster (Shingles) Vaccine  Completed   HPV Vaccine  Aged Out   Colon Cancer Screening  Discontinued    Advanced directives: (Copy Requested) Please bring a copy of your health care power of attorney and living will to the office to be added to your chart at your convenience.  Next Medicare Annual Wellness Visit scheduled for next year: Yes  Preventive Care 79 Years and Older, Female Preventive care refers to lifestyle choices and visits with your health care provider that can promote health and wellness. What does preventive care include? A yearly physical exam. This is also called an annual well check. Dental exams once or twice a year. Routine eye exams. Ask your health care provider how often you should have your eyes checked. Personal lifestyle choices, including: Daily care of your teeth and gums. Regular physical activity. Eating a healthy diet. Avoiding tobacco and drug use. Limiting alcohol use. Practicing safe sex. Taking low-dose aspirin every day. Taking vitamin and mineral supplements as recommended by your health care provider. What happens during an annual well check? The services and screenings done by your health care provider during your annual well check will depend on your age,  overall health, lifestyle risk factors, and family history of disease. Counseling  Your health care provider may ask you questions about your: Alcohol use. Tobacco use. Drug use. Emotional well-being. Home and relationship well-being. Sexual activity. Eating habits. History of falls. Memory and ability to understand (cognition). Work and work Astronomer. Reproductive health. Screening  You may have the following tests or measurements: Height, weight, and BMI. Blood pressure. Lipid and cholesterol levels. These may be checked every 5 years, or more frequently if you are over 79 years old. Skin check. Lung cancer screening. You may have this screening every year starting at age 79 if you have a 30-pack-year history of smoking and currently smoke or have quit within the past 15 years. Fecal occult blood test (FOBT) of the stool. You may have this test every year starting at age 79. Flexible sigmoidoscopy or colonoscopy. You may have a sigmoidoscopy every 5 years or a colonoscopy every 10 years starting at age 79. Hepatitis C blood test. Hepatitis B blood test. Sexually transmitted disease (STD) testing. Diabetes screening. This is done by checking your blood sugar (glucose) after you have not eaten for a while (fasting). You may have this done every 1-3 years. Bone density scan. This is done to screen for osteoporosis. You may have this done starting at age 79. Mammogram. This may be done every 1-2 years. Talk to your health care provider about how often you should have regular mammograms. Talk with your health care provider about your test results, treatment options, and if necessary, the need for more tests. Vaccines  Your health care provider may recommend certain vaccines, such as: Influenza vaccine. This is recommended every year. Tetanus, diphtheria, and acellular pertussis (Tdap, Td) vaccine. You may need a Td booster every 10 years. Zoster vaccine. You may need this after age  79. Pneumococcal 13-valent conjugate (PCV13) vaccine. One dose is recommended after age 79. Pneumococcal polysaccharide (PPSV23) vaccine. One dose is recommended after age 79. Talk to your health care provider about which screenings and vaccines you need and how often you need them. This information is not intended to replace advice given to you by your health care provider. Make sure you discuss any questions you have with your health care provider. Document Released: 09/03/2015 Document Revised: 04/26/2016 Document Reviewed: 06/08/2015 Elsevier Interactive Patient Education  2017 [employer].  Fall Prevention in the Home Falls can cause injuries. They can happen to people of all ages. There are many things you can do to make your home safe and to help prevent falls. What can I do on the outside of my home? Regularly fix the edges of walkways and driveways and fix any cracks. Remove anything that might make you trip as you walk through a door, such as a raised step or threshold. Trim any bushes or trees on the path to your home. Use bright outdoor lighting. Clear any walking paths of anything that might make someone trip, such as rocks or tools. Regularly check to see if handrails are loose or broken. Make sure that both sides of any steps have handrails. Any raised decks and porches should have guardrails on the edges. Have any leaves, snow, or ice cleared regularly. Use sand or salt on walking paths during winter. Clean up any spills in your garage right away. This includes oil or grease spills. What can I do in the bathroom? Use night lights. Install grab bars by the toilet and in the tub and shower. Do not use towel bars as grab bars. Use non-skid mats or decals in the tub or shower. If you need to sit down in the shower, use a plastic, non-slip stool. Keep the floor dry. Clean up any water that spills on the floor as soon as it happens. Remove soap buildup in the tub or shower  regularly. Attach bath mats securely with double-sided non-slip rug tape. Do not have throw rugs and other things on the floor that can make you trip. What can I do in the bedroom? Use night lights. Make sure that you have a light by your bed that is easy to reach. Do not use any sheets or blankets that are too big for your bed. They should not hang down onto the floor. Have a firm chair that has side arms. You can use this for support while you get dressed. Do not have throw rugs and other things on the floor that can make you trip. What can I do in the kitchen? Clean up any spills right away. Avoid walking on wet floors. Keep items that you use a lot in easy-to-reach places. If you need to reach something above you, use a strong step stool that has a grab bar. Keep electrical cords out of the way. Do not use floor polish or wax that makes floors slippery. If you must use wax, use non-skid floor wax. Do not have throw rugs and other things on the floor that can make you trip. What can I do with my stairs? Do not leave any items on the stairs. Make sure that there are  handrails on both sides of the stairs and use them. Fix handrails that are broken or loose. Make sure that handrails are as long as the stairways. Check any carpeting to make sure that it is firmly attached to the stairs. Fix any carpet that is loose or worn. Avoid having throw rugs at the top or bottom of the stairs. If you do have throw rugs, attach them to the floor with carpet tape. Make sure that you have a light switch at the top of the stairs and the bottom of the stairs. If you do not have them, ask someone to add them for you. What else can I do to help prevent falls? Wear shoes that: Do not have high heels. Have rubber bottoms. Are comfortable and fit you well. Are closed at the toe. Do not wear sandals. If you use a stepladder: Make sure that it is fully opened. Do not climb a closed stepladder. Make sure that  both sides of the stepladder are locked into place. Ask someone to hold it for you, if possible. Clearly mark and make sure that you can see: Any grab bars or handrails. First and last steps. Where the edge of each step is. Use tools that help you move around (mobility aids) if they are needed. These include: Canes. Walkers. Scooters. Crutches. Turn on the lights when you go into a dark area. Replace any light bulbs as soon as they burn out. Set up your furniture so you have a clear path. Avoid moving your furniture around. If any of your floors are uneven, fix them. If there are any pets around you, be aware of where they are. Review your medicines with your doctor. Some medicines can make you feel dizzy. This can increase your chance of falling. Ask your doctor what other things that you can do to help prevent falls. This information is not intended to replace advice given to you by your health care provider. Make sure you discuss any questions you have with your health care provider. Document Released: 06/03/2009 Document Revised: 01/13/2016 Document Reviewed: 09/11/2014 Elsevier Interactive Patient Education  2017 [employer].

## 2023-04-04 ENCOUNTER — Ambulatory Visit (INDEPENDENT_AMBULATORY_CARE_PROVIDER_SITE_OTHER): Payer: Medicare Other | Admitting: Internal Medicine

## 2023-04-04 ENCOUNTER — Encounter: Payer: Self-pay | Admitting: Internal Medicine

## 2023-04-04 VITALS — BP 130/70 | HR 73 | Temp 98.2°F | Ht 62.0 in | Wt 137.4 lb

## 2023-04-04 DIAGNOSIS — G2581 Restless legs syndrome: Secondary | ICD-10-CM | POA: Diagnosis not present

## 2023-04-04 DIAGNOSIS — G3184 Mild cognitive impairment, so stated: Secondary | ICD-10-CM

## 2023-04-04 DIAGNOSIS — I10 Essential (primary) hypertension: Secondary | ICD-10-CM

## 2023-04-04 DIAGNOSIS — M65331 Trigger finger, right middle finger: Secondary | ICD-10-CM | POA: Diagnosis not present

## 2023-04-04 DIAGNOSIS — E782 Mixed hyperlipidemia: Secondary | ICD-10-CM | POA: Diagnosis not present

## 2023-04-04 DIAGNOSIS — N2889 Other specified disorders of kidney and ureter: Secondary | ICD-10-CM | POA: Diagnosis not present

## 2023-04-04 DIAGNOSIS — N1831 Chronic kidney disease, stage 3a: Secondary | ICD-10-CM

## 2023-04-04 LAB — LIPID PANEL
Cholesterol: 172 mg/dL (ref 0–200)
HDL: 59 mg/dL (ref >39.00)
NonHDL: 112.94
Total CHOL/HDL Ratio: 3
Triglycerides: 319 mg/dL — ABNORMAL HIGH (ref 0.0–149.0)
VLDL: 63.8 mg/dL — ABNORMAL HIGH (ref 0.0–40.0)

## 2023-04-04 LAB — COMPREHENSIVE METABOLIC PANEL
ALT: 26 U/L (ref 0–35)
AST: 26 U/L (ref 0–37)
Albumin: 4.1 g/dL (ref 3.5–5.2)
Alkaline Phosphatase: 61 U/L (ref 39–117)
BUN: 22 mg/dL (ref 6–23)
CO2: 30 mEq/L (ref 19–32)
Calcium: 9.6 mg/dL (ref 8.4–10.5)
Chloride: 102 mEq/L (ref 96–112)
Creatinine, Ser: 0.97 mg/dL (ref 0.40–1.20)
GFR: 55.78 mL/min — ABNORMAL LOW (ref >60.00)
Glucose, Bld: 89 mg/dL (ref 70–99)
Potassium: 4 mEq/L (ref 3.5–5.1)
Sodium: 136 mEq/L (ref 135–145)
Total Bilirubin: 0.7 mg/dL (ref 0.2–1.2)
Total Protein: 6.7 g/dL (ref 6.0–8.3)

## 2023-04-04 LAB — LDL CHOLESTEROL, DIRECT: Direct LDL: 94 mg/dL

## 2023-04-04 NOTE — Assessment & Plan Note (Signed)
Chronic Regular exercise and healthy diet encouraged Check lipid panel today Start crestor 5 mg daily

## 2023-04-04 NOTE — Assessment & Plan Note (Addendum)
Acute Intermittent Finger is painful Symptoms controllable She will let me know if she needs treatment-referral

## 2023-04-04 NOTE — Assessment & Plan Note (Signed)
Chronic Blood pressure well controlled CMP Continue losartan 25 mg daily 

## 2023-04-04 NOTE — Assessment & Plan Note (Addendum)
Chronic Currently controlled-has gotten a little bit worse Currently taking Requip to 1.5 mg at night Taking 0.5 mg requip during the day as needed Continue above

## 2023-04-04 NOTE — Assessment & Plan Note (Signed)
Chronic Very mild decreased kidney function that has been somewhat fluctuating No obvious cause-blood pressure well-controlled, does not have diabetes Does not take NSAIDs regularly and advised to only take as needed Advised increased water intake, low-sodium diet Will check ultrasound of kidneys-she states her mom had only 1 functioning kidney CMP

## 2023-04-05 ENCOUNTER — Telehealth: Payer: Self-pay | Admitting: Internal Medicine

## 2023-04-05 NOTE — Telephone Encounter (Signed)
Spoke with patient today and all questions answered.  

## 2023-04-05 NOTE — Telephone Encounter (Signed)
Pt called wanting to know why referral was sent to radiology on 04/04/2023. Please update pt.

## 2023-04-06 DIAGNOSIS — H903 Sensorineural hearing loss, bilateral: Secondary | ICD-10-CM | POA: Diagnosis not present

## 2023-04-06 DIAGNOSIS — H8103 Meniere's disease, bilateral: Secondary | ICD-10-CM | POA: Diagnosis not present

## 2023-04-06 DIAGNOSIS — Z882 Allergy status to sulfonamides status: Secondary | ICD-10-CM | POA: Diagnosis not present

## 2023-04-06 DIAGNOSIS — Z79899 Other long term (current) drug therapy: Secondary | ICD-10-CM | POA: Diagnosis not present

## 2023-04-06 DIAGNOSIS — H9312 Tinnitus, left ear: Secondary | ICD-10-CM | POA: Diagnosis not present

## 2023-04-09 ENCOUNTER — Ambulatory Visit
Admission: RE | Admit: 2023-04-09 | Discharge: 2023-04-09 | Disposition: A | Discharge status: Home / Self Care | Payer: Medicare Other | Source: Ambulatory Visit | Attending: Internal Medicine | Admitting: Internal Medicine

## 2023-04-09 DIAGNOSIS — N189 Chronic kidney disease, unspecified: Secondary | ICD-10-CM | POA: Diagnosis not present

## 2023-04-09 DIAGNOSIS — N1831 Chronic kidney disease, stage 3a: Secondary | ICD-10-CM

## 2023-04-15 NOTE — Addendum Note (Signed)
Addended by: Pincus Sanes on: 04/15/2023 12:14 PM   Modules accepted: Orders

## 2023-05-09 ENCOUNTER — Encounter: Payer: Self-pay | Admitting: Urology

## 2023-05-09 ENCOUNTER — Ambulatory Visit (INDEPENDENT_AMBULATORY_CARE_PROVIDER_SITE_OTHER): Payer: Medicare Other | Admitting: Urology

## 2023-05-09 VITALS — BP 164/83 | HR 67 | Ht 62.0 in | Wt 135.0 lb

## 2023-05-09 DIAGNOSIS — N1330 Unspecified hydronephrosis: Secondary | ICD-10-CM

## 2023-05-09 DIAGNOSIS — N289 Disorder of kidney and ureter, unspecified: Secondary | ICD-10-CM | POA: Diagnosis not present

## 2023-05-09 LAB — URINALYSIS, ROUTINE W REFLEX MICROSCOPIC
Bilirubin, UA: NEGATIVE
Glucose, UA: NEGATIVE
Ketones, UA: NEGATIVE
Nitrite, UA: NEGATIVE
Protein,UA: NEGATIVE
Specific Gravity, UA: 1.02 (ref 1.005–1.030)
Urobilinogen, Ur: 0.2 mg/dL (ref 0.2–1.0)
pH, UA: 8.5 — ABNORMAL HIGH (ref 5.0–7.5)

## 2023-05-09 LAB — MICROSCOPIC EXAMINATION

## 2023-05-09 LAB — BLADDER SCAN AMB NON-IMAGING

## 2023-05-09 NOTE — Progress Notes (Signed)
Assessment: 1. Renal function impairment     Plan: I personally reviewed the patient's chart including provider notes, labs and imaging results. I personally reviewed the renal ultrasound from 04/12/2023.  I do not see any evidence of any significant obstruction involving the right kidney.  The findings are likely due to an extra-renal pelvis. Recommend follow-up of renal function. Consider repeat renal U/S if renal functioning is worsening. Return to office prn.    Chief Complaint:  Chief Complaint  Patient presents with   Hydronephrosis    History of Present Illness:  Ruth Jones is a 77 y.o. female who is seen in consultation from Pincus Sanes, MD for evaluation of right pelviectasis seen on recent ultrasound. She had went a renal ultrasound for evaluation of abnormal renal function on 04/12/2023.  The study showed no renal masses and right pelviectasis versus small extrarenal pelvis. Creatinine from 8/24: 0.97.  Calculated GFR 55. No flank pain.  No dysuria or gross hematuria.  No recent UTIs.  She reports some occasional urgency and nocturia x 1. No history of kidney stones.   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   Mild cognitive impairment with memory loss 03/28/2022   Muscle cramping 05/04/2020   Osteopenia    Plantar fasciitis, bilateral 10/22/2018   Raynaud's phenomenon 04/04/2016   Nose, hands, feet   RLS (restless legs syndrome) 05/04/2020   Sensorineural hearing loss  (SNHL) of both ears 05/09/2019   Snoring 01/17/2012    Past Surgical History:  Past Surgical History:  Procedure Laterality Date   COLONOSCOPY W/ POLYPECTOMY     MOHS SURGERY  2016   melanoma RLE   UPPER GASTROINTESTINAL ENDOSCOPY  2011   Dr Marina Goodell   VESICOVAGINAL FISTULA CLOSURE W/ TAH  1990    Allergies:  Allergies  Allergen Reactions   Levofloxacin     REACTION: GI Intolerance as "horrible stomach pain"   Metoprolol Succinate     REACTION: Tachycardia   Penicillins     REACTION: yeast   Sulfonamide Derivatives     REACTION: itching    Family History:  Family History  Problem Relation Age of Onset   Heart disease Father    Osteoporosis Mother    Lymphoma Mother        non-hodgkins   Stroke Neg Hx    Diabetes Neg Hx    Colon cancer Neg Hx     Social History:  Social History   Tobacco Use   Smoking status: Never   Smokeless tobacco: Never  Vaping Use   Vaping status: Never Used  Substance Use Topics   Alcohol use: Yes    Alcohol/week: 7.0 standard drinks of alcohol    Types: 7 Glasses of wine per week    Comment: 1 glass of wine nightly   Drug use: No    Review of symptoms:  Constitutional:  Negative for unexplained weight loss, night sweats, fever, chills ENT:  Negative for nose bleeds, sinus  pain, painful swallowing CV:  Negative for chest pain, shortness of breath, exercise intolerance, palpitations, loss of consciousness Resp:  Negative for cough, wheezing, shortness of breath GI:  Negative for nausea, vomiting, diarrhea, bloody stools GU:  Positives noted in HPI; otherwise negative for gross hematuria, dysuria, urinary incontinence Neuro:  Negative for seizures, poor balance, limb weakness, slurred speech Psych:  Negative for lack of energy, depression, anxiety Endocrine:  Negative for polydipsia, polyuria, symptoms of hypoglycemia (dizziness, hunger, sweating) Hematologic:  Negative for anemia, purpura, petechia, prolonged or excessive bleeding,  use of anticoagulants  Allergic:  Negative for difficulty breathing or choking as a result of exposure to anything; no shellfish allergy; no allergic response (rash/itch) to materials, foods  Physical exam: BP (!) 164/83   Pulse 67   Ht 5\' 2"  (1.575 m)   Wt 135 lb (61.2 kg)   BMI 24.69 kg/m  GENERAL APPEARANCE:  Well appearing, well developed, well nourished, NAD HEENT: Atraumatic, Normocephalic, oropharynx clear. NECK: Supple without lymphadenopathy or thyromegaly. LUNGS: Clear to auscultation bilaterally. HEART: Regular Rate and Rhythm without murmurs, gallops, or rubs. ABDOMEN: Soft, non-tender, No Masses. EXTREMITIES: Moves all extremities well.  Without clubbing, cyanosis, or edema. NEUROLOGIC:  Alert and oriented x 3, normal gait, CN II-XII grossly intact.  MENTAL STATUS:  Appropriate. BACK:  Non-tender to palpation.  No CVAT SKIN:  Warm, dry and intact.    Results: U/A:  0-5 WBC, 0-2 RBC  PVR = 1 ml

## 2023-05-10 DIAGNOSIS — H04123 Dry eye syndrome of bilateral lacrimal glands: Secondary | ICD-10-CM | POA: Diagnosis not present

## 2023-05-10 DIAGNOSIS — H524 Presbyopia: Secondary | ICD-10-CM | POA: Diagnosis not present

## 2023-05-10 DIAGNOSIS — D3131 Benign neoplasm of right choroid: Secondary | ICD-10-CM | POA: Diagnosis not present

## 2023-05-10 DIAGNOSIS — H02834 Dermatochalasis of left upper eyelid: Secondary | ICD-10-CM | POA: Diagnosis not present

## 2023-05-10 DIAGNOSIS — H02831 Dermatochalasis of right upper eyelid: Secondary | ICD-10-CM | POA: Diagnosis not present

## 2023-05-11 ENCOUNTER — Telehealth: Payer: Self-pay | Admitting: Internal Medicine

## 2023-05-11 NOTE — Telephone Encounter (Signed)
Pt lost her Rx when she was on vacation pt will be calling back with the of the Rx.

## 2023-05-14 NOTE — Telephone Encounter (Signed)
Called and left message for patient this morning.  If she calls back please find out medication she is needing sent

## 2023-05-29 DIAGNOSIS — D485 Neoplasm of uncertain behavior of skin: Secondary | ICD-10-CM | POA: Diagnosis not present

## 2023-05-29 DIAGNOSIS — L821 Other seborrheic keratosis: Secondary | ICD-10-CM | POA: Diagnosis not present

## 2023-05-29 DIAGNOSIS — D0462 Carcinoma in situ of skin of left upper limb, including shoulder: Secondary | ICD-10-CM | POA: Diagnosis not present

## 2023-05-29 DIAGNOSIS — Z85828 Personal history of other malignant neoplasm of skin: Secondary | ICD-10-CM | POA: Diagnosis not present

## 2023-05-29 DIAGNOSIS — L570 Actinic keratosis: Secondary | ICD-10-CM | POA: Diagnosis not present

## 2023-05-29 DIAGNOSIS — D225 Melanocytic nevi of trunk: Secondary | ICD-10-CM | POA: Diagnosis not present

## 2023-05-29 DIAGNOSIS — Z85820 Personal history of malignant melanoma of skin: Secondary | ICD-10-CM | POA: Diagnosis not present

## 2023-05-29 DIAGNOSIS — D2262 Melanocytic nevi of left upper limb, including shoulder: Secondary | ICD-10-CM | POA: Diagnosis not present

## 2023-07-04 DIAGNOSIS — D0462 Carcinoma in situ of skin of left upper limb, including shoulder: Secondary | ICD-10-CM | POA: Diagnosis not present

## 2023-10-10 DIAGNOSIS — K080 Exfoliation of teeth due to systemic causes: Secondary | ICD-10-CM | POA: Diagnosis not present

## 2023-10-30 ENCOUNTER — Encounter: Payer: Self-pay | Admitting: Internal Medicine

## 2023-10-30 NOTE — Progress Notes (Unsigned)
    Subjective:    Patient ID: Ruth Jones, female    DOB: 09-23-1945, 78 y.o.   MRN: [id number]      HPI Ranika is here for No chief complaint on file.        Medications and allergies reviewed with patient and updated if appropriate.  Current Outpatient Medications on File Prior to Visit  Medication Sig Dispense Refill   Bacillus Coagulans-Inulin (ALIGN PREBIOTIC-PROBIOTIC PO) Take by mouth.     Black Pepper-Turmeric (TURMERIC COMPLEX/BLACK PEPPER PO) Take 1,000 mg by mouth. 1 capsule per day     Calcium-Magnesium-Vitamin D (CALCIUM MAGNESIUM PO) Take by mouth.     doxycycline (ADOXA) 75 MG tablet Take by mouth. rosea     estradiol (ESTRACE) 0.5 MG tablet Take 0.5 mg by mouth daily.     famotidine-calcium carbonate-magnesium hydroxide (PEPCID COMPLETE) 10-800-165 MG chewable tablet Chew 1 tablet by mouth daily as needed. 60 tablet    ferrous sulfate 325 (65 FE) MG tablet Take 325 mg by mouth daily. Take 1/2 daily     Homeopathic Products (LEG CRAMPS PO) Take by mouth. For restful leg     Homeopathic Products (THERAWORX RELIEF) FOAM Apply topically. Patient takes for restless legs     losartan (COZAAR) 25 MG tablet TAKE 1 TABLET(25 MG) BY MOUTH DAILY 90 tablet 2   metroNIDAZOLE (METROCREAM) 0.75 % cream Apply topically daily.     NONFORMULARY OR COMPOUNDED ITEM Relaxing Leg Cream  Rub on legs twice a day and as needed for RLS Disp 4 oz container 1 each 2   NONFORMULARY OR COMPOUNDED ITEM Homeopathic muscle cramp & spasm spray Spray onto legs twice daily and as needed Disp 7.1 fl oz 1 each 2   rOPINIRole (REQUIP) 0.5 MG tablet Take 1 tablet (0.5 mg total) by mouth 2 (two) times daily as needed. 60 tablet 5   rOPINIRole (REQUIP) 1 MG tablet Take 1 tablet (1 mg total) by mouth at bedtime. 90 tablet 3   rosuvastatin (CRESTOR) 5 MG tablet Take 1 tablet (5 mg total) by mouth daily. 90 tablet 3   UNABLE TO FIND 1 capsule. Med Name: women's daily probiotic trunature     vitamin  B-12 (CYANOCOBALAMIN) 1000 MCG tablet Take 1,000 mcg by mouth daily. (Patient not taking: Reported on 05/09/2023)     No current facility-administered medications on file prior to visit.    Review of Systems     Objective:  There were no vitals filed for this visit. BP Readings from Last 3 Encounters:  05/09/23 (!) 164/83  04/04/23 130/70  12/11/22 124/72   Wt Readings from Last 3 Encounters:  05/09/23 135 lb (61.2 kg)  04/04/23 137 lb 6.4 oz (62.3 kg)  04/03/23 135 lb (61.2 kg)   There is no height or weight on file to calculate BMI.    Physical Exam         Assessment & Plan:    See Problem List for Assessment and Plan of chronic medical problems.

## 2023-10-31 ENCOUNTER — Ambulatory Visit (INDEPENDENT_AMBULATORY_CARE_PROVIDER_SITE_OTHER): Payer: Medicaid Other | Admitting: Internal Medicine

## 2023-10-31 VITALS — BP 138/82 | HR 78 | Temp 98.0°F | Ht 62.0 in | Wt 141.0 lb

## 2023-10-31 DIAGNOSIS — G2581 Restless legs syndrome: Secondary | ICD-10-CM

## 2023-10-31 DIAGNOSIS — R7989 Other specified abnormal findings of blood chemistry: Secondary | ICD-10-CM | POA: Insufficient documentation

## 2023-10-31 DIAGNOSIS — R202 Paresthesia of skin: Secondary | ICD-10-CM | POA: Diagnosis not present

## 2023-10-31 DIAGNOSIS — M65331 Trigger finger, right middle finger: Secondary | ICD-10-CM | POA: Diagnosis not present

## 2023-10-31 MED ORDER — GABAPENTIN 100 MG PO CAPS: 100.0000 mg | ORAL_CAPSULE | Freq: Every evening | ORAL | 5 refills | Status: DC | PRN

## 2023-10-31 MED ORDER — ROPINIROLE HCL 1 MG PO TABS: 2.0000 mg | ORAL_TABLET | Freq: Every day | ORAL | Status: DC

## 2023-10-31 NOTE — Assessment & Plan Note (Addendum)
 Chronic Has gotten worse Slightly worse - in the morning has to open it up [facility] Discussed that this could continue to get worse and recommend that she see orthopedics Referral order

## 2023-10-31 NOTE — Assessment & Plan Note (Signed)
 Chronic She does have a family history-her mother had a Has been taking Requip 1.5 mg nightly and symptoms recently got worse and she increased the dose to 2 mg which has helped the last few nights Okay to continue 2 mg nightly, but discussed I do not want her to increase this further Will prescribe gabapentin 100 mg nightly-right now if she is okay she will not start it, but will start it if her symptoms worsen again Can consider trying to decrease the Requip at some point Restart iron daily-slow release Will check ferritin level at her routine appointment next month

## 2023-10-31 NOTE — Patient Instructions (Addendum)
     Medications changes include :   start B12 1000 mcg a day.  Restart an iron supplement daily- try a slow release iron.    Gabapentin sent to your pharmacy.  Start gabapentin 100 mg at bedtime if the RLS gets worse. If your RLS is controlled you can hold off on taking it.   You will take this with the requip when needed at bedtime.   Continue the requip 2 mg at bedtime.    A referral was ordered orthopedics - [facility] and someone will call you to schedule an appointment.     Return for follow up as scheduled.

## 2023-10-31 NOTE — Assessment & Plan Note (Signed)
 Chronic History of low B12 Was doing supplementation at 1 point, but stopped Restart B12 daily-recommended that she take this forever

## 2023-10-31 NOTE — Assessment & Plan Note (Signed)
 No Bilateral fingers She currently is not taking vitamin B12 and did have a low level in the past-did take B12 for a while, but stopped and is not sure why she stopped Restart vitamin B12 Can check B12 level at her routine visit next month Referral to orthopedics

## 2023-11-08 ENCOUNTER — Ambulatory Visit: Payer: Self-pay

## 2023-11-08 NOTE — Telephone Encounter (Signed)
 Copied from CRM [id number]. Topic: Clinical - Medication Question >> Nov 08, 2023 11:24 AM Sim Boast F wrote: Reason for CRM: Patient seen Dr. Lawerance Bach 10/31/23, said the gabapentin is not working, wants to know does she need a stronger dose?   Patient called to report that she has been taking the Gabapentin but it has not been able to control her restless leg syndrome. She states that she is still waking up at night. Patient informed me she has not been taking her Requip and that one night she took both and was able to sleep through the night. I advised her that per her previous visit notes she should be taking the Requip at night and the Gabapentin as needed. Patient verbalized understanding and states she will begin doing that and will let us know if she continues to have any problems.    Reason for Disposition  Pharmacy calling with prescription question and triager answers question  Answer Assessment - Initial Assessment Questions 1. NAME of MEDICINE: "What medicine(s) are you calling about?"     Gabapentin  2. QUESTION: "What is your question?" (e.g., double dose of medicine, side effect)     Is not controlling her restless leg syndrome  3. PRESCRIBER: "Who prescribed the medicine?" Reason: if prescribed by specialist, call should be referred to that group.     Dr. Lawerance Bach  Protocols used: Medication Question Call-A-AH

## 2023-11-09 NOTE — Telephone Encounter (Signed)
 Noted  -- should be taking both meds.

## 2023-11-12 ENCOUNTER — Other Ambulatory Visit: Payer: Self-pay | Admitting: Internal Medicine

## 2023-11-12 ENCOUNTER — Telehealth: Payer: Self-pay | Admitting: Internal Medicine

## 2023-11-12 NOTE — Telephone Encounter (Signed)
 Copied from CRM [id number]. Topic: Clinical - Medication Refill >> Nov 12, 2023  2:44 PM Florestine Avers wrote: Most Recent Primary Care Visit:  Provider: Pincus Sanes  Department: [facility]  Visit Type: ACUTE  Date: 10/31/2023  Medication: losartan (COZAAR) 25 MG tablet  Has the patient contacted their pharmacy? Yes (Agent: If no, request that the patient contact the pharmacy for the refill. If patient does not wish to contact the pharmacy document the reason why and proceed with request.) (Agent: If yes, when and what did the pharmacy advise?)  Is this the correct pharmacy for this prescription? Yes If no, delete pharmacy and type the correct one.  This is the patient's preferred pharmacy:  [id number] - [place], [place] - [address] AT [facility] OF [address] [place] [address] Phone: [phone] Fax: [phone]   Has the prescription been filled recently? No  Is the patient out of the medication? Yes  Has the patient been seen for an appointment in the last year OR does the patient have an upcoming appointment? Yes  Can we respond through MyChart? No  Agent: Please be advised that Rx refills may take up to 3 business days. We ask that you follow-up with your pharmacy.

## 2023-11-12 NOTE — Telephone Encounter (Unsigned)
 Copied from CRM [id number]. Topic: Clinical - Prescription Issue >> Nov 12, 2023  2:47 PM Florestine Avers wrote: Reason for CRM: Patient called in stating that she needs a refill on her losartan (COZAAR) 25 MG tablet. She states that she is going out of town and is completely out of her medication and would like it sent to the pharmacy on file. There is also a pending refill request CRM in the system for the same medication refill.

## 2023-11-14 ENCOUNTER — Other Ambulatory Visit: Payer: Self-pay

## 2023-11-14 MED ORDER — LOSARTAN POTASSIUM 25 MG PO TABS: 25.0000 mg | ORAL_TABLET | Freq: Every day | ORAL | 2 refills | Status: DC

## 2023-11-14 NOTE — Telephone Encounter (Signed)
 Made pt aware of medication being send to her pharmacy, and to call to see when it will be ready for pick up.

## 2023-11-24 DIAGNOSIS — Z888 Allergy status to other drugs, medicaments and biological substances status: Secondary | ICD-10-CM | POA: Diagnosis not present

## 2023-11-24 DIAGNOSIS — R1111 Vomiting without nausea: Secondary | ICD-10-CM | POA: Diagnosis not present

## 2023-11-24 DIAGNOSIS — Z880 Allergy status to penicillin: Secondary | ICD-10-CM | POA: Diagnosis not present

## 2023-11-24 DIAGNOSIS — Z79899 Other long term (current) drug therapy: Secondary | ICD-10-CM | POA: Diagnosis not present

## 2023-11-24 DIAGNOSIS — R42 Dizziness and giddiness: Secondary | ICD-10-CM | POA: Diagnosis not present

## 2023-11-24 DIAGNOSIS — I10 Essential (primary) hypertension: Secondary | ICD-10-CM | POA: Diagnosis not present

## 2023-11-24 DIAGNOSIS — R519 Headache, unspecified: Secondary | ICD-10-CM | POA: Diagnosis not present

## 2023-11-24 DIAGNOSIS — Z881 Allergy status to other antibiotic agents status: Secondary | ICD-10-CM | POA: Diagnosis not present

## 2023-11-24 DIAGNOSIS — R112 Nausea with vomiting, unspecified: Secondary | ICD-10-CM | POA: Diagnosis not present

## 2023-11-24 DIAGNOSIS — R110 Nausea: Secondary | ICD-10-CM | POA: Diagnosis not present

## 2023-11-24 DIAGNOSIS — Z882 Allergy status to sulfonamides status: Secondary | ICD-10-CM | POA: Diagnosis not present

## 2023-11-25 DIAGNOSIS — R112 Nausea with vomiting, unspecified: Secondary | ICD-10-CM | POA: Diagnosis not present

## 2023-11-25 DIAGNOSIS — R42 Dizziness and giddiness: Secondary | ICD-10-CM | POA: Diagnosis not present

## 2023-11-25 DIAGNOSIS — R519 Headache, unspecified: Secondary | ICD-10-CM | POA: Diagnosis not present

## 2023-12-11 ENCOUNTER — Encounter: Payer: Self-pay | Admitting: Internal Medicine

## 2023-12-11 NOTE — Progress Notes (Unsigned)
 Subjective:    Patient ID: Ruth Jones, female    DOB: [birth year], 78 y.o.   MRN: [id number]      HPI Ruth Jones is here for a Physical exam and her chronic medical problems.   In ED 4/5 for vertigo.  Ruth Jones was prescribed Compazine and Antivert.  Blood work, EKG and CT of the head unremarkable.   Medications and allergies reviewed with patient and updated if appropriate.  Current Outpatient Medications on File Prior to Visit  Medication Sig Dispense Refill   Bacillus Coagulans-Inulin (ALIGN PREBIOTIC-PROBIOTIC PO) Take by mouth.     Black Pepper-Turmeric (TURMERIC COMPLEX/BLACK PEPPER PO) Take 1,000 mg by mouth. 1 capsule per day     Calcium -Magnesium-Vitamin D  (CALCIUM  MAGNESIUM PO) Take by mouth.     doxycycline  (ADOXA) 75 MG tablet Take by mouth. rosea     estradiol (ESTRACE) 0.5 MG tablet Take 0.5 mg by mouth daily.     famotidine -calcium  carbonate-magnesium hydroxide (PEPCID  COMPLETE) 10-800-165 MG chewable tablet Chew 1 tablet by mouth daily as needed. 60 tablet    ferrous sulfate 325 (65 FE) MG tablet Take 325 mg by mouth daily. Take 1/2 daily     gabapentin  (NEURONTIN ) 100 MG capsule Take 1 capsule (100 mg total) by mouth at bedtime as needed (RLS). 30 capsule 5   Homeopathic Products (LEG CRAMPS PO) Take by mouth. For restful leg     Homeopathic Products (THERAWORX RELIEF) FOAM Apply topically. Patient takes for restless legs     losartan  (COZAAR ) 25 MG tablet Take 1 tablet (25 mg total) by mouth daily. TAKE 1 TABLET(25 MG) BY MOUTH DAILY 90 tablet 2   metroNIDAZOLE (METROCREAM) 0.75 % cream Apply topically daily.     NONFORMULARY OR COMPOUNDED ITEM Relaxing Leg Cream  Rub on legs twice a day and as needed for RLS Disp 4 oz container 1 each 2   NONFORMULARY OR COMPOUNDED ITEM Homeopathic muscle cramp & spasm spray Spray onto legs twice daily and as needed Disp 7.1 fl oz 1 each 2   rOPINIRole  (REQUIP ) 1 MG tablet Take 2 tablets (2 mg total) by mouth at bedtime.      rosuvastatin  (CRESTOR ) 5 MG tablet Take 1 tablet (5 mg total) by mouth daily. 90 tablet 3   UNABLE TO FIND 1 capsule. Med Name: women's daily probiotic trunature     vitamin B-12 (CYANOCOBALAMIN ) 1000 MCG tablet Take 1,000 mcg by mouth daily.     No current facility-administered medications on file prior to visit.    Review of Systems     Objective:  There were no vitals filed for this visit. There were no vitals filed for this visit. There is no height or weight on file to calculate BMI.  BP Readings from Last 3 Encounters:  10/31/23 138/82  05/09/23 (!) 164/83  04/04/23 130/70    Wt Readings from Last 3 Encounters:  10/31/23 141 lb (64 kg)  05/09/23 135 lb (61.2 kg)  04/04/23 137 lb 6.4 oz (62.3 kg)       Physical Exam Constitutional: Ruth Jones appears well-developed and well-nourished. No distress.  HENT:  Head: Normocephalic and atraumatic.  Right Ear: External ear normal. Normal ear canal and TM Left Ear: External ear normal.  Normal ear canal and TM Mouth/Throat: Oropharynx is clear and moist.  Eyes: Conjunctivae normal.  Neck: Neck supple. No tracheal deviation present. No thyromegaly present.  No carotid bruit  Cardiovascular: Normal rate, regular rhythm and normal heart sounds.   No  murmur heard.  No edema. Pulmonary/Chest: Effort normal and breath sounds normal. No respiratory distress. Ruth Jones has no wheezes. Ruth Jones has no rales.  Breast: deferred   Abdominal: Soft. Ruth Jones exhibits no distension. There is no tenderness.  Lymphadenopathy: Ruth Jones has no cervical adenopathy.  Skin: Skin is warm and dry. Ruth Jones is not diaphoretic.  Psychiatric: Ruth Jones has a normal mood and affect. Her behavior is normal.     Lab Results  Component Value Date   WBC 8.2 12/11/2022   HGB 15.2 (H) 12/11/2022   HCT 45.2 12/11/2022   PLT 299.0 12/11/2022   GLUCOSE 89 04/04/2023   CHOL 172 04/04/2023   TRIG 319.0 (H) 04/04/2023   HDL 59.00 04/04/2023   LDLDIRECT 94.0 04/04/2023   LDLCALC 68  02/09/2023   ALT 26 04/04/2023   AST 26 04/04/2023   NA 136 04/04/2023   K 4.0 04/04/2023   CL 102 04/04/2023   CREATININE 0.97 04/04/2023   BUN 22 04/04/2023   CO2 30 04/04/2023   TSH 1.51 12/11/2022         Assessment & Plan:   Physical exam: Screening blood work  ordered Exercise   Weight   Substance abuse  none   Reviewed recommended immunizations.   Health Maintenance  Topic Date Due   COVID-19 Vaccine (5 - 2024-25 season) 04/22/2023   DEXA SCAN  05/25/2023   INFLUENZA VACCINE  03/21/2024   Medicare Annual Wellness (AWV)  04/02/2024   DTaP/Tdap/Td (3 - Td or Tdap) 02/01/2025   Pneumonia Vaccine 68+ Years old  Completed   Hepatitis C Screening  Completed   Zoster Vaccines- Shingrix  Completed   HPV VACCINES  Aged Out   Meningococcal B Vaccine  Aged Out   Colonoscopy  Discontinued          See Problem List for Assessment and Plan of chronic medical problems.

## 2023-12-11 NOTE — Patient Instructions (Addendum)
 No blood work today - we will do it at your next visit.     Medications changes include :   stop losartan  and start dyazide 37.5-25 mg daily     Return in about 3 weeks (around 01/02/2024) for follow up htn, dizziness, Schedule DEXA-[place].   Health Maintenance, Female Adopting a healthy lifestyle and getting preventive care are important in promoting health and wellness. Ask your health care provider about: The right schedule for you to have regular tests and exams. Things you can do on your own to prevent diseases and keep yourself healthy. What should I know about diet, weight, and exercise? Eat a healthy diet  Eat a diet that includes plenty of vegetables, fruits, low-fat dairy products, and lean protein. Do not eat a lot of foods that are high in solid fats, added sugars, or sodium. Maintain a healthy weight Body mass index (BMI) is used to identify weight problems. It estimates body fat based on height and weight. Your health care provider can help determine your BMI and help you achieve or maintain a healthy weight. Get regular exercise Get regular exercise. This is one of the most important things you can do for your health. Most adults should: Exercise for at least 150 minutes each week. The exercise should increase your heart rate and make you sweat (moderate-intensity exercise). Do strengthening exercises at least twice a week. This is in addition to the moderate-intensity exercise. Spend less time sitting. Even light physical activity can be beneficial. Watch cholesterol and blood lipids Have your blood tested for lipids and cholesterol at 80 years of age, then have this test every 5 years. Have your cholesterol levels checked more often if: Your lipid or cholesterol levels are high. You are older than 80 years of age. You are at high risk for heart disease. What should I know about cancer screening? Depending on your health history and family history, you may need to  have cancer screening at various ages. This may include screening for: Breast cancer. Cervical cancer. Colorectal cancer. Skin cancer. Lung cancer. What should I know about heart disease, diabetes, and high blood pressure? Blood pressure and heart disease High blood pressure causes heart disease and increases the risk of stroke. This is more likely to develop in people who have high blood pressure readings or are overweight. Have your blood pressure checked: Every 3-5 years if you are 36-93 years of age. Every year if you are 81 years old or older. Diabetes Have regular diabetes screenings. This checks your fasting blood sugar level. Have the screening done: Once every three years after age 60 if you are at a normal weight and have a low risk for diabetes. More often and at a younger age if you are overweight or have a high risk for diabetes. What should I know about preventing infection? Hepatitis B If you have a higher risk for hepatitis B, you should be screened for this virus. Talk with your health care provider to find out if you are at risk for hepatitis B infection. Hepatitis C Testing is recommended for: Everyone born from 18 through 1965. Anyone with known risk factors for hepatitis C. Sexually transmitted infections (STIs) Get screened for STIs, including gonorrhea and chlamydia, if: You are sexually active and are younger than 80 years of age. You are older than 80 years of age and your health care provider tells you that you are at risk for this type of infection. Your sexual activity has  changed since you were last screened, and you are at increased risk for chlamydia or gonorrhea. Ask your health care provider if you are at risk. Ask your health care provider about whether you are at high risk for HIV. Your health care provider may recommend a prescription medicine to help prevent HIV infection. If you choose to take medicine to prevent HIV, you should first get tested for  HIV. You should then be tested every 3 months for as long as you are taking the medicine. Pregnancy If you are about to stop having your period (premenopausal) and you may become pregnant, seek counseling before you get pregnant. Take 400 to 800 micrograms (mcg) of folic acid every day if you become pregnant. Ask for birth control (contraception) if you want to prevent pregnancy. Osteoporosis and menopause Osteoporosis is a disease in which the bones lose minerals and strength with aging. This can result in bone fractures. If you are 69 years old or older, or if you are at risk for osteoporosis and fractures, ask your health care provider if you should: Be screened for bone loss. Take a calcium  or vitamin D  supplement to lower your risk of fractures. Be given hormone replacement therapy (HRT) to treat symptoms of menopause. Follow these instructions at home: Alcohol use Do not drink alcohol if: Your health care provider tells you not to drink. You are pregnant, may be pregnant, or are planning to become pregnant. If you drink alcohol: Limit how much you have to: 0-1 drink a day. Know how much alcohol is in your drink. In the [place] one drink equals one 12 oz bottle of beer (355 mL), one 5 oz glass of wine (148 mL), or one 1 oz glass of hard liquor (44 mL). Lifestyle Do not use any products that contain nicotine or tobacco. These products include cigarettes, chewing tobacco, and vaping devices, such as e-cigarettes. If you need help quitting, ask your health care provider. Do not use street drugs. Do not share needles. Ask your health care provider for help if you need support or information about quitting drugs. General instructions Schedule regular health, dental, and eye exams. Stay current with your vaccines. Tell your health care provider if: You often feel depressed. You have ever been abused or do not feel safe at home. Summary Adopting a healthy lifestyle and getting preventive  care are important in promoting health and wellness. Follow your health care provider's instructions about healthy diet, exercising, and getting tested or screened for diseases. Follow your health care provider's instructions on monitoring your cholesterol and blood pressure. This information is not intended to replace advice given to you by your health care provider. Make sure you discuss any questions you have with your health care provider. Document Revised: 12/27/2020 Document Reviewed: 12/27/2020 Elsevier Patient Education  2024 [employer].

## 2023-12-12 ENCOUNTER — Ambulatory Visit (INDEPENDENT_AMBULATORY_CARE_PROVIDER_SITE_OTHER): Payer: Medicare Other | Admitting: Internal Medicine

## 2023-12-12 VITALS — BP 132/82 | HR 70 | Temp 98.5°F | Ht 62.0 in | Wt 143.2 lb

## 2023-12-12 DIAGNOSIS — M8589 Other specified disorders of bone density and structure, multiple sites: Secondary | ICD-10-CM

## 2023-12-12 DIAGNOSIS — N1831 Chronic kidney disease, stage 3a: Secondary | ICD-10-CM | POA: Diagnosis not present

## 2023-12-12 DIAGNOSIS — Z0000 Encounter for general adult medical examination without abnormal findings: Secondary | ICD-10-CM

## 2023-12-12 DIAGNOSIS — E782 Mixed hyperlipidemia: Secondary | ICD-10-CM | POA: Diagnosis not present

## 2023-12-12 DIAGNOSIS — G2581 Restless legs syndrome: Secondary | ICD-10-CM

## 2023-12-12 DIAGNOSIS — I10 Essential (primary) hypertension: Secondary | ICD-10-CM

## 2023-12-12 DIAGNOSIS — E2839 Other primary ovarian failure: Secondary | ICD-10-CM

## 2023-12-12 DIAGNOSIS — H8102 Meniere's disease, left ear: Secondary | ICD-10-CM | POA: Diagnosis not present

## 2023-12-12 DIAGNOSIS — R7989 Other specified abnormal findings of blood chemistry: Secondary | ICD-10-CM

## 2023-12-12 MED ORDER — TRIAMTERENE-HCTZ 37.5-25 MG PO CAPS: 1.0000 | ORAL_CAPSULE | Freq: Every day | ORAL | 2 refills | Status: DC

## 2023-12-12 NOTE — Assessment & Plan Note (Addendum)
 Chronic Regular exercise and healthy diet encouraged Check lipid panel, CMP, TSH continue crestor  5 mg daily

## 2023-12-12 NOTE — Assessment & Plan Note (Signed)
 Chronic Mild No obvious cause-blood pressure well-controlled, does not have diabetes Does not take NSAIDs regularly and advised to only take as needed Advised increased water intake, low-sodium diet Ultrasound kidney showed right pelviectasis or small extrarenal pelvis-no hydronephrosis CMP, CBC

## 2023-12-12 NOTE — Assessment & Plan Note (Signed)
 Chronic dexa due - ordered Continue calcium  and vitamin D  Continue regular exercise Ck vitamin d  level

## 2023-12-12 NOTE — Assessment & Plan Note (Addendum)
 Chronic Blood pressure well controlled CMP Will stop losartan  so we can start dyazide 37.5-25 mg daily F/u in 3 weeks - blood work at that time

## 2023-12-12 NOTE — Assessment & Plan Note (Signed)
 Chronic History of low B12 Check vitamin B12 level

## 2023-12-12 NOTE — Assessment & Plan Note (Addendum)
 Chronic She does have a family history-her mother had RLS Currently controlled-she did have to add in the gabapentin  and that has helped Continue requip  2 mg nightly continue gabapentin  100 mg nightly Taking slow release iron daily Exercising regularly

## 2023-12-12 NOTE — Assessment & Plan Note (Signed)
 Chronic Diagnosed years ago by Dr. Margurette Shillings Has been asymptomatic for a while Having dizziness could be consistent with Mnire's disease versus migraine with vertigo-she does have a history of migraines Will restart Dyazide which she did well within the past 37.5-25 mg daily-stop losartan  Follow-up in 3 weeks

## 2023-12-18 ENCOUNTER — Ambulatory Visit (INDEPENDENT_AMBULATORY_CARE_PROVIDER_SITE_OTHER)
Admission: RE | Admit: 2023-12-18 | Discharge: 2023-12-18 | Disposition: A | Discharge status: Home / Self Care | Source: Ambulatory Visit | Attending: Internal Medicine | Admitting: Internal Medicine

## 2023-12-18 DIAGNOSIS — M8589 Other specified disorders of bone density and structure, multiple sites: Secondary | ICD-10-CM | POA: Diagnosis not present

## 2023-12-19 ENCOUNTER — Other Ambulatory Visit: Payer: Self-pay | Admitting: Internal Medicine

## 2023-12-19 NOTE — Telephone Encounter (Signed)
 Copied from CRM [id number]. Topic: Clinical - Medication Refill >> Dec 19, 2023  2:58 PM Orien Bird wrote: Most Recent Primary Care Visit:  Provider: BURNS, Beckey Bourgeois  Department: [facility]  Visit Type: PHYSICAL  Date: 12/12/2023  Medication: rOPINIRole  (REQUIP ) 1 MG tablet  Has the patient contacted their pharmacy? Yes (Agent: If no, request that the patient contact the pharmacy for the refill. If patient does not wish to contact the pharmacy document the reason why and proceed with request.) (Agent: If yes, when and what did the pharmacy advise?)  Is this the correct pharmacy for this prescription? Yes If no, delete pharmacy and type the correct one.  This is the patient's preferred pharmacy:  [id number] - [place], [place] - [address] AT [facility] OF [address] [place] [address] Phone: [phone] Fax: [phone]   Has the prescription been filled recently? No  Is the patient out of the medication? Yes  Has the patient been seen for an appointment in the last year OR does the patient have an upcoming appointment? Yes  Can we respond through MyChart? No  Agent: Please be advised that Rx refills may take up to 3 business days. We ask that you follow-up with your pharmacy.

## 2023-12-24 ENCOUNTER — Other Ambulatory Visit: Payer: Self-pay | Admitting: Internal Medicine

## 2023-12-31 ENCOUNTER — Other Ambulatory Visit: Payer: Self-pay | Admitting: Internal Medicine

## 2024-01-01 ENCOUNTER — Encounter: Payer: Self-pay | Admitting: Internal Medicine

## 2024-01-01 NOTE — Patient Instructions (Addendum)
      Blood work was ordered.       Medications changes include :   None    A referral was ordered and someone will call you to schedule an appointment.     Return in about 6 months (around 07/04/2024) for follow up.

## 2024-01-01 NOTE — Progress Notes (Unsigned)
 Subjective:    Patient ID: Ruth Jones, female    DOB: Mar 14, 1946, 78 y.o.   MRN: [id number]     HPI Ruth Jones is here for follow up of her chronic medical problems.  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  We stopped losartan  and started Dyazide 37.5-25 mg daily.  She does have an appointment with Dr. Jodelle Mungo on 04/10/2024  Blood work pending for her to get done today  Right upper arm - very mild ache. Going on for a while.   No weakness in hands.   She denies any neck pain or shoulder pain.  The ache is very mild.  It is not worse with any certain activity  R middle finger trigger.  Still tender especially with certain activities.  She is able to make a fist.  She does not have the strength that she used to have.  It is painful, but is not interfering with her ADLs right now.  Medications and allergies reviewed with patient and updated if appropriate.  Current Outpatient Medications on File Prior to Visit  Medication Sig Dispense Refill   Bacillus Coagulans-Inulin (ALIGN PREBIOTIC-PROBIOTIC PO) Take by mouth.     Black Pepper-Turmeric (TURMERIC COMPLEX/BLACK PEPPER PO) Take 1,000 mg by mouth. 1 capsule per day     Calcium -Magnesium-Vitamin D  (CALCIUM  MAGNESIUM PO) Take by mouth.     doxycycline  (ADOXA) 75 MG tablet Take by mouth. rosea     estradiol (ESTRACE) 0.5 MG tablet Take 0.5 mg by mouth daily.     famotidine -calcium  carbonate-magnesium hydroxide (PEPCID  COMPLETE) 10-800-165 MG chewable tablet Chew 1 tablet by mouth daily as needed. 60 tablet    ferrous sulfate 325 (65 FE) MG tablet Take 325 mg by mouth daily. Take 1/2 daily     gabapentin  (NEURONTIN ) 100 MG capsule Take 1 capsule (100 mg total) by mouth at bedtime as needed (RLS). 30 capsule 5   Homeopathic Products (LEG CRAMPS PO) Take by mouth. For restful leg     Homeopathic Products (THERAWORX RELIEF) FOAM Apply topically. Patient takes for restless legs      metroNIDAZOLE (METROCREAM) 0.75 % cream Apply topically daily.     NONFORMULARY OR COMPOUNDED ITEM Relaxing Leg Cream  Rub on legs twice a day and as needed for RLS Disp 4 oz container 1 each 2   NONFORMULARY OR COMPOUNDED ITEM Homeopathic muscle cramp & spasm spray Spray onto legs twice daily and as needed Disp 7.1 fl oz 1 each 2   rOPINIRole  (REQUIP ) 1 MG tablet TAKE 1 TABLET(1 MG) BY MOUTH THREE TIMES DAILY 270 tablet 3   rosuvastatin  (CRESTOR ) 5 MG tablet TAKE 1 TABLET(5 MG) BY MOUTH DAILY 90 tablet 3   triamterene -hydrochlorothiazide (DYAZIDE) 37.5-25 MG capsule Take 1 each (1 capsule total) by mouth daily. 30 capsule 2   UNABLE TO FIND 1 capsule. Med Name: women's daily probiotic trunature     vitamin B-12 (CYANOCOBALAMIN ) 1000 MCG tablet Take 1,000 mcg by mouth daily.     No current facility-administered medications on file prior to visit.     Review of Systems  Gastrointestinal:  Negative for nausea.  Neurological:  Negative for dizziness, light-headedness and headaches.       Objective:   Vitals:   01/02/24 1037  BP: 130/72  Pulse: 70  Temp: 98.2 F (36.8 C)  SpO2: 94%   BP Readings from Last 3 Encounters:  01/02/24 130/72  12/12/23  132/82  10/31/23 138/82   Wt Readings from Last 3 Encounters:  01/02/24 136 lb (61.7 kg)  12/12/23 143 lb 3.2 oz (65 kg)  10/31/23 141 lb (64 kg)   Body mass index is 24.87 kg/m.    Physical Exam Constitutional:      General: She is not in acute distress.    Appearance: Normal appearance.  HENT:     Head: Normocephalic and atraumatic.  Eyes:     Conjunctiva/sclera: Conjunctivae normal.  Cardiovascular:     Rate and Rhythm: Normal rate and regular rhythm.     Heart sounds: Normal heart sounds.  Pulmonary:     Effort: Pulmonary effort is normal. No respiratory distress.     Breath sounds: Normal breath sounds. No wheezing.  Musculoskeletal:     Cervical back: Neck supple.     Right lower leg: No edema.     Left  lower leg: No edema.     Comments: No tenderness with palpation right arm.  No increased pain or achiness with movement of the arm.  Lymphadenopathy:     Cervical: No cervical adenopathy.  Skin:    General: Skin is warm and dry.     Findings: No rash.  Neurological:     Mental Status: She is alert. Mental status is at baseline.  Psychiatric:        Mood and Affect: Mood normal.        Behavior: Behavior normal.        Lab Results  Component Value Date   WBC 8.2 12/11/2022   HGB 15.2 (H) 12/11/2022   HCT 45.2 12/11/2022   PLT 299.0 12/11/2022   GLUCOSE 89 04/04/2023   CHOL 172 04/04/2023   TRIG 319.0 (H) 04/04/2023   HDL 59.00 04/04/2023   LDLDIRECT 94.0 04/04/2023   LDLCALC 68 02/09/2023   ALT 26 04/04/2023   AST 26 04/04/2023   NA 136 04/04/2023   K 4.0 04/04/2023   CL 102 04/04/2023   CREATININE 0.97 04/04/2023   BUN 22 04/04/2023   CO2 30 04/04/2023   TSH 1.51 12/11/2022     Assessment & Plan:    See Problem List for Assessment and Plan of chronic medical problems.

## 2024-01-02 ENCOUNTER — Ambulatory Visit: Admitting: Internal Medicine

## 2024-01-02 VITALS — BP 130/72 | HR 70 | Temp 98.2°F | Ht 62.0 in | Wt 136.0 lb

## 2024-01-02 DIAGNOSIS — I10 Essential (primary) hypertension: Secondary | ICD-10-CM

## 2024-01-02 DIAGNOSIS — G2581 Restless legs syndrome: Secondary | ICD-10-CM

## 2024-01-02 DIAGNOSIS — E782 Mixed hyperlipidemia: Secondary | ICD-10-CM | POA: Diagnosis not present

## 2024-01-02 DIAGNOSIS — N1831 Chronic kidney disease, stage 3a: Secondary | ICD-10-CM | POA: Diagnosis not present

## 2024-01-02 DIAGNOSIS — R7989 Other specified abnormal findings of blood chemistry: Secondary | ICD-10-CM

## 2024-01-02 DIAGNOSIS — M79601 Pain in right arm: Secondary | ICD-10-CM

## 2024-01-02 DIAGNOSIS — H8102 Meniere's disease, left ear: Secondary | ICD-10-CM

## 2024-01-02 LAB — VITAMIN D 25 HYDROXY (VIT D DEFICIENCY, FRACTURES): VITD: 30.61 ng/mL (ref 30.00–100.00)

## 2024-01-02 LAB — LIPID PANEL
Cholesterol: 176 mg/dL (ref 0–200)
HDL: 60.8 mg/dL (ref >39.00)
LDL Cholesterol: 73 mg/dL (ref 0–99)
NonHDL: 115.69
Total CHOL/HDL Ratio: 3
Triglycerides: 212 mg/dL — ABNORMAL HIGH (ref 0.0–149.0)
VLDL: 42.4 mg/dL — ABNORMAL HIGH (ref 0.0–40.0)

## 2024-01-02 LAB — TSH: TSH: 1.97 u[IU]/mL (ref 0.35–5.50)

## 2024-01-02 LAB — COMPREHENSIVE METABOLIC PANEL WITH GFR
ALT: 22 U/L (ref 0–35)
AST: 23 U/L (ref 0–37)
Albumin: 4.2 g/dL (ref 3.5–5.2)
Alkaline Phosphatase: 64 U/L (ref 39–117)
BUN: 19 mg/dL (ref 6–23)
CO2: 29 meq/L (ref 19–32)
Calcium: 9.4 mg/dL (ref 8.4–10.5)
Chloride: 101 meq/L (ref 96–112)
Creatinine, Ser: 1.08 mg/dL (ref 0.40–1.20)
GFR: 48.78 mL/min — ABNORMAL LOW (ref >60.00)
Glucose, Bld: 89 mg/dL (ref 70–99)
Potassium: 4 meq/L (ref 3.5–5.1)
Sodium: 139 meq/L (ref 135–145)
Total Bilirubin: 1 mg/dL (ref 0.2–1.2)
Total Protein: 6.8 g/dL (ref 6.0–8.3)

## 2024-01-02 LAB — CBC WITH DIFFERENTIAL/PLATELET
Basophils Absolute: 0.1 10*3/uL (ref 0.0–0.1)
Basophils Relative: 0.9 % (ref 0.0–3.0)
Eosinophils Absolute: 0.2 10*3/uL (ref 0.0–0.7)
Eosinophils Relative: 3.1 % (ref 0.0–5.0)
HCT: 44.2 % (ref 36.0–46.0)
Hemoglobin: 15.1 g/dL — ABNORMAL HIGH (ref 12.0–15.0)
Lymphocytes Relative: 30.9 % (ref 12.0–46.0)
Lymphs Abs: 1.8 10*3/uL (ref 0.7–4.0)
MCHC: 34.2 g/dL (ref 30.0–36.0)
MCV: 94.6 fl (ref 78.0–100.0)
Monocytes Absolute: 0.6 10*3/uL (ref 0.1–1.0)
Monocytes Relative: 10.7 % (ref 3.0–12.0)
Neutro Abs: 3.2 10*3/uL (ref 1.4–7.7)
Neutrophils Relative %: 54.4 % (ref 43.0–77.0)
Platelets: 267 10*3/uL (ref 150.0–400.0)
RBC: 4.67 Mil/uL (ref 3.87–5.11)
RDW: 12.8 % (ref 11.5–15.5)
WBC: 5.9 10*3/uL (ref 4.0–10.5)

## 2024-01-02 LAB — VITAMIN B12: Vitamin B-12: 565 pg/mL (ref 211–911)

## 2024-01-02 LAB — FERRITIN: Ferritin: 37.3 ng/mL (ref 10.0–291.0)

## 2024-01-02 NOTE — Assessment & Plan Note (Signed)
 Chronic CMP today We switch her blood pressure medication a few weeks ago secondary to Mnire's flare so need to recheck kidney function and potassium Currently taking Dyazide 37.5-25 mg daily

## 2024-01-02 NOTE — Assessment & Plan Note (Signed)
 New States it has been going on for a while, but forgot to mention it at her last visit It is mostly in the upper arm ache which is mild, but she does feel it in her lower arm as well No shoulder pain, neck pain No numbness/tingling Not worse with movement or certain activities No pain with palpation, no decreased sensation Deferred further evaluation At this point she will just monitor and if this persists or worsens can refer to orthopedics

## 2024-01-02 NOTE — Assessment & Plan Note (Addendum)
 Chronic Diagnosed years ago by Dr. Canary Ceo an appoint with him later this summer Has been asymptomatic for a while, until 3 weeks ago Restarted Dyazide 37.5-25 mg daily 3 weeks ago Symptoms have resolved Continue Dyazide 37.5-25 mg daily

## 2024-01-02 NOTE — Assessment & Plan Note (Signed)
 Chronic Blood pressure controlled CMP Losartan  changed to Dyazide 3 weeks ago secondary to Mnire's flare Continue dyazide 37.5-25 mg daily Monitor your BP at home

## 2024-01-03 ENCOUNTER — Ambulatory Visit: Payer: Self-pay | Admitting: Internal Medicine

## 2024-03-18 ENCOUNTER — Other Ambulatory Visit: Payer: Self-pay | Admitting: Internal Medicine

## 2024-03-27 ENCOUNTER — Other Ambulatory Visit: Payer: Self-pay | Admitting: Internal Medicine

## 2024-04-03 ENCOUNTER — Ambulatory Visit (INDEPENDENT_AMBULATORY_CARE_PROVIDER_SITE_OTHER): Payer: Medicare Other

## 2024-04-03 VITALS — Ht 62.0 in | Wt 136.0 lb

## 2024-04-03 DIAGNOSIS — Z1231 Encounter for screening mammogram for malignant neoplasm of breast: Secondary | ICD-10-CM

## 2024-04-03 DIAGNOSIS — Z0000 Encounter for general adult medical examination without abnormal findings: Secondary | ICD-10-CM | POA: Diagnosis not present

## 2024-04-03 NOTE — Addendum Note (Signed)
 Addended by: Yoshiko Keleher L on: 04/03/2024 02:15 PM   Modules accepted: Orders, Level of Service

## 2024-04-03 NOTE — Patient Instructions (Signed)
 Ruth Jones , Thank you for taking time out of your busy schedule to complete your Annual Wellness Visit with me. I enjoyed our conversation and look forward to speaking with you again next year. I, as well as your care team,  appreciate your ongoing commitment to your health goals. Please review the following plan we discussed and let me know if I can assist you in the future. Your Game plan/ To Do List    Referrals: If you haven't heard from the office you've been referred to, please reach out to them at the phone provided.  You have an order for:  [x]   3D Mammogram    Please call for appointment:  [facility] [address] [place], [place] [address] [phone]  Make sure to wear two-piece clothing.  No lotions, powders, or deodorants the day of the appointment. Make sure to bring picture ID and insurance card.  Bring list of medications you are currently taking including any supplements.    Follow up Visits: We will see or speak with you next year for your Next Medicare AWV with our clinical staff Have you seen your provider in the last 6 months (3 months if uncontrolled diabetes)? Yes.  Last office visit on 01/02/2024.  Clinician Recommendations:  Aim for 30 minutes of exercise or brisk walking, 6-8 glasses of water, and 5 servings of fruits and vegetables each day.       This is a list of the screenings recommended for you:  Health Maintenance  Topic Date Due   COVID-19 Vaccine (5 - 2024-25 season) 04/22/2023   Flu Shot  03/21/2024   DTaP/Tdap/Td vaccine (3 - Td or Tdap) 02/01/2025   Medicare Annual Wellness Visit  04/03/2025   DEXA scan (bone density measurement)  12/17/2025   Pneumococcal Vaccine for age over 50  Completed   Hepatitis C Screening  Completed   Zoster (Shingles) Vaccine  Completed   HPV Vaccine  Aged Out   Meningitis B Vaccine  Aged Out   Colon Cancer Screening  Discontinued    Advanced directives: (Copy Requested) Please  bring a copy of your health care power of attorney and living will to the office to be added to your chart at your convenience. You can mail to [facility] [address] [place], [place] [address] or email to [email or web address] Advance Care Planning is important because it:  [x]  Makes sure you receive the medical care that is consistent with your values, goals, and preferences  [x]  It provides guidance to your family and loved ones and reduces their decisional burden about whether or not they are making the right decisions based on your wishes.  Follow the link provided in your after visit summary or read over the paperwork we have mailed to you to help you started getting your Advance Directives in place. If you need assistance in completing these, please reach out to us  so that we can help you!  See attachments for Preventive Care and Fall Prevention Tips.

## 2024-04-03 NOTE — Progress Notes (Addendum)
 Subjective:   Ruth Jones is a 78 y.o. who presents for a Medicare Wellness preventive visit.  As a reminder, Annual Wellness Visits don't include a physical exam, and some assessments may be limited, especially if this visit is performed virtually. We may recommend an in-person follow-up visit with your provider if needed.  Visit Complete: Virtual I connected with  Alisa GORMAN Endo on 04/03/24 by a audio enabled telemedicine application and verified that I am speaking with the correct person using two identifiers.  Patient Location: Home  Provider Location: Office/Clinic  I discussed the limitations of evaluation and management by telemedicine. The patient expressed understanding and agreed to proceed.  Vital Signs: Because this visit was a virtual/telehealth visit, some criteria may be missing or patient reported. Any vitals not documented were not able to be obtained and vitals that have been documented are patient reported.  VideoDeclined- This patient declined Librarian, academic. Therefore the visit was completed with audio only.  Persons Participating in Visit: Patient.  AWV Questionnaire: No: Patient Medicare AWV questionnaire was not completed prior to this visit.  Cardiac Risk Factors include: advanced age (>37men, >45 women);hypertension;Other (see comment);dyslipidemia, Risk factor comments: CKD stage 3,     Objective:    Today's Vitals   04/03/24 1236  Weight: 136 lb (61.7 kg)  Height: 5' 2 (1.575 m)   Body mass index is 24.87 kg/m.     04/03/2024    1:55 PM 04/03/2023    2:06 PM 11/07/2021    2:43 PM 06/08/2020    9:15 AM 04/08/2018    1:35 PM  Advanced Directives  Does Patient Have a Medical Advance Directive? Yes Yes Yes Yes No   Type of Estate agent of [place];Living will Healthcare Power of [place];Living will Living will;Healthcare Power of Attorney    Does patient want to make changes to medical  advance directive?   No - Patient declined    Copy of Healthcare Power of Attorney in Chart? No - copy requested No - copy requested No - copy requested    Would patient like information on creating a medical advance directive?     Yes (ED - Information included in AVS)      Data saved with a previous flowsheet row definition    Current Medications (verified) Outpatient Encounter Medications as of 04/03/2024  Medication Sig   Bacillus Coagulans-Inulin (ALIGN PREBIOTIC-PROBIOTIC PO) Take by mouth.   Black Pepper-Turmeric (TURMERIC COMPLEX/BLACK PEPPER PO) Take 1,000 mg by mouth. 1 capsule per day   Calcium -Magnesium-Vitamin D  (CALCIUM  MAGNESIUM PO) Take by mouth.   doxycycline  (ADOXA) 75 MG tablet Take by mouth. rosea   estradiol (ESTRACE) 0.5 MG tablet Take 0.5 mg by mouth daily.   famotidine -calcium  carbonate-magnesium hydroxide (PEPCID  COMPLETE) 10-800-165 MG chewable tablet Chew 1 tablet by mouth daily as needed.   ferrous sulfate 325 (65 FE) MG tablet Take 325 mg by mouth daily. Take 1/2 daily   gabapentin  (NEURONTIN ) 100 MG capsule TAKE 1 CAPSULE(100 MG) BY MOUTH AT BEDTIME AS NEEDED FOR RESTLESS LEG SYNDROME   Homeopathic Products (LEG CRAMPS PO) Take by mouth. For restful leg   Homeopathic Products (THERAWORX RELIEF) FOAM Apply topically. Patient takes for restless legs   losartan  (COZAAR ) 25 MG tablet Take 25 mg by mouth daily.   metroNIDAZOLE (METROCREAM) 0.75 % cream Apply topically daily.   NONFORMULARY OR COMPOUNDED ITEM Relaxing Leg Cream  Rub on legs twice a day and as needed for RLS Disp  4 oz container   NONFORMULARY OR COMPOUNDED ITEM Homeopathic muscle cramp & spasm spray Spray onto legs twice daily and as needed Disp 7.1 fl oz   rOPINIRole  (REQUIP ) 1 MG tablet TAKE 1 TABLET(1 MG) BY MOUTH THREE TIMES DAILY   rosuvastatin  (CRESTOR ) 5 MG tablet TAKE 1 TABLET(5 MG) BY MOUTH DAILY   triamterene -hydrochlorothiazide (DYAZIDE) 37.5-25 MG capsule TAKE 1 CAPSULE BY MOUTH DAILY    UNABLE TO FIND 1 capsule. Med Name: women's daily probiotic trunature   vitamin B-12 (CYANOCOBALAMIN ) 1000 MCG tablet Take 1,000 mcg by mouth daily.   No facility-administered encounter medications on file as of 04/03/2024.    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   Snoring 01/17/2012   Past Surgical History:  Procedure Laterality Date   COLONOSCOPY W/ POLYPECTOMY     MOHS SURGERY  2016   melanoma RLE   UPPER GASTROINTESTINAL ENDOSCOPY  2011   Dr Abran   VESICOVAGINAL FISTULA CLOSURE W/ TAH  1990   Family History  Problem Relation Age of Onset   Heart disease Father    Osteoporosis Mother    Lymphoma Mother        non-hodgkins   Stroke Neg Hx    Diabetes Neg Hx    Colon cancer Neg Hx    Social History   Socioeconomic History   Marital status: Married    Spouse name: Octaviano   Number of children: 2   Years of education: 16   Highest education level: Bachelor's degree (e.g., BA, AB, BS)  Occupational History   Occupation: Retired    Comment: Runner, broadcasting/film/video  Tobacco Use   Smoking status: Never   Smokeless  tobacco: Never  Vaping Use   Vaping status: Never Used  Substance and Sexual Activity   Alcohol use: Yes    Alcohol/week: 7.0 standard drinks of alcohol    Types: 7 Glasses of wine per week    Comment: 1 glass of wine nightly   Drug use: No   Sexual activity: Not Currently  Other Topics Concern   Not on file  Social History Narrative   Right handed   Two story home   Drinks caffeine   Lives with husband/2025   Social Drivers of Health   Financial Resource Strain: Low Risk  (04/03/2024)   Overall Financial Resource Strain (CARDIA)    Difficulty of Paying Living Expenses: Not hard at all  Food Insecurity: No Food Insecurity (04/03/2024)   Hunger Vital Sign    Worried About Running Out of Food in the Last Year: Never true    Ran Out of Food in the Last Year: Never true  Transportation Needs: No Transportation Needs (04/03/2024)   PRAPARE - Administrator, Civil Service (Medical): No    Lack of Transportation (Non-Medical): No  Physical Activity: Sufficiently Active (04/03/2024)   Exercise Vital Sign    Days of Exercise per Week: 4 days    Minutes of Exercise per Session: 60 min  Stress: No Stress Concern Present (04/03/2024)   [employer] of Occupational Health - Occupational Stress Questionnaire    Feeling of Stress: Not at all  Social Connections: Socially Integrated (04/03/2024)   Social Connection and Isolation Panel    Frequency of Communication with Friends and Family: More than three times a week    Frequency of Social Gatherings with Friends and Family: More than three times a week    Attends Religious Services: More than 4 times per year    Active Member of [employer] or Organizations: Yes    Attends Banker Meetings: Never    Marital Status: Married    Tobacco Counseling Counseling given: Not Answered    Clinical Intake:  Pre-visit preparation completed: Yes  Pain : No/denies pain     BMI - recorded: 24.87 Nutritional Status: BMI  of 19-24  Normal Nutritional Risks: None Diabetes: No  No results found for: HGBA1C   How often do you need to have someone help you when you read instructions, pamphlets, or other written materials from your doctor or pharmacy?: 1 - Never  Interpreter Needed?: No  Information entered by :: Fatumata Kashani, RMA   Activities of Daily Living     04/03/2024   12:37 PM  In your present state of health, do you have any difficulty performing the following activities:  Hearing? 0  Vision? 0  Difficulty concentrating or making decisions? 0  Walking or climbing stairs? 0  Dressing or bathing? 0  Doing errands, shopping? 0  Preparing Food and eating ? N  Using the Toilet? N  In the past six months, have you accidently leaked urine? N  Do you have problems with loss of bowel control? N  Managing your Medications? N  Managing your Finances? N  Housekeeping or managing your Housekeeping? N    Patient Care Team: [name], MD as PCP - General (Internal Medicine) Fate Morna SAILOR, [facility] (Inactive) as Pharmacist (Pharmacist) Camillo Golas, MD as Consulting Physician (Ophthalmology)  I have updated your Care Teams any recent Medical Services you may have received from other providers in the past year.     Assessment:   This is a routine wellness examination for [facility].  Hearing/Vision screen Hearing Screening - Comments:: Denies hearing difficulties   Vision Screening - Comments:: Wears one contact lens/ [place]   Goals Addressed             This Visit's Progress    Patient Stated   On track    Maintain my health.       Depression Screen     04/03/2024    1:58 PM 01/02/2024   10:47 AM 12/12/2023    1:06 PM 04/04/2023    3:11 PM 04/03/2023    2:07 PM 12/11/2022   11:33 AM 11/07/2021    2:47 PM  PHQ 2/9 Scores  PHQ - 2 Score 0 0 1 0 0 0 0  PHQ- 9 Score 0 0 3  0 1     Fall Risk     04/03/2024    1:56 PM 01/02/2024   10:47 AM 12/12/2023    1:06 PM  04/04/2023     3:11 PM 04/03/2023    2:07 PM  Fall Risk   Falls in the past year? 0 0 0 0 0  Number falls in past yr: 0 0 0 0 0  Injury with Fall? 0 0 0 0 0  Risk for fall due to :  No Fall Risks No Fall Risks No Fall Risks No Fall Risks  Follow up Falls evaluation completed;Falls prevention discussed Falls evaluation completed Falls evaluation completed Falls evaluation completed Falls prevention discussed    MEDICARE RISK AT HOME:  Medicare Risk at Home Any stairs in or around the home?: Yes (2 story home) If so, are there any without handrails?: No Home free of loose throw rugs in walkways, pet beds, electrical cords, etc?: Yes Adequate lighting in your home to reduce risk of falls?: Yes Life alert?: No Use of a cane, walker or w/c?: No Grab bars in the bathroom?: No Shower chair or bench in shower?: No Elevated toilet seat or a handicapped toilet?: Yes  TIMED UP AND GO:  Was the test performed?  No  Cognitive Function: Declined/Normal: No cognitive concerns noted by patient or family. Patient alert, oriented, able to answer questions appropriately and recall recent events. No signs of memory loss or confusion.        04/03/2023    2:08 PM  6CIT Screen  What Year? 0 points  What month? 0 points  What time? 0 points  Count back from 20 0 points  Months in reverse 0 points  Repeat phrase 0 points  Total Score 0 points    Immunizations Immunization History  Administered Date(s) Administered   Fluad Quad(high Dose 65+) 04/14/2019   Influenza Whole 06/22/2011   Influenza, High Dose Seasonal PF 06/26/2013, 06/24/2015, 06/06/2016, 05/09/2018   Influenza,inj,Quad PF,6+ Mos 04/30/2014   Influenza-Unspecified 06/10/2017, 04/13/2018, 05/24/2020, 05/16/2021   Moderna Covid-19 Vaccine Bivalent Booster 57yrs & up 05/16/2021   Moderna SARS-COV2 Booster Vaccination 01/11/2021   Moderna Sars-Covid-2 Vaccination 09/03/2019, 10/01/2019, 06/28/2020   Pneumococcal Conjugate-13 07/28/2015    Pneumococcal Polysaccharide-23 08/02/2009   Td 08/22/2003   Tdap 02/02/2015   Zoster Recombinant(Shingrix) 01/14/2017, 06/27/2017   Zoster, Live 11/18/2007    Screening Tests Health Maintenance  Topic Date Due   COVID-19 Vaccine (5 - 2024-25 season) 04/22/2023   INFLUENZA VACCINE  03/21/2024   DTaP/Tdap/Td (3 - Td or Tdap) 02/01/2025   Medicare Annual Wellness (AWV)  04/03/2025   DEXA SCAN  12/17/2025   Pneumococcal Vaccine: 50+ Years  Completed   Hepatitis C Screening  Completed   Zoster Vaccines- Shingrix  Completed   HPV VACCINES  Aged Out   Meningococcal B Vaccine  Aged Out   Colonoscopy  Discontinued    Health Maintenance  Health Maintenance Due  Topic Date Due   COVID-19 Vaccine (5 - 2024-25 season) 04/22/2023   INFLUENZA VACCINE  03/21/2024   Health Maintenance Items Addressed: Mammogram ordered, See Nurse Notes at the end of this note  Additional Screening:  Vision Screening: Recommended annual ophthalmology exams for early detection of glaucoma and other disorders of the eye. Would you like a referral to an eye doctor? No    Dental Screening: Recommended annual dental exams for proper oral hygiene  Community Resource Referral / Chronic Care Management: CRR required this visit?  No   CCM required this visit?  No   Plan:    I have personally reviewed and noted the following in the patient's chart:   Medical and social  history Use of alcohol, tobacco or illicit drugs  Current medications and supplements including opioid prescriptions. Patient is not currently taking opioid prescriptions. Functional ability and status Nutritional status Physical activity Advanced directives List of other physicians Hospitalizations, surgeries, and ER visits in previous 12 months Vitals Screenings to include cognitive, depression, and falls Referrals and appointments  In addition, I have reviewed and discussed with patient certain preventive protocols, quality  metrics, and best practice recommendations. A written personalized care plan for preventive services as well as general preventive health recommendations were provided to patient.   Aldine Chakraborty L Javiana Anwar, CMA   04/03/2024   After Visit Summary: (Mail) Due to this being a telephonic visit, the after visit summary with patients personalized plan was offered to patient via mail   Notes: Patient is due for a mammogram and order has been placed today.  She had no concerns to address today.  Medical screening examination/treatment/procedure(s) were performed by non-physician practitioner and as supervising physician I was immediately available for consultation/collaboration.  I agree with above. Karlynn Noel, MD

## 2024-04-14 ENCOUNTER — Ambulatory Visit
Admission: RE | Admit: 2024-04-14 | Discharge: 2024-04-14 | Disposition: A | Discharge status: Home / Self Care | Source: Ambulatory Visit | Attending: Internal Medicine | Admitting: Internal Medicine

## 2024-04-14 DIAGNOSIS — Z1231 Encounter for screening mammogram for malignant neoplasm of breast: Secondary | ICD-10-CM

## 2024-04-15 ENCOUNTER — Ambulatory Visit: Admitting: Internal Medicine

## 2024-04-15 ENCOUNTER — Encounter: Payer: Self-pay | Admitting: Internal Medicine

## 2024-04-15 ENCOUNTER — Ambulatory Visit: Payer: Self-pay

## 2024-04-15 VITALS — BP 132/74 | HR 78 | Temp 98.6°F | Ht 62.0 in | Wt 141.0 lb

## 2024-04-15 DIAGNOSIS — Z1881 Retained glass fragments: Secondary | ICD-10-CM

## 2024-04-15 DIAGNOSIS — S61247A Puncture wound with foreign body of left little finger without damage to nail, initial encounter: Secondary | ICD-10-CM

## 2024-04-15 DIAGNOSIS — T148XXA Other injury of unspecified body region, initial encounter: Secondary | ICD-10-CM | POA: Insufficient documentation

## 2024-04-15 DIAGNOSIS — Z23 Encounter for immunization: Secondary | ICD-10-CM

## 2024-04-15 MED ORDER — DOXYCYCLINE HYCLATE 100 MG PO TABS: 100.0000 mg | ORAL_TABLET | Freq: Two times a day (BID) | ORAL | 0 refills | Status: AC

## 2024-04-15 NOTE — Assessment & Plan Note (Signed)
 Acute 1-2 weeks ago broke a glass and was picking up off the glass and she is concerned maybe she has a piece of glass retained in her finger because it has been swollen since then, slightly tender, red and is not getting better Agreed of probable foreign body Area numbed incision made and 0.5 cm piece of glass removed-see procedure note Start doxycycline  100 mg twice daily x 7 days Td given today She will monitor wound and call or return with any concerning symptoms of infection

## 2024-04-15 NOTE — Progress Notes (Signed)
 Subjective:    Patient ID: Ruth Jones, female    DOB: 10-31-1945, 78 y.o.   MRN: [id number]      HPI Ruth Jones is here for  Chief Complaint  Patient presents with   Hand Pain    Glass fell, was cleaning it up and feels like something is still in x 2 weeks ago; Left pinky   1 week ago, almost 2 weeks ago she dropped a glass and was cleaning it up and she thinks she got a piece of glass in her left index finger because since then she has a tender area of swelling in the finger that is not going down.  There is some mild redness surrounding it.  She is not able to bend the finger because of the swelling.  She denies any numbness or tingling.  She denies any fevers.     Medications and allergies reviewed with patient and updated if appropriate.  Current Outpatient Medications on File Prior to Visit  Medication Sig Dispense Refill   Bacillus Coagulans-Inulin (ALIGN PREBIOTIC-PROBIOTIC PO) Take by mouth.     Black Pepper-Turmeric (TURMERIC COMPLEX/BLACK PEPPER PO) Take 1,000 mg by mouth. 1 capsule per day     Calcium -Magnesium-Vitamin D  (CALCIUM  MAGNESIUM PO) Take by mouth.     doxycycline  (ADOXA) 75 MG tablet Take by mouth. rosea     estradiol (ESTRACE) 0.5 MG tablet Take 0.5 mg by mouth daily.     famotidine -calcium  carbonate-magnesium hydroxide (PEPCID  COMPLETE) 10-800-165 MG chewable tablet Chew 1 tablet by mouth daily as needed. 60 tablet    ferrous sulfate 325 (65 FE) MG tablet Take 325 mg by mouth daily. Take 1/2 daily     gabapentin  (NEURONTIN ) 100 MG capsule TAKE 1 CAPSULE(100 MG) BY MOUTH AT BEDTIME AS NEEDED FOR RESTLESS LEG SYNDROME 30 capsule 5   Homeopathic Products (LEG CRAMPS PO) Take by mouth. For restful leg     Homeopathic Products (THERAWORX RELIEF) FOAM Apply topically. Patient takes for restless legs     losartan  (COZAAR ) 25 MG tablet Take 25 mg by mouth daily.     metroNIDAZOLE (METROCREAM) 0.75 % cream Apply topically daily.     NONFORMULARY OR COMPOUNDED  ITEM Relaxing Leg Cream  Rub on legs twice a day and as needed for RLS Disp 4 oz container 1 each 2   NONFORMULARY OR COMPOUNDED ITEM Homeopathic muscle cramp & spasm spray Spray onto legs twice daily and as needed Disp 7.1 fl oz 1 each 2   rOPINIRole  (REQUIP ) 1 MG tablet TAKE 1 TABLET(1 MG) BY MOUTH THREE TIMES DAILY 270 tablet 3   rosuvastatin  (CRESTOR ) 5 MG tablet TAKE 1 TABLET(5 MG) BY MOUTH DAILY 90 tablet 3   triamterene -hydrochlorothiazide (DYAZIDE) 37.5-25 MG capsule TAKE 1 CAPSULE BY MOUTH DAILY 30 capsule 2   UNABLE TO FIND 1 capsule. Med Name: women's daily probiotic trunature     vitamin B-12 (CYANOCOBALAMIN ) 1000 MCG tablet Take 1,000 mcg by mouth daily.     No current facility-administered medications on file prior to visit.    Review of Systems     Objective:   Vitals:   04/15/24 1537  BP: 132/74  Pulse: 78  Temp: 98.6 F (37 C)  SpO2: 94%   BP Readings from Last 3 Encounters:  04/15/24 132/74  01/02/24 130/72  12/12/23 132/82   Wt Readings from Last 3 Encounters:  04/15/24 141 lb (64 kg)  04/03/24 136 lb (61.7 kg)  01/02/24 136 lb (61.7 kg)  Body mass index is 25.79 kg/m.    Physical Exam Constitutional:      General: She is not in acute distress.    Appearance: Normal appearance. She is not ill-appearing.  HENT:     Head: Normocephalic and atraumatic.  Skin:    General: Skin is warm and dry.     Comments: Left anterior pinky finger near PIP joint there is swelling, erythema with an area of what appears to be dried blood and possible pus.  There is no open wound.  Area is soft to palpation, slightly tender.  There is normal sensation.  There is no streaking of erythema up into the palm.  Neurological:     Mental Status: She is alert.       Foreign Body Removal  Date/Time: 04/15/2024 4:46 PM  Performed by: [name], MD Authorized by: [name], MD  Body area: skin General location: upper extremity Location details: left small  finger  Anesthesia: Local Anesthetic: topical anesthetic Anesthetic total: 2 mL  Sedation: Patient sedated: no  Patient restrained: no Patient cooperative: yes Removal mechanism: scalpel Dressing: antibiotic ointment and dressing applied Tendon involvement: none Depth: subcutaneous Complexity: simple 1 objects recovered. Objects recovered: glass 0.5 cm in length Post-procedure assessment: foreign body removed Patient tolerance: patient tolerated the procedure well with no immediate complications        Assessment & Plan:    See Problem List for Assessment and Plan of chronic medical problems.

## 2024-04-15 NOTE — Patient Instructions (Addendum)
      Tetanus vaccine given.        Medications changes include :   doxycycline  100 mg twice a day for 7 days.       Return if symptoms worsen or fail to improve.

## 2024-04-15 NOTE — Telephone Encounter (Signed)
 FYI Only or Action Required?: FYI only for provider.  Patient was last seen in primary care on 01/02/2024 by [name], MD.  Called Nurse Triage reporting Pain.  Symptoms began a week ago.  Interventions attempted: Nothing.  Symptoms are: gradually worsening.  Triage Disposition: See PCP When Office is Open (Within 3 Days)  Patient/caregiver understands and will follow disposition?: Yes   Copied from CRM #[id number]. Topic: Clinical - Red Word Triage >> Apr 15, 2024 10:02 AM [place] J wrote: [facility] that prompted transfer to Nurse Triage: Patient believes there is a piece of glass stuck in her finger. The skin has grown over the piece of glass and now her finger is swollen and in pain. Reason for Disposition  [1] MODERATE pain (e.g., interferes with normal activities) AND [2] present > 3 days  Answer Assessment - Initial Assessment Questions 1. ONSET: When did the pain start?      X week 2. LOCATION and RADIATION: Where is the pain located?  (e.g., fingertip, around nail, joint, entire      Left hand pinky finger 3. SEVERITY: How bad is the pain? What does it keep you from doing?   (Scale 1-10; or mild, moderate, severe)     Mild to moderate 4. APPEARANCE: What does the finger look like? (e.g., redness, swelling, bruising, pallor)     Red, white center, swollen,  5. WORK OR EXERCISE: Has there been any recent work or exercise that involved this part (i.e., fingers or hand) of the body?     no 6. CAUSE: What do you think is causing the pain?     Glass stuck in finger 7. AGGRAVATING FACTORS: What makes the pain worse? (e.g., using computer)     Trying to make a fist, etc 8. OTHER SYMPTOMS: Do you have any other symptoms? (e.g., fever, neck pain, numbness)     Tight/swollen 9. PREGNANCY: Is there any chance you are pregnant? When was your last menstrual period?     na  Protocols used: Finger Pain-A-AH

## 2024-04-23 DIAGNOSIS — K080 Exfoliation of teeth due to systemic causes: Secondary | ICD-10-CM | POA: Diagnosis not present

## 2024-04-28 ENCOUNTER — Telehealth: Payer: Self-pay

## 2024-04-28 NOTE — Telephone Encounter (Signed)
 Copied from CRM [id number]. Topic: General - Other >> Apr 28, 2024 10:43 AM Rosina BIRCH wrote: Reason for CRM: patient called stating she need a covid prescription for the vaccine

## 2024-05-15 DIAGNOSIS — H524 Presbyopia: Secondary | ICD-10-CM | POA: Diagnosis not present

## 2024-06-04 ENCOUNTER — Other Ambulatory Visit: Payer: Self-pay | Admitting: Internal Medicine

## 2024-06-09 DIAGNOSIS — G2581 Restless legs syndrome: Secondary | ICD-10-CM | POA: Diagnosis not present

## 2024-07-09 ENCOUNTER — Ambulatory Visit: Payer: Self-pay

## 2024-07-09 NOTE — Telephone Encounter (Signed)
  FYI Only or Action Required?: Action required by provider: update on patient condition.  Patient was last seen in primary care on 04/15/2024 by [name], MD.  Called Nurse Triage reporting Dizziness.  Symptoms began today.  Interventions attempted: Rest, hydration, or home remedies.  Symptoms are: unchanged.  Triage Disposition: Go to ED Now (or PCP Triage)  Patient/caregiver understands and will follow disposition?: No, refuses disposition Copied from CRM #[id number]. Topic: Clinical - Red Word Triage >> Jul 09, 2024  4:20 PM [place] B wrote: [facility] that prompted transfer to Nurse Triage: patient is having dizziness Reason for Disposition  SEVERE dizziness (e.g., unable to stand, requires support to walk, feels like passing out now)  Answer Assessment - Initial Assessment Questions 1. DESCRIPTION: Describe your dizziness.     Feels like things are going round and round in my head 2. LIGHTHEADED: Do you feel lightheaded? (e.g., somewhat faint, woozy, weak upon standing)     woozy 3. VERTIGO: Do you feel like either you or the room is spinning or tilting? (i.e., vertigo)      4. SEVERITY: How bad is it?  Do you feel like you are going to faint? Can you stand and walk?     Does not feel able to walk 5. ONSET:  When did the dizziness begin?     Today, 30 minutes before call, but also had two bouts of dizziness at the beach last week. Also happened several months ago and it eventually wore off 6. AGGRAVATING FACTORS: Does anything make it worse? (e.g., standing, change in head position)     Any movement, standing  8. CAUSE: What do you think is causing the dizziness? (e.g., decreased fluids or food, diarrhea, emotional distress, heat exposure, new medicine, sudden standing, vomiting; unknown)     unknown 9. RECURRENT SYMPTOM: Have you had dizziness before? If Yes, ask: When was the last time? What happened that time?     Last week 10. OTHER SYMPTOMS:  Do you have any other symptoms? (e.g., fever, chest pain, vomiting, diarrhea, bleeding)       denies  Protocols used: Dizziness - Lightheadedness-A-AH

## 2024-07-10 ENCOUNTER — Ambulatory Visit (INDEPENDENT_AMBULATORY_CARE_PROVIDER_SITE_OTHER): Admitting: Nurse Practitioner

## 2024-07-10 VITALS — BP 142/80 | HR 83 | Temp 97.9°F | Ht 62.0 in | Wt 141.0 lb

## 2024-07-10 DIAGNOSIS — R42 Dizziness and giddiness: Secondary | ICD-10-CM | POA: Diagnosis not present

## 2024-07-10 DIAGNOSIS — R011 Cardiac murmur, unspecified: Secondary | ICD-10-CM

## 2024-07-10 DIAGNOSIS — I10 Essential (primary) hypertension: Secondary | ICD-10-CM | POA: Diagnosis not present

## 2024-07-10 MED ORDER — FLUTICASONE PROPIONATE 50 MCG/ACT NA SUSP: 2.0000 | Freq: Every day | NASAL | 6 refills | Status: AC

## 2024-07-10 MED ORDER — CETIRIZINE HCL 10 MG PO TABS: 10.0000 mg | ORAL_TABLET | Freq: Every day | ORAL | 11 refills | Status: AC

## 2024-07-10 NOTE — Assessment & Plan Note (Signed)
  Essential hypertension Blood pressure slightly elevated today, previously better.  - Scheduled follow-up appointment with Doctor Burns in one month for blood pressure monitoring. - If patient is not experiencing improvement with flonase  and zyrtec patient was instructed to call office to be seen sooner. If she comes in for evaluation due to symptoms not improving and BP is still slightly elevated could try to increase losartan  dose.

## 2024-07-10 NOTE — Progress Notes (Addendum)
 Established Patient Office Visit  Subjective   Patient ID: Ruth Jones, female    DOB: 01/15/1946  Age: 78 y.o. MRN: [id number]  Chief Complaint  Patient presents with   Dizziness    Discussed the use of AI scribe software for clinical note transcription with the patient, who gave verbal consent to proceed.  History of Present Illness Ruth Jones is an 78 year old female with Meniere's disease who presents with dizziness.  Dizziness and disequilibrium - Intermittent dizziness over the past couple of weeks. Initially noticed it while traveling at the beach about 2 weeks ago.  - Described as disequilibrium rather than vertigo - No changes in hearing - No significant nausea - No visual changes - No weakness, sensory changes, or other neurological symptoms  Recent illness and travel history - No recent respiratory or sinus infections - No recent air travel - Returned from a two-week beach trip  Blood pressure management - Takes losartan  for blood pressure control - Blood pressure higher than usual during the visit       ROS: see HPI    Objective:     BP (!) 142/80   Pulse 83   Temp 97.9 F (36.6 C) (Temporal)   Ht 5' 2 (1.575 m)   Wt 141 lb (64 kg)   SpO2 97%   BMI 25.79 kg/m  BP Readings from Last 3 Encounters:  07/10/24 (!) 142/80  04/15/24 132/74  01/02/24 130/72   Wt Readings from Last 3 Encounters:  07/10/24 141 lb (64 kg)  04/15/24 141 lb (64 kg)  04/03/24 136 lb (61.7 kg)      Physical Exam Vitals reviewed.  Constitutional:      General: She is not in acute distress.    Appearance: Normal appearance.  HENT:     Head: Normocephalic and atraumatic.     Right Ear: Tympanic membrane, ear canal and external ear normal. Decreased hearing noted.     Left Ear: Ear canal and external ear normal. Decreased hearing noted. A middle ear effusion is present.  Neck:     Vascular: No carotid bruit.  Cardiovascular:     Rate and Rhythm: Normal  rate and regular rhythm.     Pulses: Normal pulses.     Heart sounds: Murmur heard.  Pulmonary:     Effort: Pulmonary effort is normal.     Breath sounds: Normal breath sounds.  Skin:    General: Skin is warm and dry.  Neurological:     General: No focal deficit present.     Mental Status: She is alert and oriented to person, place, and time.     Sensory: Sensation is intact.     Motor: Motor function is intact.     Gait: Gait is intact.  Psychiatric:        Mood and Affect: Mood normal.        Behavior: Behavior normal.        Judgment: Judgment normal.    EKG: NSR  No results found for any visits on 07/10/24.    The ASCVD Risk score (Arnett DK, et al., 2019) failed to calculate for the following reasons:   The 2019 ASCVD risk score is only valid for ages 70 to 40    Assessment & Plan:   Problem List Items Addressed This Visit       Cardiovascular and Mediastinum   Essential hypertension    Essential hypertension Blood pressure slightly elevated today, previously better.  -  Scheduled follow-up appointment with Doctor Geofm in one month for blood pressure monitoring. - If patient is not experiencing improvement with flonase  and zyrtec  patient was instructed to call office to be seen sooner. If she comes in for evaluation due to symptoms not improving and BP is still slightly elevated could try to increase losartan  dose.         Other   Murmur   Heart murmur, possible valvular disease Faint heart murmur possibly related to past rheumatic fever (patient reports having had rheumatic fever as a child). No recent echocardiogram. Murmur may indicate valvular disease contributing to dizziness. - Ordered ultrasound to assess for valvular disease.      Relevant Orders   ECHOCARDIOGRAM COMPLETE   Dizziness - Primary   Dizziness due to middle ear effusion Intermittent dizziness likely from middle ear effusion. No arrhythmia, orthostatic hypotension, or significant hearing  changes. Dizziness is intermittent, no other abnormal signs on neuro exam making stroke less likely. Patient does have faint murmur on exam, making potential for valvular disease a contributing factor to dizziness. - Prescribed Flonase  nasal spray, two sprays daily. - Prescribed Zyrtec , to be taken daily - Advised to use both treatments for one to two weeks. - Ordered ultrasound to evaluate for valvular disease. - Instructed to call if no improvement by middle of next week or if symptoms worsen by Monday. At which point additional work-up including thyroid  panel, metabolic panel, cbc may be checked.       Relevant Medications   fluticasone  (FLONASE ) 50 MCG/ACT nasal spray   cetirizine  (ZYRTEC ) 10 MG tablet   Other Relevant Orders   ECHOCARDIOGRAM COMPLETE   Assessment and Plan Assessment & Plan Dizziness due to middle ear effusion Intermittent dizziness likely from middle ear effusion. No arrhythmia, orthostatic hypotension, or significant hearing changes. Dizziness is intermittent, no other abnormal signs on neuro exam making stroke less likely. Patient does have faint murmur on exam, making potential for valvular disease a contributing factor to dizziness. - Prescribed Flonase  nasal spray, two sprays daily. - Prescribed Zyrtec , to be taken daily - Advised to use both treatments for one to two weeks. - Ordered ultrasound to evaluate for valvular disease. - Instructed to call if no improvement by middle of next week or if symptoms worsen by Monday. At which point additional work-up including thyroid  panel, metabolic panel, cbc may be checked.   Essential hypertension Blood pressure slightly elevated today, previously better.  - Scheduled follow-up appointment with Doctor Burns in one month for blood pressure monitoring. - If patient is not experiencing improvement with flonase  and zyrtec  patient was instructed to call office to be seen sooner. If she comes in for evaluation due to symptoms  not improving and BP is still slightly elevated could try to increase losartan  dose.   Heart murmur, possible valvular disease Faint heart murmur possibly related to past rheumatic fever (patient reports having had rheumatic fever as a child). No recent echocardiogram. Murmur may indicate valvular disease contributing to dizziness. - Ordered ultrasound to assess for valvular disease.    Return in about 1 month (around 08/09/2024).    Ruth FORBES Pereyra, NP

## 2024-07-10 NOTE — Assessment & Plan Note (Signed)
 Dizziness due to middle ear effusion Intermittent dizziness likely from middle ear effusion. No arrhythmia, orthostatic hypotension, or significant hearing changes. Dizziness is intermittent, no other abnormal signs on neuro exam making stroke less likely. Patient does have faint murmur on exam, making potential for valvular disease a contributing factor to dizziness. - Prescribed Flonase  nasal spray, two sprays daily. - Prescribed Zyrtec , to be taken daily - Advised to use both treatments for one to two weeks. - Ordered ultrasound to evaluate for valvular disease. - Instructed to call if no improvement by middle of next week or if symptoms worsen by Monday. At which point additional work-up including thyroid  panel, metabolic panel, cbc may be checked.

## 2024-07-10 NOTE — Patient Instructions (Addendum)
 Call office if symptoms are worse on Monday. If no improvement by Wed/Thur next week then call for further evaluation.

## 2024-07-10 NOTE — Assessment & Plan Note (Signed)
 Heart murmur, possible valvular disease Faint heart murmur possibly related to past rheumatic fever (patient reports having had rheumatic fever as a child). No recent echocardiogram. Murmur may indicate valvular disease contributing to dizziness. - Ordered ultrasound to assess for valvular disease.

## 2024-07-11 NOTE — Addendum Note (Signed)
 Addended by: LEAR, Lether Tesch P on: 07/11/2024 01:26 PM   Modules accepted: Orders

## 2024-07-12 ENCOUNTER — Other Ambulatory Visit: Payer: Self-pay | Admitting: Internal Medicine

## 2024-08-07 NOTE — Progress Notes (Unsigned)
 Subjective:    Patient ID: Ruth Jones, female    DOB: 12-28-1945, 78 y.o.   MRN: [id number]     HPI Ruth Jones is here for follow up of her chronic medical problems.  Dizziness -she saw Lauraine about 1 month ago for dizziness/disequilibrium.  Look like it might have been related to middle ear effusion.  She was advised Flonase , Zyrtec .  Echo ordered due to mild murmur to rule out valvular disease as a potential cause.  Medications and allergies reviewed with patient and updated if appropriate.  Medications Ordered Prior to Encounter[1]   Review of Systems     Objective:  There were no vitals filed for this visit. BP Readings from Last 3 Encounters:  07/10/24 (!) 142/80  04/15/24 132/74  01/02/24 130/72   Wt Readings from Last 3 Encounters:  07/10/24 141 lb (64 kg)  04/15/24 141 lb (64 kg)  04/03/24 136 lb (61.7 kg)   There is no height or weight on file to calculate BMI.    Physical Exam     Lab Results  Component Value Date   WBC 5.9 01/02/2024   HGB 15.1 (H) 01/02/2024   HCT 44.2 01/02/2024   PLT 267.0 01/02/2024   GLUCOSE 89 01/02/2024   CHOL 176 01/02/2024   TRIG 212.0 (H) 01/02/2024   HDL 60.80 01/02/2024   LDLDIRECT 94.0 04/04/2023   LDLCALC 73 01/02/2024   ALT 22 01/02/2024   AST 23 01/02/2024   NA 139 01/02/2024   K 4.0 01/02/2024   CL 101 01/02/2024   CREATININE 1.08 01/02/2024   BUN 19 01/02/2024   CO2 29 01/02/2024   TSH 1.97 01/02/2024     Assessment & Plan:    See Problem List for Assessment and Plan of chronic medical problems.       [1]  Current Outpatient Medications on File Prior to Visit  Medication Sig Dispense Refill   Bacillus Coagulans-Inulin (ALIGN PREBIOTIC-PROBIOTIC PO) Take by mouth.     Black Pepper-Turmeric (TURMERIC COMPLEX/BLACK PEPPER PO) Take 1,000 mg by mouth. 1 capsule per day     Calcium -Magnesium-Vitamin D  (CALCIUM  MAGNESIUM PO) Take by mouth.     cetirizine  (ZYRTEC ) 10 MG tablet Take 1 tablet (10 mg  total) by mouth daily. 30 tablet 11   doxycycline  (ADOXA) 75 MG tablet Take by mouth. rosea     estradiol (ESTRACE) 0.5 MG tablet Take 0.5 mg by mouth daily.     famotidine -calcium  carbonate-magnesium hydroxide (PEPCID  COMPLETE) 10-800-165 MG chewable tablet Chew 1 tablet by mouth daily as needed. 60 tablet    ferrous sulfate 325 (65 FE) MG tablet Take 325 mg by mouth daily. Take 1/2 daily     fluticasone  (FLONASE ) 50 MCG/ACT nasal spray Place 2 sprays into both nostrils daily. 16 g 6   gabapentin  (NEURONTIN ) 100 MG capsule TAKE 1 CAPSULE(100 MG) BY MOUTH AT BEDTIME AS NEEDED FOR RESTLESS LEG SYNDROME 30 capsule 5   Homeopathic Products (LEG CRAMPS PO) Take by mouth. For restful leg     Homeopathic Products (THERAWORX RELIEF) FOAM Apply topically. Patient takes for restless legs     losartan  (COZAAR ) 25 MG tablet Take 25 mg by mouth daily.     metroNIDAZOLE (METROCREAM) 0.75 % cream Apply topically daily.     NONFORMULARY OR COMPOUNDED ITEM Relaxing Leg Cream  Rub on legs twice a day and as needed for RLS Disp 4 oz container 1 each 2   NONFORMULARY OR COMPOUNDED ITEM Homeopathic muscle  cramp & spasm spray Spray onto legs twice daily and as needed Disp 7.1 fl oz 1 each 2   rOPINIRole  (REQUIP ) 1 MG tablet TAKE 1 TABLET(1 MG) BY MOUTH THREE TIMES DAILY 270 tablet 3   rosuvastatin  (CRESTOR ) 5 MG tablet TAKE 1 TABLET(5 MG) BY MOUTH DAILY 90 tablet 3   triamterene -hydrochlorothiazide (DYAZIDE) 37.5-25 MG capsule TAKE 1 CAPSULE BY MOUTH DAILY 90 capsule 1   UNABLE TO FIND 1 capsule. Med Name: women's daily probiotic trunature     vitamin B-12 (CYANOCOBALAMIN ) 1000 MCG tablet Take 1,000 mcg by mouth daily.     No current facility-administered medications on file prior to visit.

## 2024-08-08 ENCOUNTER — Encounter: Payer: Self-pay | Admitting: Internal Medicine

## 2024-08-08 ENCOUNTER — Ambulatory Visit: Admitting: Internal Medicine

## 2024-08-08 VITALS — BP 140/80 | HR 90 | Temp 98.5°F | Ht 62.0 in | Wt 144.0 lb

## 2024-08-08 DIAGNOSIS — G2581 Restless legs syndrome: Secondary | ICD-10-CM | POA: Diagnosis not present

## 2024-08-08 DIAGNOSIS — E7800 Pure hypercholesterolemia, unspecified: Secondary | ICD-10-CM | POA: Diagnosis not present

## 2024-08-08 DIAGNOSIS — N1831 Chronic kidney disease, stage 3a: Secondary | ICD-10-CM | POA: Diagnosis not present

## 2024-08-08 DIAGNOSIS — I10 Essential (primary) hypertension: Secondary | ICD-10-CM

## 2024-08-08 DIAGNOSIS — R42 Dizziness and giddiness: Secondary | ICD-10-CM

## 2024-08-08 DIAGNOSIS — R7989 Other specified abnormal findings of blood chemistry: Secondary | ICD-10-CM

## 2024-08-08 LAB — COMPREHENSIVE METABOLIC PANEL WITH GFR
ALT: 40 U/L — ABNORMAL HIGH (ref 3–35)
AST: 33 U/L (ref 5–37)
Albumin: 4.4 g/dL (ref 3.5–5.2)
Alkaline Phosphatase: 70 U/L (ref 39–117)
BUN: 19 mg/dL (ref 6–23)
CO2: 30 meq/L (ref 19–32)
Calcium: 9.4 mg/dL (ref 8.4–10.5)
Chloride: 100 meq/L (ref 96–112)
Creatinine, Ser: 1.03 mg/dL (ref 0.40–1.20)
GFR: 51.41 mL/min — ABNORMAL LOW
Glucose, Bld: 99 mg/dL (ref 70–99)
Potassium: 3.8 meq/L (ref 3.5–5.1)
Sodium: 138 meq/L (ref 135–145)
Total Bilirubin: 0.8 mg/dL (ref 0.2–1.2)
Total Protein: 7.1 g/dL (ref 6.0–8.3)

## 2024-08-08 LAB — CBC WITH DIFFERENTIAL/PLATELET
Basophils Absolute: 0 K/uL (ref 0.0–0.1)
Basophils Relative: 0.6 % (ref 0.0–3.0)
Eosinophils Absolute: 0.2 K/uL (ref 0.0–0.7)
Eosinophils Relative: 2.6 % (ref 0.0–5.0)
HCT: 44.6 % (ref 36.0–46.0)
Hemoglobin: 15.3 g/dL — ABNORMAL HIGH (ref 12.0–15.0)
Lymphocytes Relative: 23.3 % (ref 12.0–46.0)
Lymphs Abs: 1.7 K/uL (ref 0.7–4.0)
MCHC: 34.3 g/dL (ref 30.0–36.0)
MCV: 93.5 fl (ref 78.0–100.0)
Monocytes Absolute: 0.7 K/uL (ref 0.1–1.0)
Monocytes Relative: 8.9 % (ref 3.0–12.0)
Neutro Abs: 4.7 K/uL (ref 1.4–7.7)
Neutrophils Relative %: 64.6 % (ref 43.0–77.0)
Platelets: 284 K/uL (ref 150.0–400.0)
RBC: 4.77 Mil/uL (ref 3.87–5.11)
RDW: 13 % (ref 11.5–15.5)
WBC: 7.3 K/uL (ref 4.0–10.5)

## 2024-08-08 LAB — LIPID PANEL
Cholesterol: 185 mg/dL (ref 28–200)
HDL: 63 mg/dL
LDL Cholesterol: 57 mg/dL (ref 10–99)
NonHDL: 122.25
Total CHOL/HDL Ratio: 3
Triglycerides: 326 mg/dL — ABNORMAL HIGH (ref 10.0–149.0)
VLDL: 65.2 mg/dL — ABNORMAL HIGH (ref 0.0–40.0)

## 2024-08-08 NOTE — Assessment & Plan Note (Addendum)
 Chronic Blood pressure borderline controlled CMP Continue dyazide 37.5-25 mg daily Monitor your BP at home-discussed goal-if blood pressure not ideally controlled we may need to adjust her medication

## 2024-08-08 NOTE — Assessment & Plan Note (Signed)
 [facility] Presented 1 month ago with episode of dizziness related to position change She has had 1 episode since she was here a month ago that was very mild, but denies any other dizziness Likely BPPV Monitor and if she has recurrence she will let us  know which

## 2024-08-08 NOTE — Assessment & Plan Note (Signed)
 Chronic History of low B12 Check vitamin B12 level

## 2024-08-08 NOTE — Assessment & Plan Note (Signed)
 Chronic Regular exercise and healthy diet encouraged Check lipid panel, CMP continue crestor  5 mg daily

## 2024-08-08 NOTE — Assessment & Plan Note (Signed)
 Chronic Currently controlled Continue requip  2 mg nightly continue gabapentin  100 mg nightly Taking slow release iron daily Exercising regularly

## 2024-08-08 NOTE — Patient Instructions (Addendum)
" ° ° ° ° °  Blood work was ordered.      BP should be less than 140/90   Medications changes include :   None    Return in about 6 months (around 02/06/2025) for Physical Exam.  "

## 2024-08-08 NOTE — Assessment & Plan Note (Signed)
 Chronic CMP today Currently taking Dyazide 37.5-25 mg daily

## 2024-08-10 ENCOUNTER — Ambulatory Visit: Payer: Self-pay | Admitting: Internal Medicine

## 2024-08-20 ENCOUNTER — Ambulatory Visit (HOSPITAL_COMMUNITY)

## 2024-08-20 ENCOUNTER — Encounter (HOSPITAL_BASED_OUTPATIENT_CLINIC_OR_DEPARTMENT_OTHER): Payer: Self-pay | Admitting: Internal Medicine

## 2024-09-18 ENCOUNTER — Encounter: Payer: Self-pay | Admitting: Internal Medicine

## 2024-09-18 ENCOUNTER — Ambulatory Visit: Payer: Self-pay

## 2024-09-18 NOTE — Telephone Encounter (Signed)
 Appointment made for tomorrow at 2:40 pm.

## 2024-09-18 NOTE — Telephone Encounter (Signed)
 FYI Only or Action Required?: Action required by provider: request for appointment, clinical question for provider, and update on patient condition.  Patient was last seen in primary care on 08/08/2024 by [name], MD.  Called Nurse Triage reporting Dizziness and Headache.  Symptoms began several months ago.  Interventions attempted: Rest, hydration, or home remedies.  Symptoms are: unchanged.  Triage Disposition: See Physician Within 24 Hours  Patient/caregiver understands and will follow disposition?: No, wishes to speak with PCP   Message from Ruth Jones sent at 09/18/2024 12:18 PM EST  Reason for Triage: vertigo      Reason for Disposition  [1] MODERATE dizziness (e.g., interferes with normal activities) AND [2] has NOT been evaluated by doctor (or NP/PA) for this  (Exception: Dizziness caused by heat exposure, sudden standing, or poor fluid intake.)  Answer Assessment - Initial Assessment Questions Pt's husband, Octaviano and pt on speaker phone, contacted clinic to report dizziness every afternoon x 2 months with associated h/a. Octaviano states that every afternoon pt gets a h/a then becomes dizzy and has to lay down. Pt has hx of vertigo but not treated by PCP and no current medications. He states that several years ago when vacationing at the beach pt has symptoms and he worried about a stroke, pt was evaluated, CT head was negative and pt dx with vertigo. Pt was given a prescription but she does not take it and has not been evaluated by PCP for symptoms. He sates that pt is currently laying down, h/a present, no speak changes, no weakness in extremities, no facial changes. Pt states when she opens her eyes the room is swirling/moving to the right. Discussed appt asap with alternative provider this afternoon d/t symptoms being present but spouse became tearful stating he would prefer to only see PCP unless she recommended visit with her colleagues. Spouse requested a call back as he stated  he missed a call from the office earlier but no message was left, no note in pt's chart for reason for call. Requesting call back asap when staff back from lunch.      1. DESCRIPTION: Describe your dizziness.     Swirling, spinning to the right   2. LIGHTHEADED: Do you feel lightheaded? (e.g., somewhat faint, woozy, weak upon standing)     Woozy, dizzy   3. VERTIGO: Do you feel like either you or the room is spinning or tilting? (i.e., vertigo)     The room is spinning/ swirling   4. SEVERITY: How bad is it?  Do you feel like you are going to faint? Can you stand and walk?     Pt's husband states this happens every afternoon for the past 2 months  5. ONSET:  When did the dizziness begin?     X2 months ago; pt was previously dx with vertigo by a different provider when evaluated at the beach   6. AGGRAVATING FACTORS: Does anything make it worse? (e.g., standing, change in head position)     When pt opens her eyes   8. CAUSE: What do you think is causing the dizziness? (e.g., decreased fluids or food, diarrhea, emotional distress, heat exposure, new medicine, sudden standing, vomiting; unknown)     Suspects vertigo   9. RECURRENT SYMPTOM: Have you had dizziness before? If Yes, ask: When was the last time? What happened that time?     Yes; lays down and sips water   10. OTHER SYMPTOMS: Do you have any other symptoms? (e.g., fever,  chest pain, vomiting, diarrhea, bleeding)       H/a  Protocols used: Dizziness - Lightheadedness-A-AH

## 2024-09-18 NOTE — Progress Notes (Signed)
 "   Subjective:    Patient ID: Ruth Jones, female    DOB: Jul 26, 1946, 79 y.o.   MRN: [id number]      HPI Ruth Jones is here for  Chief Complaint  Patient presents with   Dizziness    Dizziness (feels like her head has cotton in it)    Little HA, dizzy -started yesterday  - yes wall moving.  Head feels stuffed and vague. Took advil    HA    Worse with head movements  Discussed the use of AI scribe software for clinical note transcription with the patient, who gave verbal consent to proceed.  History of Present Illness Ruth Jones is an 79 year old female who presents with dizziness and a sensation of head fullness.  She has been experiencing dizziness since yesterday, described as a sensation of the wall moving from left to right. Initially, the dizziness was severe enough to be noticeable while lying down, but it has since improved. She still feels as if her head is 'stuffed with cotton.'  Alongside the dizziness, she has a vague headache that began around the same time. The headache is not severe and has been somewhat alleviated by Advil, though it persists. She is aware of the headache but does not find it debilitating.  No associated nausea, sinus pain, nasal congestion, or fatigue. No numbness, tingling, or weakness. She notes a slight change in vision, which she attributes to the perception of the wall moving.  She recalls a similar episode about a year ago and has a history of migraines, although she cannot recall the last time she had a significant migraine. She has not experienced any aura with her migraines in the past.  She was diagnosed with meniere's disease years ago and is currently on Dyazide. She has not taken any new medications recently other than Advil for the headache.    Medications and allergies reviewed with patient and updated if appropriate.  Medications Ordered Prior to Encounter[1]  Review of Systems  Constitutional:  Negative for fatigue.   HENT:  Negative for congestion and sinus pressure.   Eyes:  Negative for visual disturbance.  Gastrointestinal:  Negative for nausea.  Neurological:  Positive for dizziness and headaches. Negative for weakness and numbness.       Objective:   Vitals:   09/19/24 1501  BP: (!) 146/80  Pulse: 75  Temp: 98 F (36.7 C)  SpO2: 97%   BP Readings from Last 3 Encounters:  09/19/24 (!) 146/80  08/08/24 (!) 140/80  07/10/24 (!) 142/80   Wt Readings from Last 3 Encounters:  09/19/24 145 lb (65.8 kg)  08/08/24 144 lb (65.3 kg)  07/10/24 141 lb (64 kg)   Body mass index is 26.52 kg/m.    Physical Exam Constitutional:      General: She is not in acute distress.    Appearance: Normal appearance. She is not ill-appearing.  HENT:     Head: Normocephalic and atraumatic.     Right Ear: Tympanic membrane, ear canal and external ear normal. There is no impacted cerumen.     Left Ear: Tympanic membrane, ear canal and external ear normal. There is no impacted cerumen.     Mouth/Throat:     Mouth: Mucous membranes are moist.     Pharynx: No posterior oropharyngeal erythema.  Eyes:     Conjunctiva/sclera: Conjunctivae normal.  Musculoskeletal:     Right lower leg: No edema.     Left lower leg: No  edema.  Skin:    General: Skin is warm and dry.  Neurological:     Mental Status: She is alert. Mental status is at baseline.     Cranial Nerves: No cranial nerve deficit.     Sensory: No sensory deficit.     Motor: No weakness.            Assessment & Plan:    See Problem List for Assessment and Plan of chronic medical problems.   Assessment and Plan Assessment & Plan Migraine variant with vertigo Intermittent dizziness and headache suggestive of migraine variant. No strong evidence for Meniere's disease or benign positional vertigo. - Prescribed Imitrex  (sumatriptan ) 50 mg for migraine. Take one when she gets home to see if it helps -Instructed to take at symptom onset and  monitor response for future episodes. - Prescribed meclizine  for vertigo if Imitrex  ineffective. Use as needed, cautioning drowsiness. - Advised to report medication efficacy for further management. - Consider neurology referral if episodes persist or recur.        [1]  Current Outpatient Medications on File Prior to Visit  Medication Sig Dispense Refill   Bacillus Coagulans-Inulin (ALIGN PREBIOTIC-PROBIOTIC PO) Take by mouth.     Black Pepper-Turmeric (TURMERIC COMPLEX/BLACK PEPPER PO) Take 1,000 mg by mouth. 1 capsule per day     Calcium -Magnesium-Vitamin D  (CALCIUM  MAGNESIUM PO) Take by mouth.     cetirizine  (ZYRTEC ) 10 MG tablet Take 1 tablet (10 mg total) by mouth daily. 30 tablet 11   doxycycline  (ADOXA) 75 MG tablet Take by mouth. rosea     estradiol (ESTRACE) 0.5 MG tablet Take 0.5 mg by mouth daily.     famotidine -calcium  carbonate-magnesium hydroxide (PEPCID  COMPLETE) 10-800-165 MG chewable tablet Chew 1 tablet by mouth daily as needed. 60 tablet    ferrous sulfate 325 (65 FE) MG tablet Take 325 mg by mouth daily. Take 1/2 daily     fluticasone  (FLONASE ) 50 MCG/ACT nasal spray Place 2 sprays into both nostrils daily. 16 g 6   gabapentin  (NEURONTIN ) 100 MG capsule TAKE 1 CAPSULE(100 MG) BY MOUTH AT BEDTIME AS NEEDED FOR RESTLESS LEG SYNDROME 30 capsule 5   Homeopathic Products (LEG CRAMPS PO) Take by mouth. For restful leg     Homeopathic Products (THERAWORX RELIEF) FOAM Apply topically. Patient takes for restless legs     losartan  (COZAAR ) 25 MG tablet Take 25 mg by mouth daily.     metroNIDAZOLE (METROCREAM) 0.75 % cream Apply topically daily.     NONFORMULARY OR COMPOUNDED ITEM Relaxing Leg Cream  Rub on legs twice a day and as needed for RLS Disp 4 oz container 1 each 2   NONFORMULARY OR COMPOUNDED ITEM Homeopathic muscle cramp & spasm spray Spray onto legs twice daily and as needed Disp 7.1 fl oz 1 each 2   rOPINIRole  (REQUIP ) 1 MG tablet TAKE 1 TABLET(1 MG) BY MOUTH  THREE TIMES DAILY 270 tablet 3   rosuvastatin  (CRESTOR ) 5 MG tablet TAKE 1 TABLET(5 MG) BY MOUTH DAILY 90 tablet 3   triamterene -hydrochlorothiazide (DYAZIDE) 37.5-25 MG capsule TAKE 1 CAPSULE BY MOUTH DAILY 90 capsule 1   UNABLE TO FIND 1 capsule. Med Name: women's daily probiotic trunature     vitamin B-12 (CYANOCOBALAMIN ) 1000 MCG tablet Take 1,000 mcg by mouth daily.     No current facility-administered medications on file prior to visit.   "

## 2024-09-19 ENCOUNTER — Ambulatory Visit: Admitting: Internal Medicine

## 2024-09-19 VITALS — BP 138/70 | HR 75 | Temp 98.0°F | Ht 62.0 in | Wt 145.0 lb

## 2024-09-19 DIAGNOSIS — I10 Essential (primary) hypertension: Secondary | ICD-10-CM | POA: Diagnosis not present

## 2024-09-19 DIAGNOSIS — H8102 Meniere's disease, left ear: Secondary | ICD-10-CM | POA: Diagnosis not present

## 2024-09-19 DIAGNOSIS — G43109 Migraine with aura, not intractable, without status migrainosus: Secondary | ICD-10-CM | POA: Diagnosis not present

## 2024-09-19 MED ORDER — MECLIZINE HCL 12.5 MG PO TABS: 12.5000 mg | ORAL_TABLET | Freq: Three times a day (TID) | ORAL | 0 refills | Status: AC | PRN

## 2024-09-19 MED ORDER — SUMATRIPTAN SUCCINATE 50 MG PO TABS: 50.0000 mg | ORAL_TABLET | ORAL | 0 refills | Status: AC | PRN

## 2024-09-19 NOTE — Assessment & Plan Note (Signed)
 Chronic Blood pressure borderline controlled No changes since she is not feeling well today Continue dyazide 37.5-25 mg daily

## 2024-09-19 NOTE — Assessment & Plan Note (Signed)
 Acute Yesterday started having vertigo-when she laid down it felt like the wall was moving Since then she has not had that sensation-more of an unbalanced sensation Has a headache-not sure if the headache started before or after the vertigo started Take Advil and it helped slightly with the headache, but it is still there History of migraines Vertigo not typically related to head movements Possible migraines with vertigo Start Imitrex  50 mg as needed for migraine, can repeat every 2 hours.  Advised her to try this when she gets home to see if that helps Will also get of meclizine  2.5 mg daily for her to try to see if that is effective-discussed possible side effect of drowsiness

## 2024-09-19 NOTE — Assessment & Plan Note (Signed)
 Chronic Diagnosed years ago by Dr. Thaddeus Frank started having vertigo I do not think this episodes is meniere's disease - seems to fit more with possible migraine Continue Dyazide 37.5-25 mg daily

## 2024-09-19 NOTE — Patient Instructions (Addendum)
" °  This may be a migraine with vertigo, benign positional vertigo or the meniere's   Medications changes include :   sumatriptan  (imitrex ) as needed for migraine with vertigo.   Try this when you get home.    Meclizine  12.5 mg as needed for dizziness.   Use this if the imitrex  does not work.     Both can cause drowsiness      Return if symptoms worsen or fail to improve.  "

## 2024-09-23 ENCOUNTER — Ambulatory Visit: Payer: Self-pay

## 2024-09-23 NOTE — Telephone Encounter (Signed)
 Noted.

## 2025-02-06 ENCOUNTER — Ambulatory Visit: Admitting: Internal Medicine

## 2025-04-06 ENCOUNTER — Ambulatory Visit
